# Patient Record
Sex: Male | Born: 1945 | Race: White | Hispanic: No | Marital: Married | Smoking: Never smoker
Health system: Southern US, Community
[De-identification: ages and names within clinical notes are randomized; demographics above are authoritative.]

## PROBLEM LIST (undated history)

## (undated) DIAGNOSIS — IMO0001 Reserved for inherently not codable concepts without codable children: Secondary | ICD-10-CM

## (undated) DIAGNOSIS — T7840XA Allergy, unspecified, initial encounter: Secondary | ICD-10-CM

## (undated) DIAGNOSIS — I10 Essential (primary) hypertension: Secondary | ICD-10-CM

## (undated) DIAGNOSIS — N50819 Testicular pain, unspecified: Secondary | ICD-10-CM

## (undated) DIAGNOSIS — Z789 Other specified health status: Secondary | ICD-10-CM

## (undated) DIAGNOSIS — M1990 Unspecified osteoarthritis, unspecified site: Secondary | ICD-10-CM

## (undated) DIAGNOSIS — K5790 Diverticulosis of intestine, part unspecified, without perforation or abscess without bleeding: Secondary | ICD-10-CM

## (undated) DIAGNOSIS — Z9289 Personal history of other medical treatment: Secondary | ICD-10-CM

## (undated) DIAGNOSIS — K760 Fatty (change of) liver, not elsewhere classified: Secondary | ICD-10-CM

## (undated) DIAGNOSIS — E785 Hyperlipidemia, unspecified: Secondary | ICD-10-CM

## (undated) DIAGNOSIS — R935 Abnormal findings on diagnostic imaging of other abdominal regions, including retroperitoneum: Secondary | ICD-10-CM

## (undated) DIAGNOSIS — R9389 Abnormal findings on diagnostic imaging of other specified body structures: Secondary | ICD-10-CM

## (undated) DIAGNOSIS — I358 Other nonrheumatic aortic valve disorders: Secondary | ICD-10-CM

## (undated) HISTORY — DX: Hyperlipidemia, unspecified: E78.5

## (undated) HISTORY — DX: Diverticulosis of intestine, part unspecified, without perforation or abscess without bleeding: K57.90

## (undated) HISTORY — DX: Other specified health status: Z78.9

## (undated) HISTORY — DX: Essential (primary) hypertension: I10

## (undated) HISTORY — PX: SHOULDER SURGERY: SHX246

## (undated) HISTORY — DX: Other nonrheumatic aortic valve disorders: I35.8

## (undated) HISTORY — DX: Allergy, unspecified, initial encounter: T78.40XA

## (undated) HISTORY — DX: Unspecified osteoarthritis, unspecified site: M19.90

## (undated) HISTORY — DX: Abnormal findings on diagnostic imaging of other specified body structures: R93.89

## (undated) HISTORY — DX: Testicular pain, unspecified: N50.819

## (undated) HISTORY — DX: Reserved for inherently not codable concepts without codable children: IMO0001

## (undated) HISTORY — DX: Fatty (change of) liver, not elsewhere classified: K76.0

## (undated) HISTORY — DX: Abnormal findings on diagnostic imaging of other abdominal regions, including retroperitoneum: R93.5

## (undated) HISTORY — DX: Personal history of other medical treatment: Z92.89

---

## 1993-03-01 DIAGNOSIS — D239 Other benign neoplasm of skin, unspecified: Secondary | ICD-10-CM

## 1993-03-01 HISTORY — DX: Other benign neoplasm of skin, unspecified: D23.9

## 1998-03-03 ENCOUNTER — Ambulatory Visit (HOSPITAL_COMMUNITY): Admission: RE | Admit: 1998-03-03 | Discharge: 1998-03-03 | Payer: Self-pay | Admitting: Family Medicine

## 1998-04-16 ENCOUNTER — Ambulatory Visit (HOSPITAL_COMMUNITY): Admission: RE | Admit: 1998-04-16 | Discharge: 1998-04-16 | Payer: Self-pay | Admitting: Family Medicine

## 1998-06-02 ENCOUNTER — Ambulatory Visit (HOSPITAL_COMMUNITY): Admission: RE | Admit: 1998-06-02 | Discharge: 1998-06-02 | Payer: Self-pay | Admitting: Family Medicine

## 2000-11-16 ENCOUNTER — Ambulatory Visit (HOSPITAL_COMMUNITY): Admission: RE | Admit: 2000-11-16 | Discharge: 2000-11-16 | Payer: Self-pay | Admitting: Pediatrics

## 2001-12-28 ENCOUNTER — Ambulatory Visit (HOSPITAL_BASED_OUTPATIENT_CLINIC_OR_DEPARTMENT_OTHER): Admission: RE | Admit: 2001-12-28 | Discharge: 2001-12-28 | Payer: Self-pay | Admitting: Orthopedic Surgery

## 2002-11-28 HISTORY — PX: HAND SURGERY: SHX662

## 2002-12-02 ENCOUNTER — Encounter: Payer: Self-pay | Admitting: Orthopedic Surgery

## 2002-12-02 ENCOUNTER — Ambulatory Visit (HOSPITAL_BASED_OUTPATIENT_CLINIC_OR_DEPARTMENT_OTHER): Admission: RE | Admit: 2002-12-02 | Discharge: 2002-12-02 | Payer: Self-pay | Admitting: Orthopedic Surgery

## 2002-12-02 ENCOUNTER — Encounter: Admission: RE | Admit: 2002-12-02 | Discharge: 2002-12-02 | Payer: Self-pay | Admitting: Orthopedic Surgery

## 2003-04-15 ENCOUNTER — Encounter: Payer: Self-pay | Admitting: Internal Medicine

## 2003-11-29 HISTORY — PX: NOSE SURGERY: SHX723

## 2004-05-14 ENCOUNTER — Ambulatory Visit (HOSPITAL_COMMUNITY): Admission: RE | Admit: 2004-05-14 | Discharge: 2004-05-14 | Payer: Self-pay | Admitting: Orthopedic Surgery

## 2004-05-14 ENCOUNTER — Encounter (INDEPENDENT_AMBULATORY_CARE_PROVIDER_SITE_OTHER): Payer: Self-pay | Admitting: Specialist

## 2004-05-14 ENCOUNTER — Ambulatory Visit (HOSPITAL_BASED_OUTPATIENT_CLINIC_OR_DEPARTMENT_OTHER): Admission: RE | Admit: 2004-05-14 | Discharge: 2004-05-14 | Payer: Self-pay | Admitting: Orthopedic Surgery

## 2005-06-09 ENCOUNTER — Emergency Department (HOSPITAL_COMMUNITY): Admission: EM | Admit: 2005-06-09 | Discharge: 2005-06-10 | Payer: Self-pay | Admitting: Emergency Medicine

## 2007-03-01 ENCOUNTER — Inpatient Hospital Stay (HOSPITAL_COMMUNITY): Admission: EM | Admit: 2007-03-01 | Discharge: 2007-03-02 | Payer: Self-pay | Admitting: Emergency Medicine

## 2007-03-01 ENCOUNTER — Encounter (INDEPENDENT_AMBULATORY_CARE_PROVIDER_SITE_OTHER): Payer: Self-pay | Admitting: Cardiology

## 2007-03-01 ENCOUNTER — Ambulatory Visit: Payer: Self-pay | Admitting: Cardiology

## 2007-03-01 ENCOUNTER — Encounter: Payer: Self-pay | Admitting: Vascular Surgery

## 2007-03-01 ENCOUNTER — Ambulatory Visit: Payer: Self-pay | Admitting: Vascular Surgery

## 2007-03-01 DIAGNOSIS — R9389 Abnormal findings on diagnostic imaging of other specified body structures: Secondary | ICD-10-CM

## 2007-03-01 HISTORY — DX: Abnormal findings on diagnostic imaging of other specified body structures: R93.89

## 2007-03-02 DIAGNOSIS — IMO0001 Reserved for inherently not codable concepts without codable children: Secondary | ICD-10-CM

## 2007-03-02 HISTORY — DX: Reserved for inherently not codable concepts without codable children: IMO0001

## 2007-07-12 ENCOUNTER — Ambulatory Visit (HOSPITAL_BASED_OUTPATIENT_CLINIC_OR_DEPARTMENT_OTHER): Admission: RE | Admit: 2007-07-12 | Discharge: 2007-07-12 | Payer: Self-pay | Admitting: Orthopedic Surgery

## 2008-10-10 ENCOUNTER — Ambulatory Visit: Payer: Self-pay | Admitting: Internal Medicine

## 2008-10-21 ENCOUNTER — Ambulatory Visit: Payer: Self-pay | Admitting: Internal Medicine

## 2008-10-21 HISTORY — PX: COLONOSCOPY: SHX174

## 2008-10-21 LAB — HM COLONOSCOPY: HM Colonoscopy: NORMAL

## 2009-12-08 ENCOUNTER — Ambulatory Visit: Payer: Self-pay | Admitting: Family Medicine

## 2009-12-09 ENCOUNTER — Encounter (INDEPENDENT_AMBULATORY_CARE_PROVIDER_SITE_OTHER): Payer: Self-pay | Admitting: *Deleted

## 2009-12-09 ENCOUNTER — Encounter: Admission: RE | Admit: 2009-12-09 | Discharge: 2009-12-09 | Payer: Self-pay | Admitting: Family Medicine

## 2009-12-09 DIAGNOSIS — R935 Abnormal findings on diagnostic imaging of other abdominal regions, including retroperitoneum: Secondary | ICD-10-CM

## 2009-12-09 HISTORY — DX: Abnormal findings on diagnostic imaging of other abdominal regions, including retroperitoneum: R93.5

## 2009-12-11 ENCOUNTER — Ambulatory Visit: Payer: Self-pay | Admitting: Family Medicine

## 2010-01-06 ENCOUNTER — Encounter (INDEPENDENT_AMBULATORY_CARE_PROVIDER_SITE_OTHER): Payer: Self-pay | Admitting: *Deleted

## 2010-01-08 DIAGNOSIS — N50819 Testicular pain, unspecified: Secondary | ICD-10-CM

## 2010-01-08 HISTORY — DX: Testicular pain, unspecified: N50.819

## 2010-02-01 ENCOUNTER — Ambulatory Visit: Payer: Self-pay | Admitting: Internal Medicine

## 2010-02-01 DIAGNOSIS — R142 Eructation: Secondary | ICD-10-CM

## 2010-02-01 DIAGNOSIS — R143 Flatulence: Secondary | ICD-10-CM

## 2010-02-01 DIAGNOSIS — R141 Gas pain: Secondary | ICD-10-CM | POA: Insufficient documentation

## 2010-02-01 DIAGNOSIS — R1031 Right lower quadrant pain: Secondary | ICD-10-CM | POA: Insufficient documentation

## 2010-02-02 ENCOUNTER — Ambulatory Visit: Payer: Self-pay | Admitting: Cardiology

## 2010-02-02 LAB — CONVERTED CEMR LAB
BUN: 17 mg/dL (ref 6–23)
Basophils Absolute: 0.1 10*3/uL (ref 0.0–0.1)
Basophils Relative: 0.9 % (ref 0.0–3.0)
Creatinine, Ser: 1.1 mg/dL (ref 0.4–1.5)
Eosinophils Absolute: 0.2 10*3/uL (ref 0.0–0.7)
GFR calc non Af Amer: 72.11 mL/min (ref >60)
MCHC: 33.4 g/dL (ref 30.0–36.0)
MCV: 95.4 fL (ref 78.0–100.0)
Monocytes Absolute: 0.6 10*3/uL (ref 0.1–1.0)
Neutro Abs: 2.9 10*3/uL (ref 1.4–7.7)
Neutrophils Relative %: 50.1 % (ref 43.0–77.0)
PSA: 1.95 ng/mL (ref 0.10–4.00)
Potassium: 4.3 meq/L (ref 3.5–5.1)
RBC: 4.83 M/uL (ref 4.22–5.81)
RDW: 12.2 % (ref 11.5–14.6)
TSH: 2.48 microintl units/mL (ref 0.35–5.50)
Total Bilirubin: 0.3 mg/dL (ref 0.3–1.2)

## 2010-02-16 ENCOUNTER — Ambulatory Visit: Payer: Self-pay | Admitting: Family Medicine

## 2010-04-02 ENCOUNTER — Ambulatory Visit: Payer: Self-pay | Admitting: Family Medicine

## 2010-08-12 ENCOUNTER — Ambulatory Visit: Payer: Self-pay | Admitting: Family Medicine

## 2010-08-27 ENCOUNTER — Ambulatory Visit: Payer: Self-pay | Admitting: Family Medicine

## 2010-09-16 ENCOUNTER — Ambulatory Visit: Payer: Self-pay | Admitting: Family Medicine

## 2010-10-20 ENCOUNTER — Ambulatory Visit: Payer: Self-pay | Admitting: Family Medicine

## 2010-12-28 NOTE — Letter (Signed)
Summary: New Patient letter  [name]  [address] [place], [place] [address]   Phone: [phone]  Fax: [phone]       01/06/2010 MRN: [id number]  [name] [address] [place], [place]  [address]  Dear Daniel [name],  Welcome to the Gastroenterology Division at [facility].    You are scheduled to see Dr.  Marina [name] on 02-01-10 at 1:45PM on the 3rd floor at [name], [address]  We ask that you try to arrive at our office 15 minutes prior to your appointment time to allow for check-in.  We would like you to complete the enclosed self-administered evaluation form prior to your visit and bring it with you on the day of your appointment.  We will review it with you.  Also, please bring a complete list of all your medications or, if you prefer, bring the medication bottles and we will list them.  Please bring your insurance card so that we may make a copy of it.  If your insurance requires a referral to see a specialist, please bring your referral form from your primary care physician.  Co-payments are due at the time of your visit and may be paid by cash, check or credit card.     Your office visit will consist of a consult with your physician (includes a physical exam), any laboratory testing he/she may order, scheduling of any necessary diagnostic testing (e.g. x-ray, ultrasound, CT-scan), and scheduling of a procedure (e.g. Endoscopy, Colonoscopy) if required.  Please allow enough time on your schedule to allow for any/all of these possibilities.    If you cannot keep your appointment, please call [phone] to cancel or reschedule prior to your appointment date.  This allows [place] the opportunity to schedule an appointment for another patient in need of care.  If you do not cancel or reschedule by 5 p.m. the business day prior to your appointment date, you will be charged a $50.00 late cancellation/no-show fee.    Thank you for choosing [facility] for your medical needs.  We appreciate the opportunity to care for you.  Please visit [place] at our website  to learn more about our practice.                     Sincerely,                                                             The Gastroenterology Division

## 2010-12-28 NOTE — Assessment & Plan Note (Signed)
Summary: SOME ABD PAIN/YF   History of Present Illness Visit Type: Initial Consult Primary GI MD: Yancey Flemings MD Primary Provider: Sharlot Gowda, MD Requesting Provider: Sharlot Gowda, MD Chief Complaint: Lower abdominal Pain History of Present Illness:   65 year old white male with hypertension, hyperlipidemia, atrial fibrillation, and osteoarthritis. He was last seen in November of 2009 for surveillance colonoscopy. This was negative except for diverticulosis. He presents today with a new complaint, specifically, lower abdominal pain. He has no accompanying records. He tells me that the symptoms began approximately 3 months ago. He describes lower abdominal discomfort particularly on the right. This is most prominent when bending over. He describes a knot -like sensation as well as discomfort in the right testicle. He tells me that a urinalysis was negative. He was treated empirically with ciprofloxacin for one month. This seemed to help. Subsequently had some left-sided discomfort as well as left testicular discomfort. This has resolved. He was seen by urologist who did not feel that the problem was urologic. Question of referred pain from the back. Blocks again was prescribed but this did not help. He denies any change in his bowel habits, any effect of defecation on the discomfort, bleeding, or weight loss. He continues to notice that the discomfort will occur with certain bending her sitting positions and that it is immediately relieved with changing position. Following, in the past month, he has noticed progressive bloating toward the end of the day. His chronic medical problems are stable.Marland Kitchen   GI Review of Systems    Reports abdominal pain, belching, and  bloating.     Location of  Abdominal pain: lower abdomen.    Denies acid reflux, chest pain, dysphagia with liquids, dysphagia with solids, heartburn, loss of appetite, nausea, vomiting, vomiting blood, weight loss, and  weight gain.     Reports diverticulosis.     Denies anal fissure, black tarry stools, change in bowel habit, constipation, diarrhea, fecal incontinence, heme positive stool, hemorrhoids, irritable bowel syndrome, jaundice, light color stool, liver problems, rectal bleeding, and  rectal pain. Preventive Screening-Counseling & Management  Alcohol-Tobacco     Smoking Status: never      Drug Use:  no.      Current Medications (verified): 1)  Toprol Xl 25 Mg Xr24h-Tab (Metoprolol Succinate) .Marland Kitchen.. 1 Tablet By Mouth Once Daily 2)  Aspirin 81 Mg Tbec (Aspirin) .Marland Kitchen.. 1 Tablet By Mouth Once Daily 3)  Welchol 625 Mg Tabs (Colesevelam Hcl) .... 6 Tablets By Mouth Once Daily 4)  Zetia 10 Mg Tabs (Ezetimibe) .Marland Kitchen.. 1 Tablet By Mouth Once Daily  Allergies (verified): 1)  Codeine  Past History:  Past Medical History: Diverticulosis Hypertension Arthritis Atrial Fibrillation Hyperlipidemia  Past Surgical History: Reviewed history from 01/28/2010 and no changes required. Tonsillectomy Rt. Ear Surgery  Family History: Reviewed history and no changes required. Family History of Breast Cancer: mother No FH of Colon Cancer:  Social History: Reviewed history and no changes required. Married  No children Retired Patient has never smoked.  Alcohol Use - yes Daily Caffeine Use Illicit Drug Use - no Smoking Status:  never Drug Use:  no  Review of Systems       The patient complains of arthritis/joint pain, back pain, and heart murmur.  The patient denies allergy/sinus, anemia, anxiety-new, blood in urine, breast changes/lumps, change in vision, confusion, cough, coughing up blood, depression-new, fainting, fatigue, fever, headaches-new, hearing problems, heart rhythm changes, itching, menstrual pain, muscle pains/cramps, night sweats, nosebleeds, pregnancy symptoms, shortness of breath,  skin rash, sleeping problems, sore throat, swelling of feet/legs, swollen lymph glands, thirst - excessive , urination -  excessive , urination changes/pain, urine leakage, vision changes, and voice change.    Vital Signs:  Patient profile:   65 year old male Height:      71.5 inches Weight:      204 pounds BMI:     28.16 Pulse rate:   64 / minute Pulse rhythm:   regular BP sitting:   130 / 90  (left arm)  Vitals Entered By: Milford Cage NCMA (February 01, 2010 1:53 PM)  Physical Exam  General:  Well developed, well nourished, no acute distress. Head:  Normocephalic and atraumatic. Eyes:  PERRLA, no icterus. Ears:  Normal auditory acuity. Nose:  No deformity, discharge,  or lesions. Mouth:  No deformity or lesions, dentition normal. Neck:  Supple; no masses or thyromegaly. Lungs:  Clear throughout to auscultation. Heart:  Regular rate and rhythm; no murmurs, rubs,  or bruits. Abdomen:  Soft, nontender and nondistended. No masses, hepatosplenomegaly or hernias noted. Normal bowel sounds.. No obvious inguinal hernia Msk:  Symmetrical with no gross deformities. Normal posture. Pulses:  Normal pulses noted. Extremities:  No clubbing, cyanosis, edema or deformities noted. Neurologic:  Alert and  oriented x4;  grossly normal neurologically. Skin:  Intact without significant lesions or rashes. Psych:  Alert and cooperative. Normal mood and affect.   Impression & Recommendations:  Problem # 1:  ABDOMINAL PAIN, RIGHT LOWER QUADRANT (ICD-789.03) Chronic right lower quadrant discomfort affected by positional change. This does not sound like a primary GI process. Colonoscopy 15 months ago as described. He may have strained of the inguinal ligament, but no obvious hernia on exam. He is concerned about the discomfort.  Plan: #1. General laboratories including health panel and PSA #2. Contrast-enhanced CT scan of the abdomen and pelvis to further evaluate persisting discomfort #3. If CT and laboratories unrevealing, then reassurance and time. Symptomatic treatment for his PCP would be indicated  Problem # 2:   FLATULENCE-GAS-BLOATING (ICD-787.3) nonspecific complaints of increased gas, particularly a days and. Maybe related to change of bacterial flora post antibiotic therapy  Plan: #1. Probiotic therapy for one month  Other Orders: CT Abdomen/Pelvis with Contrast (CT Abd/Pelvis w/con) TLB-CBC Platelet - w/Differential (85025-CBCD) TLB-BMP (Basic Metabolic Panel-BMET) (80048-METABOL) TLB-Hepatic/Liver Function Pnl (80076-HEPATIC) TLB-TSH (Thyroid Stimulating Hormone) (84443-TSH) TLB-PSA (Prostate Specific Antigen) (84153-PSA)  Patient Instructions: 1)  CT Abdomen/Pelvis with contrast  02/02/10 1:00 pm arrive at 12:45 pm at [facility] CT on [address]  2)  Contrast given to patient in office to drink with instructions to drink first bottle at 11:00 am and second bottle at 12 noon. 3)  Patient is to have nothing to eat  or drink 4 hours prior to CT Scan. 4)  Labs orders given to patient to go to basement now and get drawn. 5)  The medication list was reviewed and reconciled.  All changed / newly prescribed medications were explained.  A complete medication list was provided to the patient / caregiver. 6)  Copy: Dr. Sharlot Gowda

## 2010-12-28 NOTE — Consult Note (Signed)
Summary: [facility]   Imported By: Sherian Rein 02/04/2010 08:19:58  _____________________________________________________________________  External Attachment:    Type:   Image     Comment:   External Document

## 2011-03-06 DIAGNOSIS — Z9289 Personal history of other medical treatment: Secondary | ICD-10-CM

## 2011-03-06 HISTORY — DX: Personal history of other medical treatment: Z92.89

## 2011-03-15 ENCOUNTER — Institutional Professional Consult (permissible substitution) (INDEPENDENT_AMBULATORY_CARE_PROVIDER_SITE_OTHER): Payer: BLUE CROSS/BLUE SHIELD | Admitting: Family Medicine

## 2011-03-15 ENCOUNTER — Other Ambulatory Visit: Payer: Self-pay | Admitting: Family Medicine

## 2011-03-15 DIAGNOSIS — R109 Unspecified abdominal pain: Secondary | ICD-10-CM

## 2011-03-15 DIAGNOSIS — I714 Abdominal aortic aneurysm, without rupture: Secondary | ICD-10-CM

## 2011-03-16 ENCOUNTER — Ambulatory Visit
Admission: RE | Admit: 2011-03-16 | Discharge: 2011-03-16 | Disposition: A | Discharge status: Home / Self Care | Payer: BLUE CROSS/BLUE SHIELD | Source: Ambulatory Visit | Attending: Family Medicine | Admitting: Family Medicine

## 2011-03-16 ENCOUNTER — Other Ambulatory Visit: Payer: Self-pay | Admitting: Family Medicine

## 2011-03-16 DIAGNOSIS — I714 Abdominal aortic aneurysm, without rupture: Secondary | ICD-10-CM

## 2011-04-12 NOTE — Op Note (Signed)
NAME:  [name], [name] NO.:  [id number]   MEDICAL RECORD NO.:  [id number]          PATIENT TYPE:  AMB   LOCATION:  DSC                          FACILITY:  [facility]   PHYSICIAN:  Katy Fitch. Sypher, M.D. 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   OPERATION:  1. Examination of right shoulder under anesthesia.  2. Arthroscopic debridement of subscapularis, supraspinatus and      labrum.  3. Arthroscopic subacromial decompression with coracoacromial ligament      relaxation, acromioplasty, bursectomy.  4. Arthroscopic distal clavicle resection.   OPERATING SURGEON:  Josephine Igo, M.D.   ASSISTANT:  Molly Maduro Dasnoit PA-C.   ANESTHESIA:  General endotracheal supplemented by a right interscalene  block, supervising anesthesiologist Dr. Gelene Mink.   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  He has a history of shoulder pain with  significant weakness of abduction and external rotation and night pain.   Clinical examination revealed signs of AC arthropathy and impingement.  An MRI the shoulder was obtained and May 2008, revealing evidence of a  partial-thickness rotator cuff tear and unfavorable AC anatomy.   Due to a 70-month period of failure to respond to nonoperative measures,  he is brought to the operating at this time anticipating examination of  shoulder under anesthesia, diagnostic arthroscopy, subacromial  decompression and arthroscopic distal clavicle resection verses open  distal clavicle resection.   Preoperatively he was advised that we would proceed with repair of the  rotator cuff if significant pathology with a tear greater than 60% was  identified.   In the preoperative holding area, [name] requested that we inject his  left thumb CMC joint with Depo-Medrol lidocaine due to the presence of a  significant degenerative arthritis.  After informed consent, this was  added to our permit.   He is brought to the operating room at this time.   PROCEDURE:  [name] is brought to the operating room and placed in  supine position on the operating table.   Following the induction of general anesthesia by endotracheal technique,  he was carefully positioned in beach-chair position with aid of  torso  and head holder designed for shoulder arthroscopy.  1 gram of Ancef was  administered as IV prophylactic antibiotic.  The right arm and  forequarter prepped with DuraPrep and draped with impervious arthroscopy  drapes.   Examination of the shoulder under anesthesia revealed combined elevation  180 degrees, external  rotation 95 degrees, internal rotation 80 degrees  without evidence of instability.   Shoulder was distended with 20 mL of sterile saline with the anterior  spinal  needle approach followed by placement of the arthroscope through  a standard posterior viewing portal with blunt technique.  Diagnostic  arthroscopy revealed intact hyaline intra-articular cartilage surfaces  on the glenoid humeral head.  There was a variant of normal anterior  glenohumeral anatomy.  The anterior superior glenohumeral ligament was  quite distinct and did not have any significant attachment to the  anterior labrum.  The anterior middle and inferior glenohumeral  ligaments were more normal in appearance.  There was a 10% degenerative  tear of the subscapularis in the mid tendon.  There was  considerable  synovitis adjacent to the rotator interval.  The deep surface  supraspinatus inspected and found to have 25% thickness degenerative  tear with frayed tendon piled up.   An anterior portal was created under direct vision followed by placement  of a 4.5-mm suction shaver.  The subscapularis, synovitis, labrum and  supraspinatus tendons were all debrided to stable margin.   After documentation of the debridement, the scope was removed from the  glenohumeral joint and placed in the subacromial space.  There was a  florid bursitis noted.  After bursectomy, the anatomy of the  coracoacromial arch was studied.  There is a prominent distal clavicle  osteophyte and medial acromial osteophyte.   A cutting cautery was used to relax coracoacromial ligament followed by  use of the suction shaver to level the acromion to a type 1 morphology.  The distal 15 mm of clavicle was removed arthroscopically with a suction  bur.  After thorough bursectomy, the bursal side of the cuff was  inspected and found be intact.  There were no apparent complications.   [name] tolerated surgery anesthesia well.  After removal of the  arthroscopic equipment the portals repaired with mattress suture of 3-0  Prolene.  The wounds were dressed with sterile gauze and paper tape.   [name] will be discharged to the care of his family.  He will return  to our office for follow-up in 24 hours for dressing change to Tegaderm  dressings and will be instructed in his range of motion exercise  program.  We anticipate a postoperative rehabilitation program  emphasizing active assisted range of motion, followed by weight training  in 6 weeks.      Katy Fitch Sypher, M.D.  Electronically Signed     RVS/MEDQ  D:  07/12/2007  T:  07/13/2007  Job:  [id number]

## 2011-04-15 NOTE — Op Note (Signed)
NAME:  [name], [name] NO.:  [id number]   MEDICAL RECORD NO.:  [id number]                   PATIENT TYPE:  AMB   LOCATION:  DSC                                  FACILITY:  [facility]   PHYSICIAN:  Katy Fitch. Naaman Plummer., M.D.          DATE OF BIRTH:  04/15/1946   DATE OF PROCEDURE:  05/14/2004  DATE OF DISCHARGE:                                 OPERATIVE REPORT   PREOPERATIVE DIAGNOSIS:  Ganglion cyst overlying A1-A2 pulley junction left  long finger with prior resection of ganglion from this region in January  2003.   POSTOPERATIVE DIAGNOSIS:  Ganglion cyst overlying A1-A2 pulley junction left  long finger with prior resection of ganglion from this region in January  2003.   OPERATION:  Re-exploration of left long finger and excision of a flexor  sheath ganglion followed by extensive debridement of flexor retinacular  tissues.   SURGEON:  Katy Fitch. Sypher, M.D.   ASSISTANT:  Molly Maduro Dasnoit, P.A.-C.   ANESTHESIA:  0.25% Marcaine and 2% lidocaine metacarpal head level block  left long finger supplemented with IV sedation.   ANESTHESIOLOGIST:  Sheldon Silvan, M.D.   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  He has a history of  developing a painful mass in his left long finger in 2002.  Subsequently, he  was scheduled for excisional biopsy in January 2003.  He had complete relief  of his symptoms for approximately one year and, thereafter, developed some  tenderness and a recurrent mass in the left long finger overlying the A1-A2  pulley junction.  He returned for consultation recently requesting repeat  excisional biopsy.  On exam, he was noted to have a 1 cm in diameter mass  directly over the base of the proximal phalanx.  This was tender to touch.  After informed consent, he is brought to the operating room at this time.   PROCEDURE:  [name] is brought to  the operating room and placed on  supine position on the operating table.  Following light sedation, the left  arm was prepped with Betadine solution and sterilely draped.  A pneumatic  tourniquet was applied to the proximal arm.  Following exsanguination of the  left arm with an Esmarch bandage, the arterial tourniquet was inflated to  230 mmHg.  The procedure commenced with resection of the prior surgical  scar.  The wound was subsequently explored in the subcutaneous region with  scissor dissection, carefully identifying the ulnar digital nerve and  artery.  The ganglion had recurred right at the distal A1/proximal A2  junction.  This was circumferentially dissected and removed with a small  portion of the retinacular sheath.  A rongeur was then used to clean the  surface of the pulleys of all collagenous tissues until the normal appearing  retinacular fibers were identified.  The  wound was then irrigated and closed  with intradermal 4-0 Prolene suture.  A compression dressing was applied  with Xeroform, sterile gauze, and Coban.   For aftercare, [name] is advised to elevate his hand for approximately  one week.  He will work on range of motion exercises immediately.  He will  change his dressing at three days and return to the office for follow up in  seven days for suture removal.                                               Katy Fitch. Naaman Plummer., M.D.    RVS/MEDQ  D:  05/14/2004  T:  05/14/2004  Job:  [id number]

## 2011-04-15 NOTE — H&P (Signed)
NAME:  [name], [name] NO.:  [id number]   MEDICAL RECORD NO.:  [id number]          PATIENT TYPE:  INP   LOCATION:  1828                         FACILITY:  [facility]   PHYSICIAN:  Hollice Espy, M.D.DATE OF BIRTH:  06-02-1946   DATE OF ADMISSION:  02/28/2007  DATE OF DISCHARGE:                              HISTORY & PHYSICAL   The patient's PCP is Dr. Jethro Bastos.   CHIEF COMPLAINT:  Syncope.   HISTORY OF PRESENT ILLNESS:  The patient is a 65 year old white male  with past medical history of reported Wolff-Parkinson-White syndrome,  hypertension and hyperlipidemia who presents after a syncopal episode.  The patient previously has been well.  About 3 years ago, he started  having severe palpitations radiating up to his neck with feeling of  pauses.  He was evaluated by Dr. Armanda Magic of cardiology and  reportedly a normal echocardiogram at the time but was told that he had  Wolff-Parkinson-White syndrome.  He has since been well with no  complaints.  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  This afternoon, he  had taken a Vicodin that he had from a previous prescription after he  had strained his back but this was taken around 2:30.  He had also drank  a lot of caffeine throughout the day and had finished with a large  cappuccino he said which contained even more caffeine.  He said he  started feeling lightheaded and the next thing he knew he passed out.  Now, what is concerning is that the report stated that about 30 minutes  later he woke up in the ambulance.  He was unresponsive for 30 minutes.  There was no report that the patient stopped breathing or that he became  pulseless.  There was no report of that.   The patient was brought into the emergency room.  His EKG shows actually  normal sinus rhythm with no pauses, prolonged intervals otherwise.  His  labs are completely  unremarkable except for a mildly creatinine elevated  at 1.3.  She has been put on telemetry and since then it showed no signs  of arrhythmia.  Currently, the patient is doing well.  Denies any  headaches, vision changes, dysphagia, chest pain, palpitations,  shortness of breath, wheeze, cough, abdominal pain, hematuria, dysuria,  constipation, diarrhea, focal extremity numbness, weakness or pain.  Review of systems is otherwise negative.   The patient's past medical history includes hypertension, hyperlipidemia  and a previous history of Wolff-Parkinson-White syndrome.   MEDICATIONS:  1. The patient is on metoprolol 50 daily.  2. Simvastatin 80 daily.  3. Zetia 10.   ALLERGIES:  He is allergic to CODEINE.   SOCIAL HISTORY:  He denies any tobacco, alcohol or drug use.   FAMILY HISTORY:  Noncontributory.   PHYSICAL EXAMINATION:  The patient's vitals on admission:  Temperature  97, heart rate 62, blood pressure 139/82, respirations 20, O2 saturation  100% on room air.  In general, he is alert and oriented x3 and in no  acute distress.  HEENT is normocephalic and atraumatic.  His mucus  membranes are moist.  He has no carotid bruits.  Heart is a regular rate  and rhythm, S1-S2 with a 2/6 systolic ejection murmur.  Lungs are clear  to auscultation bilaterally.  Abdomen is soft, nontender, and  nondistended, positive bowel sounds.  Extremities have no clubbing,  cyanosis, or edema.  He has 2+ pulses.   LABS:  White count 6.3, H&H 14.8 and 42.3, MCV of 89, platelet count  140, no shift.  Sodium 140, potassium 2.4, chloride 107, bicarb 27, BUN  21, creatinine 1.3, glucose 133.  CPK 180, MB 6.7, troponin I less than  0.05.   ASSESSMENT/PLAN:  1. Syncopal episode:  The time assessment said the patient was      unconscious for 30 minute spell is accurate.  This is certainly      concerning especially given the previous history of arrhythmia and      the patient took a large amount of  caffeine.  We will check a 2-D      echocardiogram, serial set of cardiac enzymes and ask [facility]      Cardiology to see the patient.  In the meantime, we will hold his      metoprolol, keep him on telemetry and check orthostatics.  2. Hypertension:  Hold his metoprolol.  3. Hyperlipidemia:  Continue Simvastatin and Zetia.  4. Renal insufficiency, mild:  We will hydrate.      Hollice Espy, M.D.  Electronically Signed     SKK/MEDQ  D:  03/01/2007  T:  03/01/2007  Job:  [id number]   cc:   Armanda Magic, M.D.  Jethro Bastos, M.D.

## 2011-04-15 NOTE — Op Note (Signed)
NAME:  Daniel [name], Daniel [name] NO.:  [id number]   MEDICAL RECORD NO.:  [id number]                   PATIENT TYPE:  AMB   LOCATION:  DSC                                  FACILITY:  [facility]   PHYSICIAN:  Feliberto Gottron. Turner Daniels, M.D.                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   OPERATION PERFORMED:  1. Left shoulder arthroscopic anteroinferior acromioplasty.  2. Distal clavicle excision.  3. Debridement of external leaflet, supraspinatus, rotator cuff ear, 20%.   SURGEON:  Feliberto Gottron. Turner Daniels, M.D.   FIRST ASSISTANT:  Erskine Squibb B. Roberts, P. A.   ANESTHESIA:  Interscalene block with general endotracheal.   ESTIMATED BLOOD LOSS:  Minimal.   FLUIDS REPLACED:  Eight-hundred cubic centimeters crystalloid.   DRAINS PLACED:  None.   TOURNIQUET TIME:  None.   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  He has failed  conservative treatment with anti-inflammatory medicines that initially  worked for a few months, physical therapy and got temporary relief from  cortisone injections, most importantly, a differential injection including  the [facility] joint, which gave about 90% relief without the [facility] joint, about 70%  relief.  In any event his pain has returned.  Plane radiographs showed AC  joint arthritis and a subacromial spur.  He is a candidate for arthroscopic  evaluation and treatment of his left shoulder to decrease pain and increase  function.   DESCRIPTION OF OPERATION:  The patient was  identified by armband, was  brought to the operating room at [facility], appropriate  anesthetic monitors were attached and interscalene block anesthesia induced  to the left upper extremity followed by general endotracheal anesthesia.  He  was then placed in the beach chair position and the left upper extremity  prepped and draped in the usual sterile fashion from the wrist to the  hemithorax.   Using a #11 blade standard portals were made 1.5 mm anterior to the [facility]  joint, lateral to the junction of the middle and posterior thirds of the  acromion and posterior to the posterolateral cornu of the acromion process.  The inflow was placed anteriorly, the arthroscope laterally and a 4.2 great  white sucker shaver posteriorly allowing subacromial bursectomy, and  evaluation of the subacromial spur as well as the AC joint.  The subacromial  spur was outlined and removed with a 4.5 hooded Vortex bur creating a type I  acromion and distal clavicle excision was then performed using the  4.5  hooded Vortex bur and then swapping portals bringing the bur anteriorly to  complete the superior distal centimeter resection.  Satisfied with the  decompression we then carefully evaluated the rotator cuff.  The  supraspinatus insertion had partial thickness tearing about 20% and was  debrided as smoothly as possible with a 4.2 great white sucker shaver.  The  arthroscope was then inserted into the glenohumeral joint utilizing the  posterior portal  where we evaluated the articular cartilage and found it to  be in good condition with Grade 2 chondromalacia.  The biceps anchor was  intact.  The labrum had some degenerative tearing that was debrided back to  stable margin.  The internal leaflets of the rotator cuff were noted to be  intact.   The shoulder was then washed out with normal saline solution.  The  arthroscopic instruments were removed.  A dressing of Xeroform, four-by-four  dressing,  sponges and paper tape applied followed by a sling.   The patient was then awakened and taken to the recovery room without  difficulty.                                                 Feliberto Gottron. Turner Daniels, M.D.    Ovid Curd  D:  12/02/2002  T:  12/02/2002  Job:  [id number]

## 2011-04-15 NOTE — Consult Note (Signed)
NAME:  Daniel [name], Daniel [name] NO.:  [id number]   MEDICAL RECORD NO.:  [id number]          PATIENT TYPE:  INP   LOCATION:  4735                         FACILITY:  [facility]   PHYSICIAN:  Armanda Magic, M.D.     DATE OF BIRTH:  06-09-1946   DATE OF CONSULTATION:  03/01/2007  DATE OF DISCHARGE:                                 CONSULTATION   REFERRING PHYSICIAN:  Jethro Bastos, M.D.   CHIEF COMPLAINT:  Syncope.   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  He has no  known history of coronary disease.  He does have a history of recent  palpitations with no evidence of significant arrhythmias on event  monitor.  He was admitted yesterday after an episode of syncope.  Apparently, he was at a ball field working and had prodromal symptoms  including diaphoresis, then nausea, then blurred vision, then dizziness  and lightheadedness, and lost consciousness.  He thinks for about 30  minutes.  He is not really sure but awakened in the ambulance.  There  was no loss of bowel or bladder function.  Apparently, he had an  excessive amount caffeine intake that day.  He had gotten up and drank  an entire pot of coffee.  He then had a [place] after that, and  then was working that afternoon at the ball field.  It was cold and he  had somebody go and get him a large couple of Starbucks coffee which he  immediately drank fairly quickly.  He had also taken a Vicodin earlier  that day because of some pain in his shoulders and had gotten two  allergy shots as well.  He said he had some palpitations the day prior  to the syncopal event, but not at the time of the syncopal event.  He  did have some palpitations overnight and the heart monitor showed sinus  tachycardia to 121 beats per minute.  EKG on admission showed sinus  rhythm at 62 beats per minute with LVH.  No ST-T wave abnormalities.  No  delta  waves.  A 2-D echocardiogram done in 2006, showed normal LV  function, EF 70%, with trivial MR and TR.  We are now asked to evaluate  for episode of syncope.   PAST MEDICAL HISTORY:  1. WPW with concealed accessory bypass tract by EKG.  2. Palpitations with skipped beats via event monitor.  3. Systolic heart murmur with a history of MR and TR in the past.  4. Normal LV function.  5. Hypokalemia, this admission, replenished.  6. Dyslipidemia.  7. Questionable hypertension.   ALLERGIES:  CODEINE.   MEDICATIONS:  1. Metoprolol ER 50 mg a day.  2. Zocor 80 mg a day.  3. Zetia 10 mg a day.  4. Multivitamin.  5. Fish oil tablets 500 mg, 2 tablets a day.  6. Aspirin 81 mg 4-5 times weekly.   FAMILY HISTORY:  His father died at 43.  He was status post a permanent  pacemaker and rheumatoid arthritis.  His mom is alive with dementia and  breast CA.   SOCIAL HISTORY:  He is married with no children.  He lives with his  wife.  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  He denies any tobacco or drug use.  He occasionally  drinks alcohol up to 12 ounce beer 1-2 times per week, and three 5 ounce  glasses of wine per week.   REVIEW OF SYSTEM:  Other than stated in the HPI is negative.   PHYSICAL EXAMINATION:  VITAL SIGNS:  Blood pressure is 131/92, pulse 96,  respirations 18, O2 saturation is 92% on room air.  I's and O's minus  700.  GENERAL:  This is a well-developed, well-nourished male in no distress.  HEENT:  Benign.  NECK:  Supple without lymphadenopathy.  Carotid upstrokes plus two  bilaterally.  No bruits.  LUNGS:  Clear to auscultation throughout.  HEART:  Regular rate and rhythm with a 2/6 systolic murmur at the right  upper sternal border radiating to left lower sternal border.  Normal S1-  2.  ABDOMEN:  Soft, nontender, nondistended, normoactive bowel sounds.  No  hepatosplenomegaly.  EXTREMITIES:  No edema.   LABORATORY:  Sodium  146, potassium 4, chloride 108, bicarb 29, BUN 17,  creatinine 1.18, glucose 98.  White cell count 6.7, hemoglobin 14,  hematocrit 41, platelet count 139.  CPK 421 and 464, MB 7.6 and 8,  troponin 0.01 x2.  EKG shows a normal sinus rhythm at 62 beats per  minute with no ST-T wave abnormalities.  QTC is 414 milliseconds.   ASSESSMENT:  1. Syncope of questionable etiology.  Need to rule out a primary      arrhythmia given the patient's history of Wolff-Parkinson-White      syndrome.  He did have a significant amount of caffeine that day,      also could possibly be due to vasovagal episode.  2. Hypokalemia, resolved.  3. Wolff-Parkinson-White syndrome without any delta waves on EKG.  4. History of palpitations with skipped beats via event monitor in the      past but no significant arrhythmias.  5. Normal left ventricular function.  Ejection fraction 70% by      echocardiogram in 2006.  6. Question of hypertension, controlled.  7. Dyslipidemia.  8. Systolic murmur with history of trivial mitral regurgitation and      tricuspid regurgitation.  9. Sinus tachycardia, resolved.  10.Elevated CPK-MB, but normal troponin of questionable etiology.   PLAN:  1. Will check a 2-D echocardiogram to re-evaluate LV function.  2. Continue to cycle cardiac enzymes.  3. Continue metoprolol.  4. A stress Cardiolite study in the morning to rule out ischemia.  5. Bilateral carotid artery Dopplers to rule out carotid arterial      disease.  6. [place] EP consult for further recommendations on working up      syncope in a patient with a history of WPW.      Armanda Magic, M.D.  Electronically Signed     TT/MEDQ  D:  03/01/2007  T:  03/01/2007  Job:  [id number]   cc:   Jethro Bastos, M.D.

## 2011-04-15 NOTE — Consult Note (Signed)
NAME:  Daniel, [name] NO.:  [id number]   MEDICAL RECORD NO.:  [id number]          PATIENT TYPE:  INP   LOCATION:  4735                         FACILITY:  [facility]   PHYSICIAN:  Maple Mirza, PA   DATE OF BIRTH:  1946/11/18   DATE OF CONSULTATION:  03/01/2007  DATE OF DISCHARGE:                                 CONSULTATION   PRIMARY CARE PHYSICIAN:  Dr. Dorothe Pea.   CARDIOLOGIST:  Dr. Armanda Magic.   ELECTROPHYSIOLOGIST:  Dr. Sherryl Manges.   ALLERGIES:  THIS PATIENT HAS ALLERGY TO CODEINE.  HE ALSO HAS SEASONAL  ALLERGIES.   HISTORY OF PRESENT ILLNESS:  Daniel [name] is a 65 year old male with a  diagnosis of WPW.  There is no pre-excitation slurring on  electrocardiograms.  He did wear an event monitor for Dr. Mayford Knife in  August 2006, which showed sinus bradycardia with premature atrial  complexes.  In the past, he has had no history of tachy palpitation  causing presyncope, although he has had at least two prior episodes of  syncope many years ago.   He mentions that at rest he can sometimes have a fluttery feeling that  is very transient, lasts only seconds.  He does not have any dizziness  or chest discomfort with this.  He never breaks out into a rapid rhythm.  Sometimes he has a feeling that he is skipping beats as well.   Yesterday he as at work at his job as a Engineer, technical sales at a [employer].  His day had been a full one.  He had taken a walk around [facility] with his two dogs.  They had gotten muddy and he had washed them.  He had a [place] sandwich and beer for lunch.  He had allergy shots  during the day for his seasonal allergies.  For left shoulder  discomfort, he took a Vicodin at 3:30.  He had some soda during the day.  Apparently for breakfast he had consumed a whole pot of coffee.  After  dinner in the evening at the ball game, watching people file in at about  8:00 he had a Starbucks coffee.  It was while watching these people file  in  and after the coffee that he started feeling dizzy, sweaty, flushed,  hot, sick to his stomach and nauseated.  The feeling was a slowly  swelling feeling.  He felt that he had better sit down, and he did so,  and then suddenly passed out.  He is vaguely aware of events stretched  out on the ground as people adjusted clothing.  He was aware of being  placed in the ambulance, asked if he had chest pain.  He was aware that  light had been shone in his eyes, aware of the flashing red lights of  the ambulance.  About the time that the emergency medical services folks  had asked him how he was doing and shining light in his eyes, he began  to come around.  He had no postictal symptoms.  He did not lose control  of bladder or  bowel.  He had no tongue biting or convulsive activity.  In fact, actually awoke and felt very fine.  No residual nausea.  No  dizziness.  No cloudiness of mentation.  He was transported to [facility].  His laboratory finding of mild consequence was a  potassium of 3.4, which was replenished.  He also had elevated CK values  of 464, then 425, although his troponin I studies were 0.01 and 0.01.   The patient was diagnosed with WPW in the [facility]	[place] about 1969 or  1970.  I had stated above that there was no pre-excitation on his  electrocardiograms here.  It is possible, however, he did exhibit  antegrade pre-excitation back in 1969 or 1970.  Dr. Graciela Husbands mentions that  oftentimes antegrade pre-excitation will resolve.  However, there is  still a possibility often of retrograde accessory pathway maintenance,  which could lead to orthodromic reentry tachycardias.   The patient does relate that he had a spell during a vigorous workout at  age 76.  He had done several bench presses with increasing weight added,  some floor exercises, during which he felt sick, sweaty, and nauseated.  His gym supervisors laid him down and eventually the feeling passed, he  recovered, he  got up, felt fine, but he did not continue exercising, in  fact he went home.  At [facility] he had admittance enzymes,  which as mentioned above, troponin I of 0.01 and 0.01.  He had a CT of  the head, which showed no evidence of acute intracranial abnormality.  There is mild atrophy.  He had an echocardiogram March 01, 2007, which  showed ejection fraction of 60%, no left ventricular wall motion  abnormalities, mild aortic stenosis, trivial mitral regurgitation,  trivial tricuspid regurgitation.  This compares with an echocardiogram  taken in July of 2006 when the ejection fraction was 70% and no left  ventricular wall motion abnormalities.  Carotid Doppler ultrasounds are  now being taken at the time of this dictation and will be presented  later.   PAST MEDICAL HISTORY:  Includes:  1. WPW diagnosed in 1969-1970 in the [facility]	[place].  2. Dyslipidemia.  3. Borderline hypertension.  He is treating both dyslipidemia and      hypertension.   PAST SURGICAL HISTORY:  Includes left rotator cuff arthroscopic repair  about 7 years ago.   MEDICATIONS:  Include:  1. Low dose metoprolol 50 mg daily.  2. Statin.  The patient relates it is simvastatin, but the dose is      unknown.  3. Baby aspirin daily.   SOCIAL HISTORY:  The patient is married, lives in [place].  He works  for the [employer].  He is avid with exercise.  He works  out in [employer] with [employer] and treadmill.  He also bikes a 14-mile loop  vigorously and without any chest discomfort or chest pain.  On the  telemetry monitors, he is in sinus rhythm.  Dr. Graciela Husbands notes that he goes  from a resting sinus rhythm of about 70 to 120 beats a minute quite  rapidly, a possible indication of POTS.   IMPRESSION:  1. Syncope with prolonged prodrome, possibly indicative of vasovagal      event, especially with regard to the amounts of caffeine, soda, and     with the addition of the possible effect of injections  for seasonal      allergies.  2. Hypokalemia, which is resolved.  3. Wolff-Parkinson-White.  No pre-excitation, which does not rule out      retrograde accessory pathway.  4. History of fluttery feeling very transient.  5. Event monitor showing sinus rhythm, sinus bradycardia, and      premature atrial contractions.  6. Echocardiogram March 11, 2007.  Ejection fraction of 60%.  No left      ventricular wall motion abnormalities.  7. Borderline hypertension on metoprolol.  8. Dyslipidemia on simvastatin.  9. 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  10.Elevated CKs of unknown etiology.   PLAN:  1. The plan will be to obtain orthostatic measurements to confirm or      rule out POTS.  2. I am look forward to a stress Myoview study to be done on March 02, 2007.  The patient may eventually qualify for a loop recorder.      Maple Mirza, PA     GM/MEDQ  D:  03/01/2007  T:  03/01/2007  Job:  [id number]

## 2011-04-15 NOTE — H&P (Signed)
NAME:  Daniel [name], Daniel [name] NO.:  [id number]   MEDICAL RECORD NO.:  [id number]          PATIENT TYPE:  INP   LOCATION:  1828                         FACILITY:  [facility]   PHYSICIAN:  Hollice Espy, M.D.DATE OF BIRTH:  12/21/1945   DATE OF ADMISSION:  02/28/2007  DATE OF DISCHARGE:                              HISTORY & PHYSICAL   The patient's PCP is Dr. Jethro Bastos.  Consultants on this case  are Dr. Armanda Magic, cardiology.   CHIEF COMPLAINT:  Syncope.   HISTORY OF PRESENT ILLNESS:  The patient is a 65 year old white male  with a past medical history of Wolff-Parkinson-White syndrome,  hypertension and hyperlipidemia who presents after a syncopal episode.  Apparently 3 years ago, he had a previous episode where he started  having severe palpitations and the sensation that his heart would  suddenly pause and then restart.  He was evaluated by cardiology, Dr.  Mayford Knife, at that time and he had an essentially normal echocardiogram and  otherwise normal evaluation.  It is unclear if at that time he was then  diagnosed with Wolff-Parkinson-White syndrome.  Nevertheless, he has  been doing well with no complaints.  He has been on metoprolol for  hypertension in the past year.  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  Prior to this, he had been having a recent sprained back and had taken a  Vicodin about 6 hours before the incident.  All day long, he has been  drinking more caffeine than he usually takes, and then prior to the  incident, drank a large cappuccino with lots of caffeine in it.  The  patient states that all of a sudden then he started feeling lightheaded  and not well.  The next thing he knew he passed out and woke up in the  ambulance.  What is concerning is that the documentation by the  paramedics that the patient was likely unresponsive and unconscious for  about 30 minutes.   There are no reports that his heart stopped or that  he stopped breathing.  However, he was unconscious for approximately 30  minutes.  He, after waking up, appeared to be relatively alert and  oriented but had no recollection during the time he was unconscious.   He was brought in to the emergency room.  An EKG was done which actually  showed a normal sinus rhythm with no evidence of any ST elevations or  prolonged intervals.  He had cardiac enzymes which were normal and the  rest of his labs were noted only for a slightly elevated creatinine of  1.3 but he had a normal BUN, normal electrolytes and normal cardiac  markers.  Currently, the patient is doing well.  He denies any  headaches, vision changes, dysphagia, chest pain, palpitations,  shortness of breath, wheeze, cough, abdominal pain, hematuria, dysuria,  constipation, diarrhea, focal extremity numbness, weakness or pain.  His  review of systems is otherwise negative.   The patient's past medical history includes hyperlipidemia, hypertension  and Wolff-Parkinson-White disease.   MEDICATIONS:  1. He is on metoprolol 50.  2. Simvastatin 80.  3. Zetia 10.   SOCIAL HISTORY:  He denies any tobacco, alcohol or drug use.  He is  allergic to CODEINE.   FAMILY HISTORY:  Noncontributory.   PHYSICAL EXAMINATION:  The patient's vitals on admission:  Temperature  97, heart rate 62, blood pressure 179/82, respirations 20, O2 saturation  100% on room air.  In general, the patient is alert and oriented x3 in  no apparent distress.  HEENT is normocephalic and atraumatic.  His mucus  membranes are moist.  He has no carotid bruits.  Heart regular rate and  rhythm, S1-S2.  He has a 2/6 systolic ejection murmur.  Lungs clear to  auscultation bilaterally.  Abdomen is soft, nontender, and nondistended.  Positive bowel sounds.  Extremities show no clubbing, cyanosis, or  edema, 2+ pulses.   LAB WORK:  White count 6.3, no shift.  H&H 14.8 and  42.3, MCV of 89,  platelet count of 140.  Sodium 140, potassium 3.4, chloride 107, bicarb  44, BUN 21, creatinine 1.3, glucose 133.  Cardiac markers:  CPK 180, MB  6.7, troponin I less than 0.05.  Chest x-ray was unremarkable.  EKG is  normal sinus rhythm as dictated in HPI.   ASSESSMENT/PLAN:  1. Syncopal episode:  If his time frame is accurate and the patient      was unresponsive for 30 minutes, this is certainly concerning.      This may be a possibility of increase stress and demand on his      heart with tachycardia from caffeine leading to an accessory      pathway from his Wolff-Parkinson-White but of course I will      certainly defer to cardiology in terms of evaluation and treatment.      We will order a 2-D echocardiogram on the patient.  In the      meantime, hold his metoprolol, check orthostatic blood pressures      and keep him on telemetry.  Cardiology will see him in the morning.      Also check serial set of cardiac enzymes.  2. Hypokalemia:  Replace with K-Dur.  3. Hyperlipidemia:  Continue his oral medications.  4. Hypertension:  Hold his metoprolol for now.      Hollice Espy, M.D.  Electronically Signed     SKK/MEDQ  D:  03/01/2007  T:  03/01/2007  Job:  [id number]   cc:   Armanda Magic, M.D.  Jethro Bastos, M.D.

## 2011-04-15 NOTE — Op Note (Signed)
[place]  Patient:    Daniel [name], KEMPNER Visit Number: [id number] MRN: [id number]          Service Type: DSU Location: [facility] Attending Physician:  Susa Day Dictated by:   Katy Fitch Naaman Plummer., M.D. Proc. Date: 12/28/01 Admit Date:  12/28/2001                             Operative Report  PREOPERATIVE DIAGNOSIS:  Enlarging mass, palmar aspect left long finger flexor sheath overlying the A2 pulley, ulnar aspect.  POSTOPERATIVE DIAGNOSIS:  Enlarging mass, palmar aspect left long finger flexor sheath overlying the A2 pulley, ulnar aspect.  PROCEDURE:  Excision of a flexor sheath ganglion, left long finger, from A2 pulley.  SURGEON:  Katy Fitch. Sypher, Montez Hageman., M.D.  ASSISTANT:  Jonni Sanger, P.A.  ANESTHESIA:  Marcaine 0.25% and 2% lidocaine metacarpal head-level block, left long finger, supplemented by IV sedation supervised by the anesthesiologist, Janetta Hora. Gelene Mink, M.D.  INDICATION:  Daniel [name] is a 65 year old man who presented for evaluation and management of a painful mass involving the right long finger flexor sheath.  He had a mass measuring approximately 8-9 mm in diameter on the ulnar aspect of his A2 pulley that was causing compression of his ulnar neurovascular bundle.  After informed consent, he is brought to the operating room at this time anticipating anesthesia in the form of a digital block and sedation.  DESCRIPTION OF PROCEDURE:  Daniel [name] was brought to the operating room and placed in the supine position on the operating table.  Following light sedation, the left arm was prepped with Betadine soap and solution and sterilely draped.  Marcaine 0.25% and 2% lidocaine were infiltrated at the metacarpal head level to obtain a digital block.  Following exsanguination of the limb with an Esmarch bandage, an arterial tourniquet on the proximal brachium was inflated to 220 mmHg.  The  procedure commenced with an oblique incision directly over the mass.  Subcutaneous tissues were carefully divided, taking care to identify the flexor sheath, the ulnar proper digital nerve, and the ulnar proper digital artery.  Blunt retractors were placed to protect the neurovascular structures, followed by excision of the cyst.  It was initially drained of its contents, followed by removal of the soft tissue envelope and tenosynovium involving the cyst.  No masses or other predicaments were noted within the flexor sheath.  Bleeding points were electrocauterized with bipolar current, followed by repair of the skin with interrupted sutures of 5-0 nylon.  A pressure dressing was applied with Xeroflo, sterile gauze, and Coban. Dictated by:   Katy Fitch Naaman Plummer., M.D. Attending Physician:  Susa Day DD:  12/28/01 TD:  12/29/01 Job: [id number]

## 2011-04-18 ENCOUNTER — Other Ambulatory Visit: Payer: Self-pay | Admitting: Family Medicine

## 2011-05-16 ENCOUNTER — Other Ambulatory Visit: Payer: Self-pay | Admitting: Dermatology

## 2011-06-06 ENCOUNTER — Other Ambulatory Visit: Payer: Self-pay | Admitting: Dermatology

## 2011-07-25 ENCOUNTER — Encounter: Payer: Self-pay | Admitting: Family Medicine

## 2011-08-02 ENCOUNTER — Telehealth: Payer: Self-pay | Admitting: Internal Medicine

## 2011-08-02 NOTE — Telephone Encounter (Signed)
Spoke with pt and explained the process of switching physicians within the group. Explained to pt that he could wind up without a provider in the practice if he was not taken on by Dr. Jarold Motto. Pt states he will go to another group and seek a second opinion. Pt states that he still has abdominal pain and has not been able to find out the cause of the pain. Pt wants a second opinion for the abdominal pain.

## 2011-08-02 NOTE — Telephone Encounter (Signed)
He is welcome to obtain a second opinion. It might be helpful if he gets his old records in advance of a second opinion

## 2011-08-02 NOTE — Telephone Encounter (Signed)
Pt was last seen by Dr. Marina Goodell 02/01/10 for right lower quadrant abdominal pain, flatulence, and gas. CT scan was performed that showed Hepatic Steatosis, no evidence of mass, inflammatory process, or other acute findings. For flatulence pt was prescribed a probiotic therapy for one month.  Pt last colon was 10/21/08.

## 2011-08-03 ENCOUNTER — Telehealth: Payer: Self-pay | Admitting: Family Medicine

## 2011-08-03 NOTE — Telephone Encounter (Signed)
Is this ok?

## 2011-08-03 NOTE — Telephone Encounter (Signed)
Faxed referral to [facility]

## 2011-08-13 ENCOUNTER — Other Ambulatory Visit: Payer: Self-pay | Admitting: Family Medicine

## 2011-09-12 LAB — BASIC METABOLIC PANEL
Calcium: 9.5
GFR calc Af Amer: >60
GFR calc non Af Amer: 58 — ABNORMAL LOW
Sodium: 141

## 2011-09-12 LAB — POCT HEMOGLOBIN-HEMACUE
Hemoglobin: 16.6
Operator id: 123881

## 2011-10-07 ENCOUNTER — Encounter (INDEPENDENT_AMBULATORY_CARE_PROVIDER_SITE_OTHER): Payer: Self-pay | Admitting: Surgery

## 2011-10-07 ENCOUNTER — Encounter (INDEPENDENT_AMBULATORY_CARE_PROVIDER_SITE_OTHER): Payer: Self-pay

## 2011-10-13 ENCOUNTER — Ambulatory Visit (INDEPENDENT_AMBULATORY_CARE_PROVIDER_SITE_OTHER): Payer: BLUE CROSS/BLUE SHIELD | Admitting: Surgery

## 2011-10-13 ENCOUNTER — Encounter (INDEPENDENT_AMBULATORY_CARE_PROVIDER_SITE_OTHER): Payer: Self-pay | Admitting: Surgery

## 2011-10-13 DIAGNOSIS — R1031 Right lower quadrant pain: Secondary | ICD-10-CM

## 2011-10-13 NOTE — Patient Instructions (Signed)
1.  Given copy of CT scan and book on hernias.  2.  Will recheck in 4 months.  If no better and nothing on exam, will repeat CT scan.  3.  I'll copy my notes to Drs. Lalonde and Medoff.

## 2011-10-13 NOTE — Progress Notes (Addendum)
[facility]  Ovidio Kin, MD,  FACS [address] [place]  [address] [place], [place]    [address] Phone:  [phone] FAX:  [phone]  ASSESSMENT AND PLAN: 1.  Right lower abdominal pain, questionable hernia.  He has some asymmetry of his rectus muscles where his right rectus is more anterior.  I do not feel an inguinal or abdominal wall hernia.  He does have a high right testicle.  I reviewed his evaluation.  I gave him a copy of his CT scan from March 2011.  The plan is for me to see him back in 4 months.  If he is better, he will cancel the appointment.  If he is no better, I can re-examine him for a hernia.  If I find nothing on exam, we will probably repeat the abd CT scan (it will have been 2 years since the last abd/pelvic CT).  [I got a note from Dr. Stephens Shire on 11/07/2011 that he is convinced the patient has a hernia.  I am less certain.] 2.  Hypercholesterolemia. 3.  Murmur.  Saw Dr. Erlene Quan.  To do echo. 4.  Hypertension. 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  Chief Complaint  Patient presents with  . Other    Eval of RIH   REFERRING PHYSICIAN:   Ritta Slot, MD  HISTORY OF PRESENT ILLNESS: Daniel [name] is a 65 y.o. (DOB: 04-Feb-1946)  white male whose primary care physician is Carollee Herter, MD, MD and comes to me today for right inguinal hernia.  The patient noticed abdominal pain over one year ago. He had an ultrasound of his abdomen and a CT scan of his abdomen in the spring of 2011. This showed some hepatic steatosis, but no other abnormality.  He continued to have pressure in his right lower quadrant. He complained of shooting pain to his right testicle for about one month. He saw his primary care physician, Dr. Sharlot Gowda, who put him on Cipro for possible epididymitis. The patient has some improvement in his symptoms in his right testicle.  He continued to have abdominal discomfort and quit coffee. When he lays  flat on his back or stands up he has almost no pain. But when he bends he has right abdominal pain. It has hurt enough that he had trouble bending over to tie his shoe.  He's had no prior abdominal surgery. He has no history of peptic ulcer disease, liver disease, pancreatic disease. He had a negative colonoscopy by Dr. Yancey Flemings in 2009.     Past Medical History  Diagnosis Date  . Dyslipidemia   . Arthritis   . Statin intolerance   . Hypertension   . Inguinal hernia 2012    right  . Fatty liver   . Hematochezia   . Heart murmur, aortic   . Heart murmur   . Abdominal pain       Past Surgical History  Procedure Date  . Shoulder surgery 2002, 2008  . Nose surgery 2005    obstruction  . Hand surgery 2004    left ganglion cyst removed       Current Outpatient Prescriptions  Medication Sig Dispense Refill  . aspirin 81 MG tablet Take 81 mg by mouth daily.        Marland Kitchen ezetimibe (ZETIA) 10 MG tablet Take 10 mg by mouth daily.        . fish oil-omega-3 fatty acids 1000 MG capsule Take 1  g by mouth daily.        . metoprolol (TOPROL-XL) 50 MG 24 hr tablet TAKE 1 TABLET EVERY DAY  30 tablet  11  . Multiple Vitamin (MULTIVITAMIN) tablet Take 1 tablet by mouth daily.        . WELCHOL 625 MG tablet TAKE 6 TABLETS BY MOUTH EVERY DAY AS DIRECTED  180 tablet  1      Allergies  Allergen Reactions  . Codeine Nausea Only and Other (See Comments)    Dizziness, sweat and will make patient pass out.    REVIEW OF SYSTEMS: Skin:  No history of rash.  No history of abnormal moles. Infection:  No history of hepatitis or HIV.  No history of MRSA. Neurologic:  No history of stroke.  No history of seizure.  No history of headaches. Cardiac: Hypertension x 5 years. Has seen Dr. Erlene Quan for heart murmur and WPW syndrome.  He has an echo scheduled. Pulmonary:  Does not smoke cigarettes.  No asthma or bronchitis.  No OSA/CPAP.  Endocrine:  No diabetes. No thyroid disease.  Hypercholesterolemia. Gastrointestinal:  See HPI.  Dr. Kinnie Scales has discussed with him about hemorrhoids. Urologic:  No history of kidney stones.  No history of bladder infections. Musculoskeletal:  Hx of bilateral rotator cuff repair.  Arthritis of both thumbs. Hematologic:  No bleeding disorder.  No history of anemia.  Not anticoagulated. Psycho-social:  The patient is oriented.   The patient has no obvious psychologic or social impairment to understanding our conversation and plan.  SOCIAL and FAMILY HISTORY: Retired from [employer], works part time in [employer] at [employer].  PHYSICAL EXAM: BP 130/80  Pulse 70  Temp(Src) 97.6 F (36.4 C) (Temporal)  Resp 16  Ht 5' 11.5" (1.816 m)  Wt 194 lb 6.4 oz (88.179 kg)  BMI 26.74 kg/m2  General: WN WM who is alert and generally healthy appearing.  HEENT: Normal. Pupils equal. Good dentition. Neck: Supple. No mass.  No thyroid mass.  Carotid pulse okay with no bruit. Lymph Nodes:  No supraclavicular or cervical nodes. Lungs: Clear to auscultation and symmetric breath sounds. Heart:  RRR. III/VI systolic murmur. Abdomen: Soft. No tenderness. No hernia. Normal bowel sounds.  No abdominal scars.  He has an assymmetry of right abdominal wall, but this appears to involve the whole rectus muscle.  He has no abdominal wall hernia.  I reviewed the CT scan from 01/2010 for correlation and this is not seen on the CT scan.  He has a high right testicle (this is seen on the CT scan), but no mass.  No scrotal abnormality.  The left testicle is in the normal location. Rectal: Not done. Extremities:  Good strength and ROM  in upper and lower extremities. Neurologic:  Grossly intact to motor and sensory function. Psychiatric: Has normal mood and affect. Behavior is normal.   DATA REVIEWED: Dr. Jennye Boroughs notes, CT scan from 01/2010.  I gave him copies of his 01/2010 CT scan.

## 2011-10-18 DIAGNOSIS — Z9289 Personal history of other medical treatment: Secondary | ICD-10-CM

## 2011-10-18 HISTORY — DX: Personal history of other medical treatment: Z92.89

## 2012-01-16 ENCOUNTER — Other Ambulatory Visit: Payer: Self-pay | Admitting: Family Medicine

## 2012-02-28 ENCOUNTER — Encounter: Payer: Self-pay | Admitting: Medical

## 2012-02-28 ENCOUNTER — Ambulatory Visit (INDEPENDENT_AMBULATORY_CARE_PROVIDER_SITE_OTHER): Payer: BLUE CROSS/BLUE SHIELD | Admitting: Medical

## 2012-02-28 VITALS — BP 120/80 | HR 72 | Temp 98.1°F | Resp 16 | Wt 201.0 lb

## 2012-02-28 DIAGNOSIS — J40 Bronchitis, not specified as acute or chronic: Secondary | ICD-10-CM

## 2012-02-28 DIAGNOSIS — R062 Wheezing: Secondary | ICD-10-CM

## 2012-02-28 MED ORDER — LEVOFLOXACIN 500 MG PO TABS: 500.0000 mg | ORAL_TABLET | Freq: Every day | ORAL | Status: AC

## 2012-02-28 MED ORDER — ALBUTEROL SULFATE HFA 108 (90 BASE) MCG/ACT IN AERS: 2.0000 | INHALATION_SPRAY | Freq: Four times a day (QID) | RESPIRATORY_TRACT | Status: DC | PRN

## 2012-02-28 NOTE — Progress Notes (Signed)
Subjective:  Daniel [name] is a 66 y.o. male who presents for 1+ wk hx/o bad cold, runny nose, raw throat, achy all over, chest congestion, bad cough, worse cough at night, worried about bronchitis.  He does note some SOB, some nausea.  No sick contacts.  He does note inhaler use in past for bronchitis.  No other aggravating or relieving factors.  No other c/o.  The following portions of the patient's history were reviewed and updated as appropriate: allergies, current medications, past family history, past medical history, past social history, past surgical history and problem list.  Past Medical History  Diagnosis Date  . Dyslipidemia   . Arthritis   . Statin intolerance   . Hypertension   . Inguinal hernia 2012    right  . Fatty liver   . Hematochezia   . Heart murmur, aortic   . Heart murmur   . Abdominal pain    ROS  Gen: no fever, chills, wt change Heart: no CP, palpitation, edema Lung: +SOB, no hemoptysis, no recent long travel, surgery, procedure, hx/o DVT/PE GI: +nausea GU: negative  Objective:   Filed Vitals:   02/28/12 1059  BP: 120/80  Pulse: 72  Temp: 98.1 F (36.7 C)  Resp: 16    General appearance: Alert, WD/WN, no distress, ill appearing                             Skin: warm, no rash, no diaphoresis                           Head: no sinus tenderness                            Eyes: conjunctiva normal, corneas clear, PERRLA                            Ears: pearly TMs, external ear canals normal                          Nose: septum midline, turbinates swollen, with erythema and clear discharge             Mouth/throat: MMM, tongue normal, mild pharyngeal erythema                           Neck: supple, no adenopathy, no thyromegaly, nontender                          Heart: RRR, normal S1, S2, no murmurs                         Lungs: +bronchial breath sounds, +scattered expiratory wheezes, no rales, no rhonchi                Extremities: no edema,  nontender    CXR today with patchiness in left lower field, but otherwise no acute changes no mass, normal cardiac silhouette.  Assessment and Plan:   Encounter Diagnoses  Name Primary?  . Bronchitis Yes  . Wheezing    Reviewed CXR, will send for overread.  Prescription given today for Levaquin and Albuterol inhaler as below.  Discussed diagnosis and treatment of bronchitis.  Suggested symptomatic  OTC remedies for cough and congestion.  Tylenol or Ibuprofen OTC for fever and malaise.  Call/return in 2-3 days if symptoms are worse or not improving.  Recheck 1wk.

## 2012-03-09 ENCOUNTER — Telehealth: Payer: Self-pay | Admitting: Family Medicine

## 2012-03-09 NOTE — Telephone Encounter (Signed)
Message copied by Janeice Robinson on Fri Mar 09, 2012  3:38 PM ------      Message from: Jac Canavan      Created: Tue Mar 06, 2012  1:45 PM       Call and see how he is doing.  We got the chest xray over read back today which suggested left lung probable infection.  He hopefully is much better now.  Radiology did recommend repeat CXR in 2-3 wk to make sure the infiltrate has cleared.

## 2012-03-09 NOTE — Telephone Encounter (Signed)
Patient states that he feels much better. He took the antibiotic and feels a lot better. He will return for the repeat CXR. CLS    Patient was notified of his CXR report. CLS

## 2012-03-14 ENCOUNTER — Ambulatory Visit
Admission: RE | Admit: 2012-03-14 | Discharge: 2012-03-14 | Disposition: A | Discharge status: Home / Self Care | Payer: BLUE CROSS/BLUE SHIELD | Source: Ambulatory Visit | Attending: Medical | Admitting: Medical

## 2012-03-14 ENCOUNTER — Ambulatory Visit (INDEPENDENT_AMBULATORY_CARE_PROVIDER_SITE_OTHER): Payer: BLUE CROSS/BLUE SHIELD | Admitting: Medical

## 2012-03-14 ENCOUNTER — Encounter: Payer: Self-pay | Admitting: Medical

## 2012-03-14 ENCOUNTER — Telehealth: Payer: Self-pay | Admitting: Family Medicine

## 2012-03-14 ENCOUNTER — Other Ambulatory Visit: Payer: Self-pay | Admitting: Dermatology

## 2012-03-14 VITALS — BP 120/80 | HR 60 | Temp 97.8°F | Resp 16 | Wt 200.0 lb

## 2012-03-14 DIAGNOSIS — I359 Nonrheumatic aortic valve disorder, unspecified: Secondary | ICD-10-CM

## 2012-03-14 DIAGNOSIS — I350 Nonrheumatic aortic (valve) stenosis: Secondary | ICD-10-CM

## 2012-03-14 DIAGNOSIS — R062 Wheezing: Secondary | ICD-10-CM

## 2012-03-14 DIAGNOSIS — R05 Cough: Secondary | ICD-10-CM

## 2012-03-14 DIAGNOSIS — Z23 Encounter for immunization: Secondary | ICD-10-CM

## 2012-03-14 MED ORDER — CETIRIZINE HCL 10 MG PO TABS: 10.0000 mg | ORAL_TABLET | Freq: Every day | ORAL | Status: DC

## 2012-03-14 NOTE — Progress Notes (Signed)
Subjective: Here for f/u. I saw him recently for bronchitis 02/28/12. He got better from the bronchitis.  He notes that he thinks he is getting similar symptoms again.  Having cough, some phlegm.  Still using inhaler at bedtime as it seems to help.  At night has drainage, cough, and hoarseness in the morning.    He has f/u planned in May for physical for work.    He has question about pneumococcal vaccine. Had one prior in 24s, but would like another given the recent bronchitis.   Past Medical History  Diagnosis Date  . Dyslipidemia   . Arthritis   . Statin intolerance   . Hypertension   . Inguinal hernia 2012    right  . Fatty liver   . Hematochezia   . Heart murmur, aortic   . Heart murmur   . Abdominal pain    ROS Gen: no fever, chills, wt changes, fatigue Skin: no changes HEENT: negative Heart: no CP, palpitations, DOE, PND, orthopnea Lungs: cough, some wheezing GI: no pain, NVD    Objective:   Physical Exam  Filed Vitals:   03/14/12 0900  BP: 120/80  Pulse: 60  Temp: 97.8 F (36.6 C)  Resp: 16    General appearance: alert, no distress, WD/WN HEENT: normocephalic, sclerae anicteric, TMs pearly, nares patent, no discharge or erythema, post nasal drainage+ Oral cavity: MMM, no lesions Neck: supple, no lymphadenopathy, no thyromegaly, no masses Heart: RRR, normal S1, S2, no murmurs Lungs: few expiratory wheezes, but no rhonchi, or rales Pulses: 2+ symmetric Ext: no calve tenderness   Assessment and Plan :    Encounter Diagnoses  Name Primary?  . Cough Yes  . Wheezing   . Need for pneumococcal vaccination   . 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  Will send for repeat CXR for f/u on CXR 2 wk ago, will set him up for PFTs as well. Begin Zyrtec 10mg  QHS OTC.   Wheezing - c/t albuterol prn  Pneumococcal vaccine today, VIS and counseling given.  Aortic stenosis - will try and get copies of cardiology notes and echo from  2012.  Follow-up pending studies

## 2012-03-14 NOTE — Patient Instructions (Signed)
We updated your pneumococcal vaccine today.    We will have you go for chest xray repeat today and we will schedule you for a lung function test.   For now, begin Zyrtec 10mg  OTC at bedtime daily.    Use the inhaler as needed, and we will follow up after tests.

## 2012-03-14 NOTE — Telephone Encounter (Signed)
[facility] CALLED AND SAID THE CXR REPORT - 10 X 8 MM TRIANGLER NODULE DENSITY IN LEFT UPPER LOBE. CHEST CT RECOMMEND TO EXCLUDE PULMONARY NODULE. CLS

## 2012-03-15 ENCOUNTER — Other Ambulatory Visit: Payer: Self-pay | Admitting: Family Medicine

## 2012-03-15 ENCOUNTER — Other Ambulatory Visit: Payer: Self-pay | Admitting: Medical

## 2012-03-15 ENCOUNTER — Telehealth: Payer: Self-pay | Admitting: Family Medicine

## 2012-03-15 DIAGNOSIS — R911 Solitary pulmonary nodule: Secondary | ICD-10-CM

## 2012-03-15 NOTE — Telephone Encounter (Signed)
PATIENT WAS NOTIFIED OF HIS CXR REPORT AND HIS APPOINTMENT FOR HIS CHEST CT. CLS

## 2012-03-15 NOTE — Telephone Encounter (Signed)
Chest xray shows that pneumonia cleared, but they noticed an abnormality.  The radiologist recommends CT of chest to further evaluate to help exclude anything worrisome.   Pls set up CT with and without CM.  He will need to come by for BMET lab prior to check kidney function.

## 2012-03-15 NOTE — Telephone Encounter (Signed)
Message copied by Janeice Robinson on Thu Mar 15, 2012 12:32 PM ------      Message from: Aleen Campi, DAVID S      Created: Thu Mar 15, 2012  7:26 AM       Correction: CT chest with contrast (not with and without).

## 2012-03-15 NOTE — Telephone Encounter (Signed)
DONE....[facility] 03/20/12 AT 1215 PM

## 2012-03-16 ENCOUNTER — Telehealth: Payer: Self-pay | Admitting: Internal Medicine

## 2012-03-16 ENCOUNTER — Other Ambulatory Visit: Payer: BLUE CROSS/BLUE SHIELD

## 2012-03-16 ENCOUNTER — Telehealth (INDEPENDENT_AMBULATORY_CARE_PROVIDER_SITE_OTHER): Payer: Self-pay

## 2012-03-16 DIAGNOSIS — R911 Solitary pulmonary nodule: Secondary | ICD-10-CM

## 2012-03-16 LAB — BASIC METABOLIC PANEL
Calcium: 9.3 mg/dL (ref 8.4–10.5)
Glucose, Bld: 91 mg/dL (ref 70–99)
Sodium: 143 mEq/L (ref 135–145)

## 2012-03-16 NOTE — Telephone Encounter (Signed)
I read Dr Allene Pyo last office note.  He had planned on seeing the patient 1st but he did mention the ct.  Since the pt is scheduled for a chest ct already,  I scheduled the abdomen and pelvis ct along with it.  We will plan to follow up with the patient after the ct.  I left a message for the pt to call me.

## 2012-03-16 NOTE — Telephone Encounter (Signed)
I spoke to the patient and informed him that I added on the abdomen ct.  I asked him to call [facility] to verify if there is an further prep.

## 2012-03-16 NOTE — Telephone Encounter (Signed)
Prior Berkley Harvey was done for the CT of the chest for pt for his appt on 03/20/12 @12 :15pm and it was approved. Confirmation # [id number]

## 2012-03-16 NOTE — Telephone Encounter (Signed)
Message copied by Ivory Broad on Fri Mar 16, 2012 10:44 AM ------      Message from: Anette Riedel      Created: Fri Mar 16, 2012  9:54 AM      Regarding: Abdominal CT scan order      Contact: [phone]       Pt saw Dr. Ezzard Standing last Nov regarding possible hernia.  Dr advised will order CT scan if pain persists.  Pt was feeling better but pain is now back.  He is having a CT scan for his lungs on 4/23 at [facility]) and would like for Dr Ezzard Standing to put in CT scan order for abdomen on the same day.  Wants to have lung and abdomen CT scan done at the same time.

## 2012-03-19 ENCOUNTER — Encounter: Payer: Self-pay | Admitting: Medical

## 2012-03-20 ENCOUNTER — Ambulatory Visit
Admission: RE | Admit: 2012-03-20 | Discharge: 2012-03-20 | Disposition: A | Discharge status: Home / Self Care | Payer: BC Managed Care – PPO | Source: Ambulatory Visit | Attending: Surgery | Admitting: Surgery

## 2012-03-20 ENCOUNTER — Telehealth: Payer: Self-pay | Admitting: Family Medicine

## 2012-03-20 ENCOUNTER — Ambulatory Visit
Admission: RE | Admit: 2012-03-20 | Discharge: 2012-03-20 | Disposition: A | Discharge status: Home / Self Care | Payer: BC Managed Care – PPO | Source: Ambulatory Visit | Attending: Medical | Admitting: Medical

## 2012-03-20 DIAGNOSIS — Z9289 Personal history of other medical treatment: Secondary | ICD-10-CM

## 2012-03-20 DIAGNOSIS — R911 Solitary pulmonary nodule: Secondary | ICD-10-CM

## 2012-03-20 HISTORY — DX: Personal history of other medical treatment: Z92.89

## 2012-03-20 MED ORDER — IOHEXOL 300 MG/ML  SOLN
100.0000 mL | Freq: Once | INTRAMUSCULAR | Status: AC | PRN
  Administered 2012-03-20: 100 mL via INTRAVENOUS

## 2012-03-21 ENCOUNTER — Telehealth (INDEPENDENT_AMBULATORY_CARE_PROVIDER_SITE_OTHER): Payer: Self-pay

## 2012-03-21 NOTE — Telephone Encounter (Signed)
DONE

## 2012-03-21 NOTE — Telephone Encounter (Signed)
I notified the patient that Dr Ezzard Standing reviewed the ct and agreed that it showed no hernia and his exam showed no hernia.  There is no need to come in the office because he can't offer anything further.  The patient will call if any further concerns.

## 2012-03-23 ENCOUNTER — Encounter: Payer: Self-pay | Admitting: Medical

## 2012-04-02 ENCOUNTER — Ambulatory Visit (INDEPENDENT_AMBULATORY_CARE_PROVIDER_SITE_OTHER): Payer: BC Managed Care – PPO | Admitting: Medical

## 2012-04-02 ENCOUNTER — Encounter: Payer: Self-pay | Admitting: Medical

## 2012-04-02 VITALS — BP 120/80 | HR 60 | Temp 97.8°F | Resp 16 | Ht 72.0 in | Wt 202.0 lb

## 2012-04-02 DIAGNOSIS — Z23 Encounter for immunization: Secondary | ICD-10-CM

## 2012-04-02 DIAGNOSIS — I10 Essential (primary) hypertension: Secondary | ICD-10-CM

## 2012-04-02 DIAGNOSIS — E785 Hyperlipidemia, unspecified: Secondary | ICD-10-CM

## 2012-04-02 DIAGNOSIS — Z139 Encounter for screening, unspecified: Secondary | ICD-10-CM

## 2012-04-02 DIAGNOSIS — R011 Cardiac murmur, unspecified: Secondary | ICD-10-CM

## 2012-04-02 DIAGNOSIS — Z111 Encounter for screening for respiratory tuberculosis: Secondary | ICD-10-CM

## 2012-04-02 DIAGNOSIS — Z0000 Encounter for general adult medical examination without abnormal findings: Secondary | ICD-10-CM

## 2012-04-02 LAB — LIPID PANEL
Cholesterol: 232 mg/dL — ABNORMAL HIGH (ref 0–200)
HDL: 44 mg/dL (ref >39)
Total CHOL/HDL Ratio: 5.3 Ratio
VLDL: 49 mg/dL — ABNORMAL HIGH (ref 0–40)

## 2012-04-02 LAB — POCT URINALYSIS DIPSTICK
Bilirubin, UA: NEGATIVE
Glucose, UA: NEGATIVE
Leukocytes, UA: NEGATIVE
Nitrite, UA: NEGATIVE
pH, UA: 7

## 2012-04-02 LAB — CBC WITH DIFFERENTIAL/PLATELET
Basophils Absolute: 0 10*3/uL (ref 0.0–0.1)
Basophils Relative: 1 % (ref 0–1)
Hemoglobin: 15.9 g/dL (ref 13.0–17.0)
MCHC: 33 g/dL (ref 30.0–36.0)
Monocytes Relative: 7 % (ref 3–12)
Neutro Abs: 3.3 10*3/uL (ref 1.7–7.7)
Neutrophils Relative %: 54 % (ref 43–77)

## 2012-04-02 LAB — HEPATIC FUNCTION PANEL
AST: 31 U/L (ref 0–37)
Bilirubin, Direct: 0.1 mg/dL (ref 0.0–0.3)
Indirect Bilirubin: 0.4 mg/dL (ref 0.0–0.9)
Total Bilirubin: 0.5 mg/dL (ref 0.3–1.2)

## 2012-04-02 LAB — TSH: TSH: 2.041 u[IU]/mL (ref 0.350–4.500)

## 2012-04-02 NOTE — Patient Instructions (Signed)
Preventative Care for Adults, Male       REGULAR HEALTH EXAMS:  A routine yearly physical is a good way to check in with your primary care provider about your health and preventive screening. It is also an opportunity to share updates about your health and any concerns you have, and receive a thorough all-over exam.   Most health insurance companies pay for at least some preventative services.  Check with your health plan for specific coverages.  WHAT PREVENTATIVE SERVICES DO MEN NEED?  Adult men should have their weight and blood pressure checked regularly.   Men age 35 and older should have their cholesterol levels checked regularly.  Beginning at age 50 and continuing to age 75, men should be screened for colorectal cancer.  Certain people should may need continued testing until age 85.  Other cancer screening may include exams for testicular and prostate cancer.  Updating vaccinations is part of preventative care.  Vaccinations help protect against diseases such as the flu.  Lab tests are generally done as part of preventative care to screen for anemia and blood disorders, to screen for problems with the kidneys and liver, to screen for bladder problems, to check blood sugar, and to check your cholesterol level.  Preventative services generally include counseling about diet, exercise, avoiding tobacco, drugs, excessive alcohol consumption, and sexually transmitted infections.    GENERAL RECOMMENDATIONS FOR GOOD HEALTH:  Healthy diet:  Eat a variety of foods, including fruit, vegetables, animal or vegetable protein, such as meat, fish, chicken, and eggs, or beans, lentils, tofu, and grains, such as rice.  Drink plenty of water daily.  Decrease saturated fat in the diet, avoid lots of red meat, processed foods, sweets, fast foods, and fried foods.  Exercise:  Aerobic exercise helps maintain good heart health. At least 30-40 minutes of moderate-intensity exercise is recommended.  For example, a brisk walk that increases your heart rate and breathing. This should be done on most days of the week.   Find a type of exercise or a variety of exercises that you enjoy so that it becomes a part of your daily life.  Examples are running, walking, swimming, water aerobics, and biking.  For motivation and support, explore group exercise such as aerobic class, spin class, Zumba, Yoga,or  martial arts, etc.    Set exercise goals for yourself, such as a certain weight goal, walk or run in a race such as a 5k walk/run.  Speak to your primary care provider about exercise goals.  Disease prevention:  If you smoke or chew tobacco, find out from your caregiver how to quit. It can literally save your life, no matter how long you have been a tobacco user. If you do not use tobacco, never begin.   Maintain a healthy diet and normal weight. Increased weight leads to problems with blood pressure and diabetes.   The Body Mass Index or BMI is a way of measuring how much of your body is fat. Having a BMI above 27 increases the risk of heart disease, diabetes, hypertension, stroke and other problems related to obesity. Your caregiver can help determine your BMI and based on it develop an exercise and dietary program to help you achieve or maintain this important measurement at a healthful level.  High blood pressure causes heart and blood vessel problems.  Persistent high blood pressure should be treated with medicine if weight loss and exercise do not work.   Fat and cholesterol leaves deposits in your arteries   that can block them. This causes heart disease and vessel disease elsewhere in your body.  If your cholesterol is found to be high, or if you have heart disease or certain other medical conditions, then you may need to have your cholesterol monitored frequently and be treated with medication.   Ask if you should have a stress test if your history suggests this. A stress test is a test done on  a treadmill that looks for heart disease. This test can find disease prior to there being a problem.  Avoid drinking alcohol in excess (more than two drinks per day).  Avoid use of street drugs. Do not share needles with anyone. Ask for professional help if you need assistance or instructions on stopping the use of alcohol, cigarettes, and/or drugs.  Brush your teeth twice a day with fluoride toothpaste, and floss once a day. Good oral hygiene prevents tooth decay and gum disease. The problems can be painful, unattractive, and can cause other health problems. Visit your dentist for a routine oral and dental check up and preventive care every 6-12 months.   Look at your skin regularly.  Use a mirror to look at your back. Notify your caregivers of changes in moles, especially if there are changes in shapes, colors, a size larger than a pencil eraser, an irregular border, or development of new moles.  Safety:  Use seatbelts 100% of the time, whether driving or as a passenger.  Use safety devices such as hearing protection if you work in environments with loud noise or significant background noise.  Use safety glasses when doing any work that could send debris in to the eyes.  Use a helmet if you ride a bike or motorcycle.  Use appropriate safety gear for contact sports.  Talk to your caregiver about gun safety.  Use sunscreen with a SPF (or skin protection factor) of 15 or greater.  Lighter skinned people are at a greater risk of skin cancer. Don't forget to also wear sunglasses in order to protect your eyes from too much damaging sunlight. Damaging sunlight can accelerate cataract formation.   Practice safe sex. Use condoms. Condoms are used for birth control and to help reduce the spread of sexually transmitted infections (or STIs).  Some of the STIs are gonorrhea (the clap), chlamydia, syphilis, trichomonas, herpes, HPV (human papilloma virus) and HIV (human immunodeficiency virus) which causes AIDS.  The herpes, HIV and HPV are viral illnesses that have no cure. These can result in disability, cancer and death.   Keep carbon monoxide and smoke detectors in your home functioning at all times. Change the batteries every 6 months or use a model that plugs into the wall.   Vaccinations:  Stay up to date with your tetanus shots and other required immunizations. You should have a booster for tetanus every 10 years. Be sure to get your flu shot every year, since 5%-20% of the [place] population comes down with the flu. The flu vaccine changes each year, so being vaccinated once is not enough. Get your shot in the fall, before the flu season peaks.   Other vaccines to consider:  Pneumococcal vaccine to protect against certain types of pneumonia.  This is normally recommended for adults age 65 or older.  However, adults younger than 65 years old with certain underlying conditions such as diabetes, heart or lung disease should also receive the vaccine.  Shingles vaccine to protect against Varicella Zoster if you are older than age 60, or younger   than 64 years old with certain underlying illness.  Hepatitis A vaccine to protect against a form of infection of the liver by a virus acquired from food.  Hepatitis B vaccine to protect against a form of infection of the liver by a virus acquired from blood or body fluids, particularly if you work in health care.  If you plan to travel internationally, check with your local health department for specific vaccination recommendations.  Cancer Screening:  Most routine colon cancer screening begins at the age of 50. On a yearly basis, doctors may provide special easy to use take-home tests to check for hidden blood in the stool. Sigmoidoscopy or colonoscopy can detect the earliest forms of colon cancer and is life saving. These tests use a small camera at the end of a tube to directly examine the colon. Speak to your caregiver about this at age 50, when routine  screening begins (and is repeated every 5 years unless early forms of pre-cancerous polyps or small growths are found).   At the age of 50 men usually start screening for prostate cancer every year. Screening may begin at a younger age for those with higher risk. Those at higher risk include African-Americans or having a family history of prostate cancer. There are two types of tests for prostate cancer:   Prostate-specific antigen (PSA) testing. Recent studies raise questions about prostate cancer using PSA and you should discuss this with your caregiver.   Digital rectal exam (in which your doctor's lubricated and gloved finger feels for enlargement of the prostate through the anus).   Screening for testicular cancer.  Do a monthly exam of your testicles. Gently roll each testicle between your thumb and fingers, feeling for any abnormal lumps. The best time to do this is after a hot shower or bath when the tissues are looser. Notify your caregivers of any lumps, tenderness or changes in size or shape immediately.     

## 2012-04-02 NOTE — Progress Notes (Signed)
Subjective:   HPI  Daniel [name] is a 66 y.o. male who presents for a complete physical.   Needs forms completed and vaccines reviewed for college physical.  Going back to college [employer]. [employer] for LPN program.   Still works part time as a Emergency planning/management officer.    Preventative care: Last ophthalmology few years ago, last dental visit last month.  Saw cardiology 2012, Dr. Allyson Sabal for routine f/u on murmur.   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  No other new c/o.   Thinks he may have allergy to toothpaste given some irritation and rash of lips, sensation of chapped lips.  Worse after using toothpaste.   Reviewed their medical, surgical, family, social, medication, and allergy history and updated chart as appropriate.  Past Medical History  Diagnosis Date  . Dyslipidemia   . Arthritis   . Statin intolerance   . Hypertension   . Inguinal hernia 2012    right  . Fatty liver   . Heart murmur, aortic   . Abdominal pain     with coffee  . Diverticulosis   . Allergy     Past Surgical History  Procedure Date  . Shoulder surgery 2002, 2008    bilat, RTC  . Nose surgery 2005    obstruction, polyp and deviated septum; Dr. Jenne Pane  . Hand surgery 2004    left ganglion cyst removed   . Colonoscopy 2010    repeat 2014    Family History  Problem Relation Age of Onset  . Cancer Mother     breast  . Dementia Mother 71  . Kidney disease Father 54    kidney failure  . Rheum arthritis Father   . Diabetes Neg Hx   . Heart disease Neg Hx   . Stroke Neg Hx   . Hypertension Neg Hx   . Hyperlipidemia Neg Hx     History   Social History  . Marital Status: Married    Spouse Name: N/A    Number of Children: N/A  . Years of Education: N/A   Occupational History  . nursing student; part time university [employer]   Social History Main Topics  . Smoking status: Never Smoker   . Smokeless tobacco: Never Used  . Alcohol Use:  1.2 oz/week    2 Cans of beer per week     beer once or twice per week  . Drug Use: No  . Sexually Active: Not on file   Other Topics Concern  . Not on file   Social History Narrative   Married, no children, 2 dogs and a cat, exercise - aerobics, some weight bearing    Current Outpatient Prescriptions on File Prior to Visit  Medication Sig Dispense Refill  . aspirin 81 MG tablet Take 81 mg by mouth daily.        . cetirizine (ZYRTEC) 10 MG tablet Take 1 tablet (10 mg total) by mouth daily.  30 tablet  4  . ezetimibe (ZETIA) 10 MG tablet Take 10 mg by mouth daily.        . fish oil-omega-3 fatty acids 1000 MG capsule Take 1 g by mouth daily.        . metoprolol (TOPROL-XL) 50 MG 24 hr tablet TAKE 1 TABLET EVERY DAY  30 tablet  11  . Multiple Vitamin (MULTIVITAMIN) tablet Take 1 tablet by mouth daily.        Marland Kitchen  WELCHOL 625 MG tablet TAKE 6 TABLETS BY MOUTH EVERY DAY AS DIRECTED  180 tablet  1    Allergies  Allergen Reactions  . Codeine Nausea Only and Other (See Comments)    Dizziness, sweat and will make patient pass out.  . Lipitor (Atorvastatin)     Elevated LFTs, no symptoms   Review of Systems Constitutional: -fever, -chills, -sweats, -unexpected weight change, -anorexia, -fatigue Allergy: -sneezing, -itching, -congestion Dermatology: denies changing moles, rash, lumps, new worrisome lesions ENT: -runny nose, -ear pain, -sore throat, -hoarseness, -sinus pain, -teeth pain, -tinnitus, -hearing loss, -epistaxis Cardiology:  -chest pain, -palpitations, -edema, -orthopnea, -paroxysmal nocturnal dyspnea Respiratory: -cough, -shortness of breath, -dyspnea on exertion, -wheezing, -hemoptysis Gastroenterology: -abdominal pain, -nausea, -vomiting, -diarrhea, -constipation, -blood in stool, -changes in bowel movement, -dysphagia Hematology: -bleeding or bruising problems Musculoskeletal: +arthralgias, -myalgias, -joint swelling, -back pain, -neck pain, -cramping, -gait  changes Ophthalmology: -vision changes, -eye redness, -itching, -discharge Urology: -dysuria, -difficulty urinating, -hematuria, -urinary frequency, -urgency, incontinence Neurology: -headache, -weakness, -tingling, -numbness, -speech abnormality, -memory loss, -falls, -dizziness Psychology:  -depressed mood, -agitation, -sleep problems       Objective:   Physical Exam  Filed Vitals:   04/02/12 0830  BP: 120/80  Pulse: 60  Temp: 97.8 F (36.6 C)  Resp: 16    General appearance: alert, no distress, WD/WN, white male, slightly overweight Skin: scattered benign appearing macules including seborrheic keratosis on low back, mid lumbar region with scar from prior biopsy midline, several other benign appearing macules and papule on torso, none larger than 4mm HEENT: normocephalic, conjunctiva/corneas normal, sclerae anicteric, PERRLA, EOMi, nares patent, no discharge or erythema, pharynx normal Oral cavity: MMM, tongue normal, teeth in good repair Neck: supple, no lymphadenopathy, no thyromegaly, no masses, normal ROM, no bruits Chest: non tender, normal shape and expansion, Heart: II-III/VI S1 ejection murmur heard best in left upper sternal border, otherwise RRR, normal S2 Lungs: CTA bilaterally, no wheezes, rhonchi, or rales Abdomen: +bs, soft, non tender, non distended, no masses, no hepatomegaly, no splenomegaly, no bruits Back: non tender, normal ROM, no scoliosis Musculoskeletal: upper extremities non tender, no obvious deformity, normal ROM throughout, lower extremities non tender, no obvious deformity, normal ROM throughout Extremities: no edema, no cyanosis, no clubbing Pulses: 2+ symmetric, upper and lower extremities, normal cap refill Neurological: alert, oriented x 3, CN2-12 intact, strength normal upper extremities and lower extremities, sensation normal throughout, DTRs 2+ throughout, no cerebellar signs, gait normal Psychiatric: normal affect, behavior normal, pleasant   GU: normal male external genitalia, nontender, no masses, no hernia, no lymphadenopathy Rectal: anus normal, prostate WNL, occult negative stool   Assessment and Plan :    Encounter Diagnoses  Name Primary?  . Routine general medical examination at a health care facility Yes  . Screening for condition   . Essential hypertension, benign   . Dyslipidemia   . PPD screening test   . Heart murmur      Physical exam - discussed healthy lifestyle, diet, exercise, preventative care, vaccinations, and addressed their concerns.    Varicella and Rubella titers for college applications.  HTN - controlled on current medication.  Dyslipidemia - hx/o elevated LFTs on Lipitor. Currently on Welchol.  Labs today.  PPD screen test today.   Will need 2 step.   Heart murmur - likely aortic stenosis, will get cardiology records from 2012.   Possible allergen to toothpaste or food given lip c/o - he will ask pharmacist about hypoallergenic toothpaste and will use process of elimination  to ascertain the trigger.   Discussed abnormal hearing exam today.  He declines ENT or audiology referral.   Follow-up pending labs.

## 2012-04-03 ENCOUNTER — Other Ambulatory Visit: Payer: Self-pay | Admitting: Family Medicine

## 2012-04-03 NOTE — Telephone Encounter (Signed)
I believe this is your pt.

## 2012-04-03 NOTE — Telephone Encounter (Signed)
PT IS ALSO CONCERNED @ [place] NOT HAVING HIS CARDIOLOGIST RECORDS & STATES WE SHOULD ALREADY HAVE THEM.  HE SIGNED RELEASE AGAIN YESTERDAY-LM

## 2012-04-04 ENCOUNTER — Other Ambulatory Visit: Payer: BC Managed Care – PPO

## 2012-04-04 LAB — TB SKIN TEST

## 2012-04-06 ENCOUNTER — Other Ambulatory Visit: Payer: Self-pay | Admitting: Family Medicine

## 2012-04-09 ENCOUNTER — Other Ambulatory Visit: Payer: Self-pay | Admitting: Medical

## 2012-04-09 ENCOUNTER — Other Ambulatory Visit: Payer: BC Managed Care – PPO

## 2012-04-09 MED ORDER — SIMVASTATIN 10 MG PO TABS: ORAL_TABLET | ORAL | Status: DC

## 2012-04-16 ENCOUNTER — Other Ambulatory Visit (INDEPENDENT_AMBULATORY_CARE_PROVIDER_SITE_OTHER): Payer: BC Managed Care – PPO

## 2012-04-16 DIAGNOSIS — Z111 Encounter for screening for respiratory tuberculosis: Secondary | ICD-10-CM

## 2012-04-16 DIAGNOSIS — Z23 Encounter for immunization: Secondary | ICD-10-CM

## 2012-04-16 NOTE — Progress Notes (Unsigned)
THIS IS STEP 2 OF HIS PPD

## 2012-04-18 LAB — TB SKIN TEST: TB Skin Test: NEGATIVE

## 2012-04-28 ENCOUNTER — Other Ambulatory Visit: Payer: Self-pay | Admitting: Family Medicine

## 2012-04-30 NOTE — Telephone Encounter (Signed)
i think this is your pt

## 2012-05-05 ENCOUNTER — Other Ambulatory Visit: Payer: Self-pay | Admitting: Family Medicine

## 2012-05-07 NOTE — Telephone Encounter (Signed)
I beleive this is one of your pt

## 2012-07-16 ENCOUNTER — Encounter: Payer: Self-pay | Admitting: Medical

## 2012-07-16 ENCOUNTER — Ambulatory Visit (INDEPENDENT_AMBULATORY_CARE_PROVIDER_SITE_OTHER): Payer: BC Managed Care – PPO | Admitting: Medical

## 2012-07-16 VITALS — BP 140/90 | HR 80 | Temp 97.8°F | Resp 16 | Wt 204.0 lb

## 2012-07-16 DIAGNOSIS — H6590 Unspecified nonsuppurative otitis media, unspecified ear: Secondary | ICD-10-CM

## 2012-07-16 MED ORDER — MOMETASONE FUROATE 50 MCG/ACT NA SUSP: 2.0000 | Freq: Every day | NASAL | Status: DC

## 2012-07-16 NOTE — Patient Instructions (Signed)
Drink plenty of water often this next week.  Begin Nasonex nasal spray, 2 sprays twice daily for a week.  Continue Mucinex D, 1 tablet twice daily.  Call Friday if not improving.  Serous Otitis Media  Serous otitis media is also known as otitis media with effusion (OME). It means there is fluid in the middle ear space. This space contains the bones for hearing and air. Air in the middle ear space helps to transmit sound.  The air gets there through the eustachian tube. This tube goes from the back of the throat to the middle ear space. It keeps the pressure in the middle ear the same as the outside world. It also helps to drain fluid from the middle ear space. CAUSES  OME occurs when the eustachian tube gets blocked. Blockage can come from:  Ear infections.   Colds and other upper respiratory infections.   Allergies.   Irritants such as cigarette smoke.   Sudden changes in air pressure (such as descending in an airplane).   Enlarged adenoids.  During colds and upper respiratory infections, the middle ear space can become temporarily filled with fluid. This can happen after an ear infection also. Once the infection clears, the fluid will generally drain out of the ear through the eustachian tube. If it does not, then OME occurs. SYMPTOMS   Hearing loss.   A feeling of fullness in the ear - but no pain.   Young children may not show any symptoms.  DIAGNOSIS   Diagnosis of OME is made by an ear exam.   Tests may be done to check on the movement of the eardrum.   Hearing exams may be done.  TREATMENT   The fluid most often goes away without treatment.   If allergy is the cause, allergy treatment may be helpful.   Fluid that persists for several months may require minor surgery. A small tube is placed in the ear drum to:   Drain the fluid.   Restore the air in the middle ear space.   In certain situations, antibiotics are used to avoid surgery.   Surgery may be done to  remove enlarged adenoids (if this is the cause).  HOME CARE INSTRUCTIONS   Keep children away from tobacco smoke.   Be sure to keep follow up appointments, if any.  SEEK MEDICAL CARE IF:   Hearing is not better in 3 months.   Hearing is worse.   Ear pain.   Drainage from the ear.   Dizziness.  Document Released: 02/04/2004 Document Revised: 11/03/2011 Document Reviewed: 12/04/2008 [facility] Patient Information 2012 [place], [place].

## 2012-07-16 NOTE — Progress Notes (Signed)
Subjective: Over the weekend on Saturday had decreased hearing on the left, pressure sensation like fluid behind ear drum.  Talking sounds like he is in a barrel.  Used some Mucinex D.  Has some improvement today after 2nd day on Mucinex D.  Currently has ringing in the left ear, feel like in a barrel.  He tried ear wax removal kit and qtip of left ear but no wax.  Denies fever, sinus pressure, sore throat, post nasal drip, cough.   No NVD.  Denies sick contacts.  No other aggravating or relieving factors.    No other c/o.   Objective:   Filed Vitals:   07/16/12 0843  BP: 140/90  Pulse: 80  Temp: 97.8 F (36.6 C)  Resp: 16    General appearance: Alert, WD/WN, no distress, mildly ill appearing                             Skin: warm, no rash                           Head: no sinus tenderness                            Eyes: conjunctiva normal, corneas clear, PERRLA                            Ears: left TM flat, but no erythema, right TM pearly, external ear canals normal                          Nose: septum midline, turbinates swollen, with erythema and clear discharge             Mouth/throat: MMM, tongue normal, mild pharyngeal erythema                           Neck: supple, no adenopathy, no thyromegaly, nontender                          Heart: RRR, normal S1, S2, no murmurs                         Lungs: CTA bilaterally, no wheezes, rales, or rhonchi     Assessment and Plan:   Encounter Diagnosis  Name Primary?  . Serous otitis media Yes   Prescription for Nasonex given as below.  Ct/ Mucinex D 1 tablet BID for this week. Discussed diagnosis and treatment of otitis media.  Suggested symptomatic OTC remedies.  Tylenol or Ibuprofen OTC for fever and malaise.  Call/return in 2-3 days if symptoms aren't resolving.

## 2012-07-24 ENCOUNTER — Other Ambulatory Visit: Payer: Self-pay | Admitting: Medical

## 2012-08-02 ENCOUNTER — Telehealth: Payer: Self-pay | Admitting: Internal Medicine

## 2012-08-02 MED ORDER — CARISOPRODOL 350 MG PO TABS: 350.0000 mg | ORAL_TABLET | Freq: Four times a day (QID) | ORAL | Status: AC | PRN

## 2012-08-02 NOTE — Telephone Encounter (Signed)
CALLED SOMA IN PER JCL

## 2012-08-02 NOTE — Telephone Encounter (Signed)
Call and soma 350 mg #20. One when necessary muscle spasm.

## 2012-08-02 NOTE — Telephone Encounter (Signed)
Pt needs a muscle relaxer for his back spasms and he can not remember what he was put on and doesn't use it very often but has had a flare up send to [facility] [address]. Call pt when ready [phone]

## 2012-08-06 ENCOUNTER — Other Ambulatory Visit: Payer: BC Managed Care – PPO

## 2012-08-06 DIAGNOSIS — Z139 Encounter for screening, unspecified: Secondary | ICD-10-CM

## 2012-08-07 LAB — HEPATIC FUNCTION PANEL
AST: 28 U/L (ref 0–37)
Albumin: 4 g/dL (ref 3.5–5.2)
Alkaline Phosphatase: 41 U/L (ref 39–117)
Total Protein: 7 g/dL (ref 6.0–8.3)

## 2012-08-08 ENCOUNTER — Encounter: Payer: Self-pay | Admitting: Family Medicine

## 2012-10-24 ENCOUNTER — Other Ambulatory Visit: Payer: Self-pay | Admitting: Medical

## 2012-11-13 ENCOUNTER — Other Ambulatory Visit: Payer: Self-pay | Admitting: Medical

## 2012-11-22 ENCOUNTER — Other Ambulatory Visit: Payer: Self-pay | Admitting: Medical

## 2012-12-12 ENCOUNTER — Other Ambulatory Visit: Payer: Self-pay | Admitting: Medical

## 2013-01-07 ENCOUNTER — Other Ambulatory Visit: Payer: Self-pay | Admitting: Dermatology

## 2013-01-12 ENCOUNTER — Other Ambulatory Visit: Payer: Self-pay | Admitting: Medical

## 2013-01-14 NOTE — Telephone Encounter (Signed)
Patient needs a office visit to follow up on his cholesterol.

## 2013-03-19 ENCOUNTER — Ambulatory Visit (INDEPENDENT_AMBULATORY_CARE_PROVIDER_SITE_OTHER): Payer: Medicare Other | Admitting: Medical

## 2013-03-19 ENCOUNTER — Encounter: Payer: Self-pay | Admitting: Medical

## 2013-03-19 VITALS — BP 130/88 | HR 62 | Temp 97.6°F | Resp 16 | Wt 204.0 lb

## 2013-03-19 DIAGNOSIS — I359 Nonrheumatic aortic valve disorder, unspecified: Secondary | ICD-10-CM

## 2013-03-19 DIAGNOSIS — I10 Essential (primary) hypertension: Secondary | ICD-10-CM

## 2013-03-19 DIAGNOSIS — M722 Plantar fascial fibromatosis: Secondary | ICD-10-CM

## 2013-03-19 DIAGNOSIS — I350 Nonrheumatic aortic (valve) stenosis: Secondary | ICD-10-CM

## 2013-03-19 DIAGNOSIS — E785 Hyperlipidemia, unspecified: Secondary | ICD-10-CM

## 2013-03-19 DIAGNOSIS — J309 Allergic rhinitis, unspecified: Secondary | ICD-10-CM

## 2013-03-19 LAB — CBC WITH DIFFERENTIAL/PLATELET
Basophils Relative: 1 % (ref 0–1)
Hemoglobin: 15.5 g/dL (ref 13.0–17.0)
Lymphs Abs: 1.5 10*3/uL (ref 0.7–4.0)
MCHC: 34.6 g/dL (ref 30.0–36.0)
Monocytes Relative: 10 % (ref 3–12)
Neutro Abs: 3.2 10*3/uL (ref 1.7–7.7)
Neutrophils Relative %: 59 % (ref 43–77)
Platelets: 143 10*3/uL — ABNORMAL LOW (ref 150–400)
RBC: 4.9 MIL/uL (ref 4.22–5.81)

## 2013-03-19 LAB — COMPREHENSIVE METABOLIC PANEL
Albumin: 4.5 g/dL (ref 3.5–5.2)
BUN: 24 mg/dL — ABNORMAL HIGH (ref 6–23)
Calcium: 9.4 mg/dL (ref 8.4–10.5)
Chloride: 105 mEq/L (ref 96–112)
Glucose, Bld: 89 mg/dL (ref 70–99)
Potassium: 3.9 mEq/L (ref 3.5–5.3)
Total Protein: 7.2 g/dL (ref 6.0–8.3)

## 2013-03-19 LAB — LIPID PANEL: Cholesterol: 178 mg/dL (ref 0–200)

## 2013-03-19 NOTE — Progress Notes (Signed)
Subjective:  Daniel [name] is a 67 y.o. male who presents for routine f/u, fasting labs.  Followed by Dr. Celene Skeen, podiatrist. Hasn't seen cardiology in a few years. Allergist - Dr. Powell Callas, allergy shots once week.  Allergic to cats, but they have a cat.    Here for f/u on dyslipidemia - taking Simvastatin QOD, and Zetia daily, compliant.  Not taking fish oil.   Hx/o elevated LFTs to Lipitor.  Has been on welchol prior too.  Diet - eating healthy.    occasional cheese, avoids red meat.  Eats almonds daily, eats fish, salmon.    Here for f/u on hypertension - 2 wk ago BP was 160/100 at podiatrist.  Doesn't usually take his BP.  Takes Toprol XL daily.  Exercise - 2-3 days per week, some weight training, situps, jumping jacks, walks the dog.  Saw podiatrist recently for plantar fascitis, had cortisone shot, ultrasound of foot.  Used splints as well, has f/u in 27mo.  No current prostate symptoms, no increased frequency, no decreased stream, hesitancy.  No other c/o.  The following portions of the patient's history were reviewed and updated as appropriate: allergies, current medications, past family history, past medical history, past social history, past surgical history and problem list.  ROS Otherwise as in subjective above  Allergies  Allergen Reactions  . Codeine Nausea Only and Other (See Comments)    Dizziness, sweat and will make patient pass out.  . Lipitor (Atorvastatin)     Elevated LFTs, no symptoms    Current Outpatient Prescriptions on File Prior to Visit  Medication Sig Dispense Refill  . aspirin 81 MG tablet Take 81 mg by mouth daily.        . fish oil-omega-3 fatty acids 1000 MG capsule Take 1 g by mouth daily.        . metoprolol succinate (TOPROL-XL) 50 MG 24 hr tablet TAKE 1 TABLET EVERY DAY  30 tablet  11  . mometasone (NASONEX) 50 MCG/ACT nasal spray Place 2 sprays into the nose daily.  17 g  1  . Multiple Vitamin (MULTIVITAMIN) tablet Take 1 tablet by mouth  daily.        . simvastatin (ZOCOR) 10 MG tablet TAKE 1 TABLET BY MOUTH EVERY OTHER DAY  15 tablet  3  . ZETIA 10 MG tablet TAKE 1 TABLET EVERY DAY  30 tablet  5  . cetirizine (ZYRTEC) 10 MG tablet Take 1 tablet (10 mg total) by mouth daily.  30 tablet  4   No current facility-administered medications on file prior to visit.    Past Medical History  Diagnosis Date  . Dyslipidemia   . Arthritis   . Statin intolerance     intolerance with Lipitor  . Hypertension   . Inguinal hernia 2012    right  . Fatty liver   . Abdominal pain     with coffee  . Diverticulosis   . H/O echocardiogram 10/18/2011    transthoracic - mild concentric LVH, EF 55%, mild valvular aortic stenosis, LA mild to moderate dilation, otherwise normal echo; Dr. Alanda Amass  . H/O diagnostic ultrasound 03/06/11    Aortic ultrasound - no AAA identified  . Abdominal ultrasound, abnormal 12/09/09    fatty liver disease  . Heart murmur, aortic     cardiac eval 09/2011, Dr. Allyson Sabal [facility]), mild aortic stenosis  . Allergy     allergy shots, cat allergy; Dr. Sidney Ace;  . Testicular pain 01/08/10    Urology eval,  Dr. Laverle Patter  . Normal cardiac stress test 03/02/07    Myoview, EF 65%; Dr. Carolanne Grumbling  . H/O CT scan of chest 03/20/12    normal  . H/O CT scan of abdomen 03/20/12    abdomen/pelvis - normal    Past Surgical History  Procedure Laterality Date  . Shoulder surgery  2002, 2008    bilat, RTC  . Nose surgery  2005    obstruction, polyp and deviated septum; Dr. Jenne Pane  . Hand surgery  2004    left ganglion cyst removed   . Colonoscopy  10/21/2008    internal hemorrhoids, diverticulosis; repeat 10/2018; Dr. Yancey Flemings, [place]    Family History  Problem Relation Age of Onset  . Cancer Mother     breast  . Dementia Mother 56  . Kidney disease Father 76    kidney failure  . Rheum arthritis Father   . Diabetes Neg Hx   . Heart disease Neg Hx   . Stroke Neg Hx   . Hypertension Neg Hx   . Hyperlipidemia  Neg Hx     History   Social History  . Marital Status: Married    Spouse Name: N/A    Number of Children: N/A  . Years of Education: N/A   Occupational History  . nursing student; part time [employer]   Social History Main Topics  . Smoking status: Never Smoker   . Smokeless tobacco: Never Used  . Alcohol Use: 1.2 oz/week    2 Cans of beer per week     Comment: beer once or twice per week  . Drug Use: No  . Sexually Active: Not on file   Other Topics Concern  . Not on file   Social History Narrative   Married, no children, 2 dogs and a cat, exercise - aerobics, some weight bearing    Reviewed their medical, surgical, family, social, medication, and allergy history and updated chart as appropriate.   Objective: Physical Exam  Vital signs reviewed  General appearance: alert, no distress, WD/WN, white male Neck: supple, no lymphadenopathy, no thyromegaly, no masses, no bruits Heart: 2/6 systolic murmur heard best in upper sternal borders, no radiation, otherwise RRR, normal S1, S2 Lungs: CTA bilaterally, no wheezes, rhonchi, or rales Abdomen: +bs, soft, non tender, non distended, no masses, no hepatomegaly, no splenomegaly Pulses: 2+ radial pulses, 2+ pedal pulses, normal cap refill Ext: no edema DRE: anus normal tone, no nodules, mild enlargement, occult negative stool   Adult ECG Report  Indication: HTN, hx/o aortic stenosis  Rate: 52bpm  Rhythm: sinus bradycardia  QRS Axis: 53 degrees  PR Interval:  QRS Duration:  QTc:  Conduction Disturbances: none and no obvious delta wave (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  EKG comparison: 09/2011  Narrative Interpretation: sinus bradycardia, LVH   Assessment: Encounter Diagnoses  Name Primary?  . Essential hypertension, benign Yes  .  Dyslipidemia   . Aortic stenosis, mild   . Plantar fasciitis   . Allergic rhinitis     Plan: HTN - controlled on Toprol XL, eating healthy, could get more exercise though  Dyslipidemia - doing on on Simvastatin every other day and zetia daily.  Hx/o elevated enzymes with Lipitor. Fasting labs today.  Aortic stenosis - asymptomatic, no worse murmur than prior exams. EKG reviewed today.  reviewed prior records, prior echocardiograms x 2.  discussed the diagnosis, possible complications and worsening over time, symptoms, possible treatment down the road.  Given the new LVH on EKG compared to prior, he will go back and see cardiology at this time for recheck.  Plantar fascitis- followed by podiatry  Allergic rhinitis - allergy shots per Dr. Harrington Callas  Follow up: pending labs

## 2013-03-20 LAB — CK: Total CK: 356 U/L — ABNORMAL HIGH (ref 7–232)

## 2013-03-21 ENCOUNTER — Other Ambulatory Visit: Payer: Self-pay | Admitting: Medical

## 2013-03-25 ENCOUNTER — Other Ambulatory Visit: Payer: Self-pay | Admitting: Medical

## 2013-03-31 ENCOUNTER — Other Ambulatory Visit: Payer: Self-pay | Admitting: Medical

## 2013-04-24 ENCOUNTER — Encounter: Payer: Self-pay | Admitting: Cardiovascular Disease

## 2013-04-24 ENCOUNTER — Ambulatory Visit (INDEPENDENT_AMBULATORY_CARE_PROVIDER_SITE_OTHER): Payer: Medicare Other | Admitting: Cardiovascular Disease

## 2013-04-24 VITALS — BP 140/88 | HR 61 | Ht 71.5 in | Wt 205.0 lb

## 2013-04-24 DIAGNOSIS — E785 Hyperlipidemia, unspecified: Secondary | ICD-10-CM

## 2013-04-24 DIAGNOSIS — I359 Nonrheumatic aortic valve disorder, unspecified: Secondary | ICD-10-CM

## 2013-04-24 DIAGNOSIS — I350 Nonrheumatic aortic (valve) stenosis: Secondary | ICD-10-CM

## 2013-04-24 DIAGNOSIS — I6529 Occlusion and stenosis of unspecified carotid artery: Secondary | ICD-10-CM

## 2013-04-24 DIAGNOSIS — I10 Essential (primary) hypertension: Secondary | ICD-10-CM

## 2013-04-24 NOTE — Patient Instructions (Addendum)
  Your physician wants you to follow-up with him in : 12 MONTHS                                              You will receive a reminder letter in the mail one month in advance. If you don't receive a letter, please call our office to schedule the follow-up appointment.    Your physician has ordered the following tests: ECHOCARDIOGRAM AND CAROTID DOPPLERS

## 2013-04-24 NOTE — Progress Notes (Signed)
04/24/2013 Daniel [name]   September 21, 1946  [id number]  Primary Physician Daniel Herter, MD Primary Cardiologist: Daniel Gess MD Daniel [name]   HPI:  The patient is a 67 year old, fit-appearing, married Caucasian male with no children who works part time doing security work at [employer]. He is referred by Daniel [name] for cardiovascular evaluation because of an auscultated murmur. He saw Daniel [name] remotely. 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 He has rare episodes of tachy palpitations. His cardiovascular risk factor profile is positive for hypertension and hyperlipidemia but otherwise is benign. There is no family history of heart disease. He has never had a heart attack or stroke and denies chest pain or shortness of breath. He had an echo done on 2008 that showed normal LV systolic function with an aortic valve area of 1.79 cm consistent with mild aortic stenosis. He denies chest pain or shortness of breath. It's been a year and a half since his last 2-D echo in 6 years since his last carotid Doppler study.      Current Outpatient Prescriptions  Medication Sig Dispense Refill  . aspirin 81 MG tablet Take 81 mg by mouth daily.        . fish oil-omega-3 fatty acids 1000 MG capsule Take 1 g by mouth daily.        . metoprolol succinate (TOPROL-XL) 50 MG 24 hr tablet TAKE 1 TABLET EVERY DAY  30 tablet  11  . Multiple Vitamin (MULTIVITAMIN) tablet Take 1 tablet by mouth daily.        Marland Kitchen ezetimibe (ZETIA) 10 MG tablet Take 10 mg by mouth daily.      . simvastatin (ZOCOR) 10 MG tablet TAKE 1 TABLET BY MOUTH EVERY OTHER DAY  15 tablet  5   No current facility-administered medications for this visit.    Allergies  Allergen Reactions  . Codeine Nausea Only and Other (See Comments)    Dizziness, sweat and will make patient pass out.  . Lipitor (Atorvastatin)     Elevated LFTs, no symptoms    History   Social History  . Marital Status:  Married    Spouse Name: N/A    Number of Children: N/A  . Years of Education: N/A   Occupational History  . nursing student; part time [employer]   Social History Main Topics  . Smoking status: Never Smoker   . Smokeless tobacco: Never Used  . Alcohol Use: 1.2 oz/week    2 Cans of beer per week     Comment: beer once or twice per week  . Drug Use: No  . Sexually Active: Not on file   Other Topics Concern  . Not on file   Social History Narrative   Married, no children, 2 dogs and a cat, exercise - aerobics, some weight bearing     Review of Systems: General: negative for chills, fever, night sweats or weight changes.  Cardiovascular: negative for chest pain, dyspnea on exertion, edema, orthopnea, palpitations, paroxysmal nocturnal dyspnea or shortness of breath Dermatological: negative for rash Respiratory: negative for cough or wheezing Urologic: negative for hematuria Abdominal: negative for nausea, vomiting, diarrhea, bright red blood per rectum, melena, or hematemesis Neurologic: negative for visual changes, syncope, or dizziness All other systems reviewed and are otherwise negative except as noted above.    Blood pressure 140/88, pulse 61, height 5' 11.5" (1.816 m), weight 205 lb (92.987 kg).  General appearance: alert and no distress  Neck: no adenopathy, no JVD, supple, symmetrical, trachea midline, thyroid not enlarged, symmetric, no tenderness/mass/nodules and soft left carotid bruit Lungs: clear to auscultation bilaterally Heart: soft outflow tract murmur Abdomen: soft, non-tender; bowel sounds normal; no masses,  no organomegaly  EKG sinus rhythm at 61 with evidence of left ventricular hypertrophy  ASSESSMENT AND PLAN:   Aortic stenosis Repeat 2-D echo  Essential hypertension, benign Controlled on current medications  Dyslipidemia Statin drug recently discontinued because of increased LFTs. Dr. Susann Givens is following  Carotid stenosis By  duplex in 2008. We'll repeat carotid Doppler study      Daniel Gess MD [facility], [facility] 04/24/2013 2:31 PM

## 2013-04-24 NOTE — Assessment & Plan Note (Signed)
Controlled on current medications 

## 2013-04-24 NOTE — Assessment & Plan Note (Signed)
Statin drug recently discontinued because of increased LFTs. Dr. Susann Givens is following

## 2013-04-24 NOTE — Assessment & Plan Note (Signed)
By duplex in 2008. We'll repeat carotid Doppler study

## 2013-04-24 NOTE — Assessment & Plan Note (Signed)
Repeat 2-D echo 

## 2013-05-02 ENCOUNTER — Ambulatory Visit (HOSPITAL_COMMUNITY)
Admission: RE | Admit: 2013-05-02 | Discharge: 2013-05-02 | Disposition: A | Discharge status: Home / Self Care | Payer: Medicare Other | Source: Ambulatory Visit | Attending: Internal Medicine | Admitting: Internal Medicine

## 2013-05-02 ENCOUNTER — Ambulatory Visit (HOSPITAL_BASED_OUTPATIENT_CLINIC_OR_DEPARTMENT_OTHER)
Admission: RE | Admit: 2013-05-02 | Discharge: 2013-05-02 | Disposition: A | Discharge status: Home / Self Care | Payer: Medicare Other | Source: Ambulatory Visit | Attending: Internal Medicine | Admitting: Internal Medicine

## 2013-05-02 DIAGNOSIS — I10 Essential (primary) hypertension: Secondary | ICD-10-CM | POA: Insufficient documentation

## 2013-05-02 DIAGNOSIS — E785 Hyperlipidemia, unspecified: Secondary | ICD-10-CM | POA: Insufficient documentation

## 2013-05-02 DIAGNOSIS — I080 Rheumatic disorders of both mitral and aortic valves: Secondary | ICD-10-CM | POA: Insufficient documentation

## 2013-05-02 DIAGNOSIS — I350 Nonrheumatic aortic (valve) stenosis: Secondary | ICD-10-CM

## 2013-05-02 DIAGNOSIS — I359 Nonrheumatic aortic valve disorder, unspecified: Secondary | ICD-10-CM

## 2013-05-02 DIAGNOSIS — I079 Rheumatic tricuspid valve disease, unspecified: Secondary | ICD-10-CM | POA: Insufficient documentation

## 2013-05-02 DIAGNOSIS — I6529 Occlusion and stenosis of unspecified carotid artery: Secondary | ICD-10-CM

## 2013-05-02 DIAGNOSIS — I379 Nonrheumatic pulmonary valve disorder, unspecified: Secondary | ICD-10-CM | POA: Insufficient documentation

## 2013-05-02 NOTE — Progress Notes (Signed)
[facility]   2D echo completed 05/02/2013.   Cindy Henlee Donovan, RDCS  

## 2013-05-02 NOTE — Progress Notes (Signed)
Carotid Duplex Completed. Odetta Forness, RDMS, RVT  

## 2013-05-08 ENCOUNTER — Ambulatory Visit: Payer: Medicare Other

## 2013-05-08 LAB — CK: Total CK: 418 U/L — ABNORMAL HIGH (ref 7–232)

## 2013-05-13 ENCOUNTER — Telehealth: Payer: Self-pay | Admitting: Cardiovascular Disease

## 2013-05-13 NOTE — Telephone Encounter (Signed)
Mr.Hufstetler is calling about his results of his ultrasound and ekg.Marland Kitchen Please calll  thanks

## 2013-05-13 NOTE — Telephone Encounter (Signed)
Message forwarded to K. Vogel, RN.  

## 2013-05-14 ENCOUNTER — Other Ambulatory Visit: Payer: Self-pay | Admitting: Medical

## 2013-05-14 ENCOUNTER — Telehealth: Payer: Self-pay | Admitting: *Deleted

## 2013-05-14 DIAGNOSIS — R748 Abnormal levels of other serum enzymes: Secondary | ICD-10-CM

## 2013-05-14 NOTE — Telephone Encounter (Signed)
Results given.

## 2013-05-14 NOTE — Telephone Encounter (Signed)
Pt notified of echocardiogram results.  I will check on carotid doppler results

## 2013-05-14 NOTE — Telephone Encounter (Signed)
Message copied by Marella Bile on Tue May 14, 2013 12:44 PM ------      Message from: Runell Gess      Created: Tue May 14, 2013 12:30 PM      Regarding: 2D       Shows nl LV function, diastolic dysfunction (stiff heart) and mild AS ------

## 2013-06-04 ENCOUNTER — Other Ambulatory Visit (INDEPENDENT_AMBULATORY_CARE_PROVIDER_SITE_OTHER): Payer: Medicare Other

## 2013-06-04 DIAGNOSIS — Z111 Encounter for screening for respiratory tuberculosis: Secondary | ICD-10-CM

## 2013-06-04 DIAGNOSIS — R748 Abnormal levels of other serum enzymes: Secondary | ICD-10-CM

## 2013-06-04 NOTE — Addendum Note (Signed)
Addended by: Janeice Robinson on: 06/04/2013 02:53 PM   Modules accepted: Orders

## 2013-06-06 LAB — CK ISOENZYMES
CK-BB: 0 %
CK-MM: 98.5 % (ref 95–100)
Total CK: 411 U/L — ABNORMAL HIGH (ref 7–232)

## 2013-06-06 LAB — TB SKIN TEST: TB Skin Test: NEGATIVE

## 2013-06-19 ENCOUNTER — Other Ambulatory Visit (INDEPENDENT_AMBULATORY_CARE_PROVIDER_SITE_OTHER): Payer: Medicare Other

## 2013-06-19 DIAGNOSIS — Z111 Encounter for screening for respiratory tuberculosis: Secondary | ICD-10-CM

## 2013-06-21 ENCOUNTER — Ambulatory Visit (INDEPENDENT_AMBULATORY_CARE_PROVIDER_SITE_OTHER): Payer: Medicare Other | Admitting: Medical

## 2013-06-21 DIAGNOSIS — T148 Other injury of unspecified body region: Secondary | ICD-10-CM

## 2013-06-21 DIAGNOSIS — R748 Abnormal levels of other serum enzymes: Secondary | ICD-10-CM

## 2013-06-21 DIAGNOSIS — M5380 Other specified dorsopathies, site unspecified: Secondary | ICD-10-CM

## 2013-06-21 DIAGNOSIS — M62830 Muscle spasm of back: Secondary | ICD-10-CM

## 2013-06-21 DIAGNOSIS — W57XXXA Bitten or stung by nonvenomous insect and other nonvenomous arthropods, initial encounter: Secondary | ICD-10-CM

## 2013-06-21 NOTE — Progress Notes (Signed)
Subjective: Here for discussion about elevated CK.  His CK continues to be mildly elevated.  Feels fine in general, has some aches and pains, but no major c/o today.  He notes that his dad has a hx/o RA, his mother had RA.  He saw rheumatologist a few years ago, was advised that he did not have RA, had evaluation.  He also notes having mildly elevated CK for several years now that haven't seemed to changes on or off statins.      He denies shoulder or hip aches or weakness.  For years gets pains in bottom of feet, balls of feet.  Can't go barefooted.  Sees podiatry for plantar fascitis.   He notes frequent spasm in low backs.  Has seen ortho for this.  Has had xrays, other tests.  Saw [facility], Dr. Simonne Come initially, but sees somebody else now since Dr. Simonne Come retired.   Both thumbs have been hurting.   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   Of note had been on Welchol prior with no improvement.   His most recent regimen for cholesterol was daily Zetia, and Simvastatin every other day.  He stopped this 2 months ago when the CK was elevated.  He notes some insect bites on his legs  and right thumb he wants me to look at.     Past Medical History  Diagnosis Date  . Dyslipidemia   . Arthritis   . Statin intolerance     intolerance with Lipitor  . Hypertension   . Inguinal hernia 2012    right  . Fatty liver   . Abdominal pain     with coffee  . Diverticulosis   . H/O echocardiogram 10/18/2011    transthoracic - mild concentric LVH, EF 55%, mild valvular aortic stenosis, LA mild to moderate dilation, otherwise normal echo; Dr. Alanda Amass  . H/O diagnostic ultrasound 03/06/11    Aortic ultrasound - no AAA identified  . Abdominal ultrasound, abnormal 12/09/09    fatty liver disease  . Heart murmur, aortic     cardiac eval 09/2011, Dr. Allyson Sabal [facility] mild aortic stenosis  . Allergy     allergy shots, cat allergy; Dr. Sidney Ace;  .  Testicular pain 01/08/10    Urology eval, Dr. Laverle Patter  . Normal cardiac stress test 03/02/07    Myoview, EF 65%; Dr. Carolanne Grumbling  . H/O CT scan of chest 03/20/12    normal  . H/O CT scan of abdomen 03/20/12    abdomen/pelvis - normal  . Abnormal carotid ultrasound 03/01/07    R no ICA stenosis. Left 40-60% ICA stenosis, no significant plaques   ROS as in subjective  Objective: Gen: wd, wn, nad Skin: right dorsal base of thumb with roundish 4mm raw erythematous ulceration, otherwise several small 1-2 mm round red papular lesions of both legs.   Assessment: Encounter Diagnoses  Name Primary?  . Elevated CK Yes  . Insect bite   . Back spasm    Plan: Elevated CK - I recently talked with Dr. Truslow/rheumatology about his case.  After discussing his chronic mild elevations of CK without obvious cause, we will go ahead and start him back on simvastatin every other day, daily Zetia, and recheck labs in 33mo (hepatic panel, CK, LDH, TSH, lipid).  Insect bite - c/t neosporin to thumb wound/bite, but hydrocortisone OTC to the other bites, wear pants in the evening outside, use Off or other insect  repellant  Back spasm - advised he consider lap swimming for cardio and low impact, c/t daily stretching routine and exercise he is doing.   F/u 69mo

## 2013-07-01 ENCOUNTER — Other Ambulatory Visit: Payer: Self-pay | Admitting: Medical

## 2013-07-01 NOTE — Telephone Encounter (Signed)
Is this patient back on this medication?

## 2013-07-07 ENCOUNTER — Other Ambulatory Visit: Payer: Self-pay | Admitting: Medical

## 2013-09-04 ENCOUNTER — Other Ambulatory Visit: Payer: Self-pay | Admitting: Medical

## 2013-09-11 ENCOUNTER — Ambulatory Visit: Payer: Medicare Other | Admitting: Podiatry

## 2013-11-12 ENCOUNTER — Encounter: Payer: Self-pay | Admitting: Internal Medicine

## 2013-12-10 ENCOUNTER — Ambulatory Visit (INDEPENDENT_AMBULATORY_CARE_PROVIDER_SITE_OTHER): Payer: Medicare Other | Admitting: Medical

## 2013-12-10 ENCOUNTER — Encounter: Payer: Self-pay | Admitting: Medical

## 2013-12-10 VITALS — BP 130/88 | HR 60 | Temp 97.7°F | Resp 16 | Wt 209.0 lb

## 2013-12-10 DIAGNOSIS — R3129 Other microscopic hematuria: Secondary | ICD-10-CM

## 2013-12-10 LAB — POCT URINALYSIS DIPSTICK
Bilirubin, UA: NEGATIVE
GLUCOSE UA: NEGATIVE
Ketones, UA: NEGATIVE
Leukocytes, UA: NEGATIVE
NITRITE UA: NEGATIVE
Spec Grav, UA: <=1.005
UROBILINOGEN UA: 1
pH, UA: 7

## 2013-12-10 NOTE — Progress Notes (Signed)
  Subjective:  Daniel [name] is a 68 y.o. male who presents "blood in urine."  December 1st was in phlebotomy course in school, they drew blood on each other and dipstick urine.  His showed microscopic blood.  Since then he has noticed mild discomfort and burning on the first morning urine.   Has a normal stream, but sometimes feels the need to push.  Has had a few episodes of intermittent throbbing in shaft of penis .  Urine is more concentrated even after drinking plenty of water.  Has hx/o 1 UTI in the remote past.  Denies hx/o prostrate problems, no testicular pain, no abdominal pain, no back pain, no NVD or constipation.  No penile discharge.  No urinary dribbling.  No hesitancy.  No urinary odor, no cloudy urine.  No rash.  No pain with intercourse no ejaculatory changes.  Exercise - aerobics.  Has seen urologist in the past for lower abdominal pain.  Nothing abnormal per urology at that time.   Non smoker.  No other aggravating or relieving factors.   Denies getting up multiple times at night to urinate, no urine stream changes. No other c/o.  The following portions of the patient's history were reviewed and updated as appropriate: allergies, current medications, past family history, past medical history, past social history, past surgical history and problem list.  ROS Otherwise as in subjective above  Objective: Physical Exam  BP 130/88  Pulse 60  Temp(Src) 97.7 F (36.5 C) (Oral)  Resp 16  Wt 209 lb (94.802 kg)   General appearance: alert, no distress, WD/WN Abdomen: +bs, soft, non tender, non distended, no masses, no hepatomegaly, no splenomegaly Back: nontender GU: Normal male genitalia, circumcised, no mass no rash no lymphadenopathy, nontender Pulses: 2+ radial pulses, 2+ pedal pulses, normal cap refill Ext: no edema Rectal and prostate exam deferred today given recent exam a few months ago, and he declines exam  today   Assessment: Encounter Diagnosis  Name Primary?  . Microscopic hematuria Yes     Plan: He had some recent symptoms that may suggest possible infection, but mostly asymptomatic at this time. Discussed possible causes. Urine microscopic today with no significant findings, no obvious RBCs on microscopic either.  We'll send urine for culture.  Follow up: pending culture.

## 2013-12-12 LAB — URINE CULTURE
Colony Count: NO GROWTH
ORGANISM ID, BACTERIA: NO GROWTH

## 2014-01-09 ENCOUNTER — Telehealth: Payer: Self-pay | Admitting: Internal Medicine

## 2014-01-09 ENCOUNTER — Other Ambulatory Visit (INDEPENDENT_AMBULATORY_CARE_PROVIDER_SITE_OTHER): Payer: Medicare Other

## 2014-01-09 DIAGNOSIS — R3129 Other microscopic hematuria: Secondary | ICD-10-CM

## 2014-01-09 LAB — POCT URINALYSIS DIPSTICK
BILIRUBIN UA: NEGATIVE
Glucose, UA: NEGATIVE
Ketones, UA: NEGATIVE
Leukocytes, UA: NEGATIVE
NITRITE UA: NEGATIVE
PH UA: 8
Spec Grav, UA: <=1.005
Urobilinogen, UA: NEGATIVE

## 2014-01-09 NOTE — Telephone Encounter (Signed)
Pt had trace of protein,and trace of blood in urine. Do you want me to send this for culture

## 2014-01-10 ENCOUNTER — Other Ambulatory Visit: Payer: Medicare Other

## 2014-01-13 ENCOUNTER — Ambulatory Visit (INDEPENDENT_AMBULATORY_CARE_PROVIDER_SITE_OTHER): Payer: Medicare Other | Admitting: Family Medicine

## 2014-01-13 ENCOUNTER — Encounter: Payer: Self-pay | Admitting: Family Medicine

## 2014-01-13 VITALS — BP 150/84 | HR 66 | Wt 208.0 lb

## 2014-01-13 DIAGNOSIS — N410 Acute prostatitis: Secondary | ICD-10-CM | POA: Diagnosis not present

## 2014-01-13 LAB — POCT URINALYSIS DIPSTICK
BILIRUBIN UA: NEGATIVE
GLUCOSE UA: NEGATIVE
Ketones, UA: NEGATIVE
Leukocytes, UA: NEGATIVE
Nitrite, UA: NEGATIVE
UROBILINOGEN UA: NEGATIVE
pH, UA: 7

## 2014-01-13 MED ORDER — SULFAMETHOXAZOLE-TMP DS 800-160 MG PO TABS: 1.0000 | ORAL_TABLET | Freq: Two times a day (BID) | ORAL | Status: DC

## 2014-01-13 NOTE — Progress Notes (Signed)
   Subjective:    Patient ID: Daniel [name], male    DOB: 10/16/1946, 67 y.o.   MRN: [id number]  HPI He has a two month history of dysuria and did in the past also have difficulty with hematuria. He was seen here and culture was done which was negative. He continues to have lower abdominal and perineal discomfort as well as some urinary symptoms.   Review of Systems     Objective:   Physical Exam Page Spiro and in no distress. Rectal exam does show a tender boggy prostate. Urine microscopic was negative however the dipstick was positive.       Assessment & Plan:  Acute prostatitis - Plan: sulfamethoxazole-trimethoprim (BACTRIM DS) 800-160 MG per tablet, POCT urinalysis dipstick  he is to call me in 2 weeks if still having difficulty for possible repeat course of antibiotics.

## 2014-01-13 NOTE — Patient Instructions (Signed)
Take the full 2 weeks of the antibiotic and if you are still having any symptoms of any kind let he know and I will renew it.

## 2014-01-13 NOTE — Telephone Encounter (Signed)
Pt was seen today by [employer]

## 2014-01-20 ENCOUNTER — Encounter: Payer: Self-pay | Admitting: Family Medicine

## 2014-01-20 ENCOUNTER — Ambulatory Visit (INDEPENDENT_AMBULATORY_CARE_PROVIDER_SITE_OTHER): Payer: Medicare Other | Admitting: Family Medicine

## 2014-01-20 VITALS — BP 110/80 | HR 80 | Wt 204.0 lb

## 2014-01-20 DIAGNOSIS — L739 Follicular disorder, unspecified: Secondary | ICD-10-CM

## 2014-01-20 DIAGNOSIS — N410 Acute prostatitis: Secondary | ICD-10-CM | POA: Diagnosis not present

## 2014-01-20 DIAGNOSIS — L738 Other specified follicular disorders: Secondary | ICD-10-CM | POA: Diagnosis not present

## 2014-01-20 DIAGNOSIS — L678 Other hair color and hair shaft abnormalities: Secondary | ICD-10-CM

## 2014-01-20 MED ORDER — SULFAMETHOXAZOLE-TMP DS 800-160 MG PO TABS: 1.0000 | ORAL_TABLET | Freq: Two times a day (BID) | ORAL | Status: DC

## 2014-01-20 NOTE — Progress Notes (Signed)
   Subjective:    Patient ID: Daniel [name], male    DOB: September 23, 1946, 68 y.o.   MRN: [id number]  HPI He has a lesion present on his buttock that he noted occurred after being bitten by a tick. He did show me the size of the tick and it was a sizable dog tick. Since then he has had intermittent redness and slight discomfort with itching over the last month. He continues on his Bactrim for treatment of his prostatitis and states he is roughly 80% better.   Review of Systems     Objective:   Physical Exam Erythematous 1 cm raised lesion is noted on the left buttock.       Assessment & Plan:  Acute prostatitis - Plan: sulfamethoxazole-trimethoprim (BACTRIM DS) 800-160 MG per tablet  Folliculitis  he will continue on the Bactrim for another 2 weeks and then return for recheck on his urine. I explained that this tick bite could possibly be a low-grade infectious process but it is very limited and therefore further intervention is probably not needed. He was comfortable with this.

## 2014-03-03 ENCOUNTER — Telehealth: Payer: Self-pay | Admitting: Family Medicine

## 2014-03-03 DIAGNOSIS — N410 Acute prostatitis: Secondary | ICD-10-CM

## 2014-03-03 MED ORDER — SULFAMETHOXAZOLE-TMP DS 800-160 MG PO TABS: 1.0000 | ORAL_TABLET | Freq: Two times a day (BID) | ORAL | Status: DC

## 2014-03-03 NOTE — Telephone Encounter (Signed)
He is starting to have some dysuria again. I will give a two-week the antibiotic and have him return here at that time for repeat urinalysis.

## 2014-03-03 NOTE — Telephone Encounter (Signed)
Pt called and stated issues with uti have returned. Pt is requesting a refill on medication he received last time which was Bactrim DS. Pt uses [facility] [address]

## 2014-03-22 ENCOUNTER — Other Ambulatory Visit: Payer: Self-pay | Admitting: Medical

## 2014-03-24 ENCOUNTER — Encounter: Payer: Self-pay | Admitting: Family Medicine

## 2014-03-24 ENCOUNTER — Ambulatory Visit (INDEPENDENT_AMBULATORY_CARE_PROVIDER_SITE_OTHER): Payer: Medicare Other | Admitting: Family Medicine

## 2014-03-24 VITALS — BP 130/80 | HR 60 | Wt 204.0 lb

## 2014-03-24 DIAGNOSIS — T50905A Adverse effect of unspecified drugs, medicaments and biological substances, initial encounter: Principal | ICD-10-CM

## 2014-03-24 DIAGNOSIS — L42 Pityriasis rosea: Secondary | ICD-10-CM

## 2014-03-24 NOTE — Patient Instructions (Signed)
Cortizone cream for the scrotal area is probably the easiest to use. Benadryl at night for the itching and either Allegra or Claritin during the day for the itching

## 2014-03-24 NOTE — Progress Notes (Signed)
   Subjective:    Patient ID: Daniel [name], male    DOB: 03/25/1946, 68 y.o.   MRN: [id number]  HPI He is here for recheck. He states that his urologic symptoms have totally cleared up. He has noted however a rash present mainly on his torso but also some erythematous pruritic type rash present on his scrotum. He does not give a history of an initial lesion. He has been using warm soaks however it makes it worse. He recently did finish a course of antibiotics for treatment of his urinary symptoms.   Review of Systems     Objective:   Physical Exam Alert and in no distress. Multiple flat lesions are noted on the torso that fall within the skin fold lines. His back is normal. Scrotum does appear slightly erythematous. No lesions seen on the back or legs.       Assessment & Plan:  Pityriasis rosea-like drug eruption  explained that the rash looked like pityriasis rosea however the medication on board this could have been a reaction from that. Explained that this will probably go away with time and did recommend and heparin next as well as a cortisone cream for the scrotum. He will call if further difficulties.

## 2014-04-19 ENCOUNTER — Other Ambulatory Visit: Payer: Self-pay | Admitting: Medical

## 2014-04-22 ENCOUNTER — Other Ambulatory Visit: Payer: Self-pay | Admitting: Dermatology

## 2014-04-25 ENCOUNTER — Other Ambulatory Visit: Payer: Self-pay | Admitting: Medical

## 2014-04-29 ENCOUNTER — Other Ambulatory Visit: Payer: Self-pay | Admitting: Medical

## 2014-05-29 ENCOUNTER — Other Ambulatory Visit: Payer: Self-pay | Admitting: Family Medicine

## 2014-05-29 ENCOUNTER — Other Ambulatory Visit: Payer: Self-pay | Admitting: Medical

## 2014-06-13 ENCOUNTER — Telehealth: Payer: Self-pay | Admitting: Medical

## 2014-06-13 ENCOUNTER — Ambulatory Visit (INDEPENDENT_AMBULATORY_CARE_PROVIDER_SITE_OTHER): Payer: Medicare Other | Admitting: Medical

## 2014-06-13 ENCOUNTER — Encounter: Payer: Self-pay | Admitting: Medical

## 2014-06-13 VITALS — BP 120/80 | HR 62 | Temp 97.8°F | Resp 16 | Wt 206.0 lb

## 2014-06-13 DIAGNOSIS — M7989 Other specified soft tissue disorders: Secondary | ICD-10-CM

## 2014-06-13 DIAGNOSIS — T63441A Toxic effect of venom of bees, accidental (unintentional), initial encounter: Secondary | ICD-10-CM

## 2014-06-13 DIAGNOSIS — T6391XA Toxic effect of contact with unspecified venomous animal, accidental (unintentional), initial encounter: Secondary | ICD-10-CM

## 2014-06-13 DIAGNOSIS — R21 Rash and other nonspecific skin eruption: Secondary | ICD-10-CM

## 2014-06-13 DIAGNOSIS — Z889 Allergy status to unspecified drugs, medicaments and biological substances status: Secondary | ICD-10-CM

## 2014-06-13 DIAGNOSIS — T63461A Toxic effect of venom of wasps, accidental (unintentional), initial encounter: Secondary | ICD-10-CM

## 2014-06-13 MED ORDER — PREDNISONE 20 MG PO TABS: ORAL_TABLET | ORAL | Status: DC

## 2014-06-13 NOTE — Telephone Encounter (Signed)
Patient is aware and he has an appointment today. CLS

## 2014-06-13 NOTE — Telephone Encounter (Signed)
Have him come in and we can decide on treatment.

## 2014-06-13 NOTE — Patient Instructions (Signed)
  Thank you for giving me the opportunity to serve you today.    Your diagnosis today includes: Encounter Diagnoses  Name Primary?  . Bee sting reaction, accidental or unintentional, initial encounter Yes  . Leg swelling   . Rash and nonspecific skin eruption      Specific recommendations today include:  Over the weekend elevate the leg when possible  Use Benadryl up to every 6 hours.  For example use 25-50 mg of Benadryl at bedtime, 12.5 mg to 25 mg during the day  Currently you have a localized bee sting reaction without anaphylaxis or significant urticaria or angioedema  Ice the leg in a bucket of ice water/cold water, 15-20 minutes at a time, 2-4 times daily  Begin Prednisone 20mg , 3 tablets today, 2 tablets tomorrow, 1 tablet Sunday  If you develop facial swelling, shortness of breath wheezing, then use your EpiPen and call 911 or go to the hospital  You may use the neosporin ointment on the penis rash twice daily for another 4-5 days.  Return if symptoms worsen or fail to improve.

## 2014-06-13 NOTE — Progress Notes (Signed)
   Subjective:    Patient ID: Daniel [name], male    DOB: [birth year], 68 y.o.   MRN: [id number]  HPI  Patient is presenting for a yellow-jacket sting yesterday morning over his medial right ankle. His ankle felt pretty painful afterward. Associated symptoms include swelling over the medial right ankle, pain with walking, soreness, tightness in his ankle. He also had nausea yesterday night. Patient tried putting ice over his ankle without relief but admits he didn't do it for long since the cold was a bit painful. He also tried some elevation, aspirin and benadryl once last night without a significant change. Denies seeing a stinger, bleeding, drainage at site of sting, no fevers, vomiting, abdominal pain, chest pain, sob, difficulty breathing. In the past, the patient has been stung by yellow jackets and usually gets very localized swelling that resolves quickly without much discomfort. Patient denies previous anaphylactic reactions to foods or other stings. Patient is allergic to codeine (nausea, vomiting) and sulfa drugs (skin rash). Believes last tetanus shot was within 5 years but not sure.  Recent history he wanted me to be aware of.   Saw Dr. Redmond School recently, treated with bactrim for prostatitis.   Ended up seeing Urology but he was much better. However, he did end up having allergic reaction to Bactrim, saw dermatology for this.  No other aggravating or relieving factors.  Review of Systems As in subjective.    Objective:   Physical Exam  BP 120/80  Pulse 62  Temp(Src) 97.8 F (36.6 C) (Oral)  Resp 16  Wt 206 lb (93.441 kg)  General appearance: alert, no distress, WD/WN Oral/HENT - no obvious swelling, no angioedema Lungs clear Lower ext: right ankle and distal 1/3 of lower leg with general swelling, 1+ nonpitting edema, slight area of erythema of medial right ankle and central marking suggesting bite/sting.   No warmth, no induration.  No drainage.  Penis: volar glans with dry  small patch of erythema Pulses: 2+ symmetric dorsalis pedis, normal cap refill      Assessment & Plan:   Encounter Diagnoses  Name Primary?  . Bee sting reaction, accidental or unintentional, initial encounter Yes  . Leg swelling   . Rash and nonspecific skin eruption   . Drug allergy    Discussed the findings, concerns.  Last Tdap 2011.  Specific recommendations today include:  Over the weekend elevate the leg when possible  Use Benadryl up to every 6 hours.  For example use 25-50 mg of Benadryl at bedtime, 12.5 mg to 25 mg during the day  Currently you have a localized bee sting reaction without anaphylaxis or significant urticaria or angioedema  Ice the leg in a bucket of ice water/cold water, 15-20 minutes at a time, 2-4 times daily  Begin Prednisone 20mg , 3 tablets today, 2 tablets tomorrow, 1 tablet Sunday  If you develop facial swelling, shortness of breath wheezing, then use your EpiPen and call 911 or go to the hospital  You may use the neosporin ointment on the penis rash twice daily for another 4-5 days.  Patient was seen in conjunction with PA student Jaynee Eagles, and I have also evaluated and examined patient, agree with student's notes, student supervised by me.

## 2014-06-27 ENCOUNTER — Telehealth: Payer: Self-pay | Admitting: Family Medicine

## 2014-07-01 ENCOUNTER — Other Ambulatory Visit: Payer: Self-pay | Admitting: Medical

## 2014-07-01 ENCOUNTER — Other Ambulatory Visit: Payer: Self-pay | Admitting: Family Medicine

## 2014-07-02 NOTE — Telephone Encounter (Signed)
Left message for pt

## 2014-07-02 NOTE — Telephone Encounter (Signed)
Called pharmacy & they said pt picked up 30 days yesterday for $0 co pay.  Said with both insurances is $0 co pay but for only 30 days at a time.

## 2014-07-30 ENCOUNTER — Ambulatory Visit (INDEPENDENT_AMBULATORY_CARE_PROVIDER_SITE_OTHER): Payer: Medicare Other | Admitting: Family Medicine

## 2014-07-30 ENCOUNTER — Encounter: Payer: Self-pay | Admitting: Family Medicine

## 2014-07-30 VITALS — BP 126/84 | HR 60 | Ht 72.0 in | Wt 204.0 lb

## 2014-07-30 DIAGNOSIS — E785 Hyperlipidemia, unspecified: Secondary | ICD-10-CM

## 2014-07-30 DIAGNOSIS — Z125 Encounter for screening for malignant neoplasm of prostate: Secondary | ICD-10-CM

## 2014-07-30 DIAGNOSIS — I10 Essential (primary) hypertension: Secondary | ICD-10-CM

## 2014-07-30 DIAGNOSIS — Z0000 Encounter for general adult medical examination without abnormal findings: Secondary | ICD-10-CM

## 2014-07-30 DIAGNOSIS — I350 Nonrheumatic aortic (valve) stenosis: Secondary | ICD-10-CM

## 2014-07-30 DIAGNOSIS — I6529 Occlusion and stenosis of unspecified carotid artery: Secondary | ICD-10-CM

## 2014-07-30 DIAGNOSIS — I359 Nonrheumatic aortic valve disorder, unspecified: Secondary | ICD-10-CM

## 2014-07-30 LAB — CBC WITH DIFFERENTIAL/PLATELET
Basophils Absolute: 0 10*3/uL (ref 0.0–0.1)
Basophils Relative: 1 % (ref 0–1)
EOS ABS: 0.1 10*3/uL (ref 0.0–0.7)
EOS PCT: 3 % (ref 0–5)
HEMATOCRIT: 45.6 % (ref 39.0–52.0)
HEMOGLOBIN: 15.6 g/dL (ref 13.0–17.0)
LYMPHS ABS: 1.8 10*3/uL (ref 0.7–4.0)
Lymphocytes Relative: 40 % (ref 12–46)
MCH: 31.3 pg (ref 26.0–34.0)
MCHC: 34.2 g/dL (ref 30.0–36.0)
MCV: 91.4 fL (ref 78.0–100.0)
MONO ABS: 0.4 10*3/uL (ref 0.1–1.0)
MONOS PCT: 10 % (ref 3–12)
Neutro Abs: 2 10*3/uL (ref 1.7–7.7)
Neutrophils Relative %: 46 % (ref 43–77)
Platelets: 148 10*3/uL — ABNORMAL LOW (ref 150–400)
RBC: 4.99 MIL/uL (ref 4.22–5.81)
RDW: 13.8 % (ref 11.5–15.5)
WBC: 4.4 10*3/uL (ref 4.0–10.5)

## 2014-07-30 MED ORDER — METOPROLOL SUCCINATE ER 50 MG PO TB24: ORAL_TABLET | ORAL | Status: DC

## 2014-07-30 MED ORDER — SIMVASTATIN 10 MG PO TABS: ORAL_TABLET | ORAL | Status: DC

## 2014-07-30 MED ORDER — EZETIMIBE 10 MG PO TABS: 10.0000 mg | ORAL_TABLET | Freq: Every day | ORAL | Status: DC

## 2014-07-30 NOTE — Progress Notes (Signed)
   Subjective:    Patient ID: Daniel [name], male    DOB: Jan 05, 1946, 68 y.o.   MRN: [id number]  HPI He is here for complete examination. 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 He also recently was diagnosed with sulfa allergy. He does have underlying hypertension as well as aortic stenosis. He has seen cardiology in the past for this. He also has hyperlipidemia and is on medication for this. He is no longer having difficulty with abdominal pain. He keeps himself active. His work is going well although he is looking for another part-time position. Home life is going well. Family and social history was otherwise reviewed. Immunizations and health maintenance were reviewed. He does exercise regularly.  Review of Systems  All other systems reviewed and are negative.      Objective:   Physical Exam BP 126/84  Pulse 60  Ht 6' (1.829 m)  Wt 204 lb (92.534 kg)  BMI 27.66 kg/m2  General Appearance:    Alert, cooperative, no distress, appears stated age  Head:    Normocephalic, without obvious abnormality, atraumatic  Eyes:    PERRL, conjunctiva/corneas clear, EOM's intact, fundi    benign  Ears:    Normal TM's and external ear canals  Nose:   Nares normal, mucosa normal, no drainage or sinus   tenderness  Throat:   Lips, mucosa, and tongue normal; teeth and gums normal  Neck:   Supple, no lymphadenopathy;  thyroid:  no   enlargement/tenderness/nodules; no carotid   bruit or JVD  Back:    Spine nontender, no curvature, ROM normal, no CVA     tenderness  Lungs:     Clear to auscultation bilaterally without wheezes, rales or     ronchi; respirations unlabored  Chest Wall:    No tenderness or deformity   Heart:    Regular rate and rhythm, S1 and S2 normal, 2/6 sem in the aortic area   Breast Exam:    No chest wall tenderness, masses or gynecomastia  Abdomen:     Soft, non-tender, nondistended, normoactive bowel sounds,    no masses, no hepatosplenomegaly    Genitalia:    Normal male external genitalia without lesions.  Testicles without masses.  No inguinal hernias.  Rectal:    Normal sphincter tone, no masses or tenderness; guaiac negative stool.  Prostate smooth, no nodules, not enlarged.  Extremities:   No clubbing, cyanosis or edema  Pulses:   2+ and symmetric all extremities  Skin:   Skin color, texture, turgor normal, no rashes or lesions  Lymph nodes:   Cervical, supraclavicular, and axillary nodes normal  Neurologic:   CNII-XII intact, normal strength, sensation and gait; reflexes 2+ and symmetric throughout          Psych:   Normal mood, affect, hygiene and grooming.          Assessment & Plan:  Essential hypertension, benign - Plan: CBC with Differential, Comprehensive metabolic panel  Dyslipidemia - Plan: Lipid panel  Carotid stenosis, unspecified laterality - Plan: CBC with Differential, Comprehensive metabolic panel  Aortic stenosis - Plan: CBC with Differential, Comprehensive metabolic panel  Special screening for malignant neoplasm of prostate - Plan: PSA, Medicare  Routine general medical examination at a health care facility - Plan: CBC with Differential, Comprehensive metabolic panel, Lipid panel  I encouraged him to continue to take good care of himself.

## 2014-07-31 LAB — COMPREHENSIVE METABOLIC PANEL
ALT: 29 U/L (ref 0–53)
AST: 29 U/L (ref 0–37)
Albumin: 4.4 g/dL (ref 3.5–5.2)
Alkaline Phosphatase: 49 U/L (ref 39–117)
BILIRUBIN TOTAL: 0.7 mg/dL (ref 0.2–1.2)
BUN: 23 mg/dL (ref 6–23)
CALCIUM: 9.7 mg/dL (ref 8.4–10.5)
CO2: 27 meq/L (ref 19–32)
CREATININE: 1.36 mg/dL — AB (ref 0.50–1.35)
Chloride: 107 mEq/L (ref 96–112)
GLUCOSE: 94 mg/dL (ref 70–99)
Potassium: 4.1 mEq/L (ref 3.5–5.3)
Sodium: 142 mEq/L (ref 135–145)
TOTAL PROTEIN: 7 g/dL (ref 6.0–8.3)

## 2014-07-31 LAB — LIPID PANEL
CHOL/HDL RATIO: 4.2 ratio
CHOLESTEROL: 191 mg/dL (ref 0–200)
HDL: 45 mg/dL (ref >39)
LDL Cholesterol: 123 mg/dL — ABNORMAL HIGH (ref 0–99)
TRIGLYCERIDES: 115 mg/dL (ref <150)
VLDL: 23 mg/dL (ref 0–40)

## 2014-07-31 LAB — PSA, MEDICARE: PSA: 1.91 ng/mL (ref <=4.00)

## 2014-09-08 ENCOUNTER — Encounter: Payer: Self-pay | Admitting: Internal Medicine

## 2014-09-10 ENCOUNTER — Encounter: Payer: Self-pay | Admitting: Internal Medicine

## 2015-04-21 ENCOUNTER — Encounter: Payer: Self-pay | Admitting: Medical

## 2015-04-21 ENCOUNTER — Ambulatory Visit (INDEPENDENT_AMBULATORY_CARE_PROVIDER_SITE_OTHER): Payer: Medicare Other | Admitting: Medical

## 2015-04-21 VITALS — BP 120/80 | HR 66 | Resp 15 | Wt 206.0 lb

## 2015-04-21 DIAGNOSIS — R030 Elevated blood-pressure reading, without diagnosis of hypertension: Secondary | ICD-10-CM

## 2015-04-21 DIAGNOSIS — H68019 Acute Eustachian salpingitis, unspecified ear: Secondary | ICD-10-CM | POA: Diagnosis not present

## 2015-04-21 DIAGNOSIS — I10 Essential (primary) hypertension: Secondary | ICD-10-CM | POA: Diagnosis not present

## 2015-04-21 DIAGNOSIS — R42 Dizziness and giddiness: Secondary | ICD-10-CM

## 2015-04-21 MED ORDER — MECLIZINE HCL 25 MG PO TABS: 25.0000 mg | ORAL_TABLET | Freq: Two times a day (BID) | ORAL | Status: DC

## 2015-04-21 NOTE — Progress Notes (Signed)
Subjective He notes about a week ago BP shot up, and at the same time has felt fluid behind both ears, dizzy, feels off balance.   Feels pressure in ears . Has used some regular allegra last 2 days. Using afrin some, net pot regularly.  otherwise been feeling fine.  No fever, no NVD, no cough, chest congestion, wheezing, no chest pain, no edema.  No tinnitus although he notes hx/o tinnitus and prior surgery for meniere's' years ago.  No other aggravating or relieving factors. No other complaint.  Past Medical History  Diagnosis Date  . Dyslipidemia   . Arthritis   . Statin intolerance     intolerance with Lipitor  . Hypertension   . Inguinal hernia 2012    right  . Fatty liver   . Abdominal pain     with coffee  . Diverticulosis   . H/O echocardiogram 10/18/2011    transthoracic - mild concentric LVH, EF 55%, mild valvular aortic stenosis, LA mild to moderate dilation, otherwise normal echo; Dr. Rollene Fare  . H/O diagnostic ultrasound 03/06/11    Aortic ultrasound - no AAA identified  . Abdominal ultrasound, abnormal 12/09/09    fatty liver disease  . Heart murmur, aortic     cardiac eval 09/2011, Dr. Gwenlyn Found [facility]), mild aortic stenosis  . Allergy     allergy shots, cat allergy; Dr. Mosetta Anis;  . Testicular pain 01/08/10    Urology eval, Dr. Alinda Money  . Normal cardiac stress test 03/02/07    Myoview, EF 65%; Dr. Golden Hurter  . H/O CT scan of chest 03/20/12    normal  . H/O CT scan of abdomen 03/20/12    abdomen/pelvis - normal  . Abnormal carotid ultrasound 03/01/07    R no ICA stenosis. Left 40-60% ICA stenosis, no significant plaques     Objective: BP 120/80 mmHg  Pulse 66  Resp 15  Wt 206 lb (93.441 kg)  General appearance: alert, no distress, WD/WN HEENT: normocephalic, sclerae anicteric, TMs pearly, nares patent, no discharge or erythema, pharynx normal Oral cavity: MMM, no lesions Neck: supple, no lymphadenopathy, no thyromegaly, no masses NVBTY:6/0 systolic murmur heard  best in upper sternal borders, no radiation, otherwise RRR, normal S1, S2 Lungs: CTA bilaterally, no wheezes, rhonchi, or rales Pulses: 2+ symmetric, upper and lower extremities, normal cap refill Neuro: nonfocal exam    Assessment: Encounter Diagnoses  Name Primary?  . Essential hypertension Yes  . Transient elevated blood pressure   . Dizziness and giddiness   . Eustachian salpingitis, acute, unspecified laterality     Plan: HTN - controlled, discussed recent fluctuations in BP that sound benign, transient  Dizziness, eustachian tube salpingitis - begin Meclizine BID, he will use his Nasanex at home, c/t neti pot, good hydration, cautioned on safe use and risks of Afrin.  F/u prn

## 2015-05-16 ENCOUNTER — Encounter (HOSPITAL_COMMUNITY): Payer: Self-pay | Admitting: *Deleted

## 2015-05-16 ENCOUNTER — Emergency Department (INDEPENDENT_AMBULATORY_CARE_PROVIDER_SITE_OTHER)
Admission: EM | Admit: 2015-05-16 | Discharge: 2015-05-16 | Disposition: A | Discharge status: Home / Self Care | Payer: Medicare Other | Source: Home / Self Care | Attending: Family Medicine | Admitting: Family Medicine

## 2015-05-16 DIAGNOSIS — T63891A Toxic effect of contact with other venomous animals, accidental (unintentional), initial encounter: Secondary | ICD-10-CM

## 2015-05-16 DIAGNOSIS — T63481A Toxic effect of venom of other arthropod, accidental (unintentional), initial encounter: Secondary | ICD-10-CM

## 2015-05-16 MED ORDER — TRIAMCINOLONE ACETONIDE 40 MG/ML IJ SUSP
40.0000 mg | Freq: Once | INTRAMUSCULAR | Status: AC
  Administered 2015-05-16: 40 mg via INTRAMUSCULAR

## 2015-05-16 MED ORDER — TRIAMCINOLONE ACETONIDE 40 MG/ML IJ SUSP
INTRAMUSCULAR | Status: AC
  Filled 2015-05-16: qty 1

## 2015-05-16 MED ORDER — METHYLPREDNISOLONE ACETATE 40 MG/ML IJ SUSP
80.0000 mg | Freq: Once | INTRAMUSCULAR | Status: AC
  Administered 2015-05-16: 80 mg via INTRAMUSCULAR

## 2015-05-16 MED ORDER — FLUTICASONE PROPIONATE 0.05 % EX CREA: TOPICAL_CREAM | Freq: Two times a day (BID) | CUTANEOUS | Status: DC

## 2015-05-16 MED ORDER — METHYLPREDNISOLONE ACETATE 80 MG/ML IJ SUSP
INTRAMUSCULAR | Status: AC
  Filled 2015-05-16: qty 1

## 2015-05-16 NOTE — ED Notes (Signed)
Pt  Reports    He   Was      Stung  By  A   Wasp      l  Arm  And  l   Leg         Some  Redness   And local  Swelling  Present

## 2015-05-16 NOTE — Discharge Instructions (Signed)
Use benadryl and cream as prescribed as needed, see your doctor if further problems.

## 2015-05-16 NOTE — ED Provider Notes (Signed)
CSN: [id number]     Arrival date & time 05/16/15  1914 History   First MD Initiated Contact with Patient 05/16/15 1932     Chief Complaint  Patient presents with  . Insect Bite   (Consider location/radiation/quality/duration/timing/severity/associated sxs/prior Treatment) Patient is a 69 y.o. male presenting with rash. The history is provided by the patient.  Rash Location:  Shoulder/arm and leg Shoulder/arm rash location:  L forearm Leg rash location:  L knee Quality: itchiness, painful, redness and swelling   Pain details:    Severity:  Mild   Duration:  6 hours Severity:  Moderate Onset quality:  Gradual Progression:  Unchanged Chronicity:  New Context: insect bite/sting   Context comment:  Yellow jacket stings this am. Ineffective treatments:  Antihistamines Associated symptoms: no shortness of breath, no throat swelling, no tongue swelling and not wheezing     Past Medical History  Diagnosis Date  . Dyslipidemia   . Arthritis   . Statin intolerance     intolerance with Lipitor  . Hypertension   . Inguinal hernia 2012    right  . Fatty liver   . Abdominal pain     with coffee  . Diverticulosis   . H/O echocardiogram 10/18/2011    transthoracic - mild concentric LVH, EF 55%, mild valvular aortic stenosis, LA mild to moderate dilation, otherwise normal echo; Dr. Rollene Fare  . H/O diagnostic ultrasound 03/06/11    Aortic ultrasound - no AAA identified  . Abdominal ultrasound, abnormal 12/09/09    fatty liver disease  . Heart murmur, aortic     cardiac eval 09/2011, Dr. Gwenlyn Found [facility]), mild aortic stenosis  . Allergy     allergy shots, cat allergy; Dr. Mosetta Anis;  . Testicular pain 01/08/10    Urology eval, Dr. Alinda Money  . Normal cardiac stress test 03/02/07    Myoview, EF 65%; Dr. Golden Hurter  . H/O CT scan of chest 03/20/12    normal  . H/O CT scan of abdomen 03/20/12    abdomen/pelvis - normal  . Abnormal carotid ultrasound 03/01/07    R no ICA stenosis. Left  40-60% ICA stenosis, no significant plaques   Past Surgical History  Procedure Laterality Date  . Shoulder surgery  2002, 2008    bilat, RTC  . Nose surgery  2005    obstruction, polyp and deviated septum; Dr. Redmond Baseman  . Hand surgery  2004    left ganglion cyst removed   . Colonoscopy  10/21/2008    internal hemorrhoids, diverticulosis; repeat 10/2018; Dr. Scarlette Shorts, [place]   Family History  Problem Relation Age of Onset  . Cancer Mother     breast  . Dementia Mother 44  . Kidney disease Father 69    kidney failure  . Rheum arthritis Father   . Diabetes Neg Hx   . Heart disease Neg Hx   . Stroke Neg Hx   . Hypertension Neg Hx   . Hyperlipidemia Neg Hx    History  Substance Use Topics  . Smoking status: Never Smoker   . Smokeless tobacco: Never Used  . Alcohol Use: 1.2 oz/week    2 Cans of beer per week     Comment: beer once or twice per week    Review of Systems  Constitutional: Negative.   Respiratory: Negative for shortness of breath and wheezing.   Skin: Positive for rash.    Allergies  Codeine; Lipitor; and Sulfa antibiotics  Home Medications   Prior to Admission medications  Medication Sig Start Date End Date Taking? Authorizing Provider  aspirin 81 MG tablet Take 81 mg by mouth daily.      Historical Provider, MD  ezetimibe (ZETIA) 10 MG tablet Take 1 tablet (10 mg total) by mouth daily. 07/30/14   Denita Lung, MD  fish oil-omega-3 fatty acids 1000 MG capsule Take 1 g by mouth daily.      Historical Provider, MD  fluticasone (CUTIVATE) 0.05 % cream Apply topically 2 (two) times daily. 05/16/15   Billy Fischer, MD  meclizine (ANTIVERT) 25 MG tablet Take 1 tablet (25 mg total) by mouth 2 (two) times daily. 04/21/15   Camelia Eng Tysinger, PA-C  metoprolol succinate (TOPROL-XL) 50 MG 24 hr tablet TAKE 1 TABLET BY MOUTH EVERY DAY 07/30/14   Denita Lung, MD  Multiple Vitamin (MULTIVITAMIN) tablet Take 1 tablet by mouth daily.      Historical Provider, MD   simvastatin (ZOCOR) 10 MG tablet TAKE 1 TABLET BY MOUTH EVERY OTHER DAY 07/30/14   Denita Lung, MD   BP 140/86 mmHg  Pulse 62  Temp(Src) 98.3 F (36.8 C) (Oral)  Resp 12  SpO2 97% Physical Exam  Constitutional: He is oriented to person, place, and time. He appears well-developed and well-nourished. No distress.  HENT:  Mouth/Throat: Oropharynx is clear and moist.  Cardiovascular: Normal heart sounds.   Pulmonary/Chest: Breath sounds normal.  Neurological: He is alert and oriented to person, place, and time.  Skin: Skin is warm and dry. Rash noted.  Local sts behind left knee and to left forearm and dorsum of hand from sting sites.  Nursing note and vitals reviewed.   ED Course  Procedures (including critical care time) Labs Review Labs Reviewed - No data to display  Imaging Review No results found.   MDM   1. Insect stings, accidental or unintentional, initial encounter        Billy Fischer, MD 05/16/15 2003

## 2015-07-28 ENCOUNTER — Other Ambulatory Visit: Payer: Self-pay | Admitting: Family Medicine

## 2015-09-30 ENCOUNTER — Other Ambulatory Visit: Payer: Self-pay

## 2015-09-30 ENCOUNTER — Telehealth: Payer: Self-pay

## 2015-09-30 MED ORDER — CARISOPRODOL 350 MG PO TABS: 350.0000 mg | ORAL_TABLET | Freq: Four times a day (QID) | ORAL | Status: DC | PRN

## 2015-09-30 NOTE — Telephone Encounter (Signed)
Check on this and let me know

## 2015-09-30 NOTE — Telephone Encounter (Addendum)
Pt needs more muscle relaxers for his back spasms, carisoprodol 350mg . Call into [facility] on [address] (in [place])

## 2015-09-30 NOTE — Telephone Encounter (Signed)
Call in soma 350 mg, #20 one daily when necessary spasm

## 2015-09-30 NOTE — Telephone Encounter (Signed)
Called in soma per jcl 

## 2015-09-30 NOTE — Telephone Encounter (Signed)
Called in.

## 2015-09-30 NOTE — Telephone Encounter (Signed)
You last soma in 2013

## 2015-10-26 ENCOUNTER — Other Ambulatory Visit: Payer: Self-pay | Admitting: Family Medicine

## 2015-11-19 ENCOUNTER — Ambulatory Visit (INDEPENDENT_AMBULATORY_CARE_PROVIDER_SITE_OTHER): Payer: Medicare Other | Admitting: Family Medicine

## 2015-11-19 ENCOUNTER — Encounter: Payer: Self-pay | Admitting: Family Medicine

## 2015-11-19 VITALS — BP 130/90 | HR 62 | Resp 12 | Wt 205.0 lb

## 2015-11-19 DIAGNOSIS — Z23 Encounter for immunization: Secondary | ICD-10-CM | POA: Diagnosis not present

## 2015-11-19 DIAGNOSIS — E785 Hyperlipidemia, unspecified: Secondary | ICD-10-CM | POA: Diagnosis not present

## 2015-11-19 DIAGNOSIS — Z79899 Other long term (current) drug therapy: Secondary | ICD-10-CM | POA: Diagnosis not present

## 2015-11-19 DIAGNOSIS — Z1159 Encounter for screening for other viral diseases: Secondary | ICD-10-CM

## 2015-11-19 DIAGNOSIS — M533 Sacrococcygeal disorders, not elsewhere classified: Secondary | ICD-10-CM

## 2015-11-19 DIAGNOSIS — I10 Essential (primary) hypertension: Secondary | ICD-10-CM

## 2015-11-19 DIAGNOSIS — Z125 Encounter for screening for malignant neoplasm of prostate: Secondary | ICD-10-CM

## 2015-11-19 LAB — COMPREHENSIVE METABOLIC PANEL
ALBUMIN: 4.5 g/dL (ref 3.6–5.1)
ALT: 40 U/L (ref 9–46)
AST: 33 U/L (ref 10–35)
Alkaline Phosphatase: 50 U/L (ref 40–115)
BILIRUBIN TOTAL: 0.7 mg/dL (ref 0.2–1.2)
BUN: 21 mg/dL (ref 7–25)
CALCIUM: 9.7 mg/dL (ref 8.6–10.3)
CHLORIDE: 102 mmol/L (ref 98–110)
CO2: 26 mmol/L (ref 20–31)
CREATININE: 1.22 mg/dL (ref 0.70–1.25)
Glucose, Bld: 88 mg/dL (ref 65–99)
POTASSIUM: 4.2 mmol/L (ref 3.5–5.3)
Sodium: 139 mmol/L (ref 135–146)
TOTAL PROTEIN: 7.3 g/dL (ref 6.1–8.1)

## 2015-11-19 LAB — LIPID PANEL
Cholesterol: 183 mg/dL (ref 125–200)
HDL: 53 mg/dL (ref >=40)
LDL CALC: 112 mg/dL (ref <130)
TRIGLYCERIDES: 92 mg/dL (ref <150)
Total CHOL/HDL Ratio: 3.5 Ratio (ref <=5.0)
VLDL: 18 mg/dL (ref <30)

## 2015-11-19 LAB — CBC WITH DIFFERENTIAL/PLATELET
Basophils Absolute: 0.1 10*3/uL (ref 0.0–0.1)
Basophils Relative: 1 % (ref 0–1)
EOS ABS: 0.1 10*3/uL (ref 0.0–0.7)
Eosinophils Relative: 1 % (ref 0–5)
HEMATOCRIT: 49 % (ref 39.0–52.0)
Hemoglobin: 16.5 g/dL (ref 13.0–17.0)
LYMPHS ABS: 2 10*3/uL (ref 0.7–4.0)
Lymphocytes Relative: 35 % (ref 12–46)
MCH: 31.3 pg (ref 26.0–34.0)
MCHC: 33.7 g/dL (ref 30.0–36.0)
MCV: 93 fL (ref 78.0–100.0)
MONOS PCT: 8 % (ref 3–12)
MPV: 10.4 fL (ref 8.6–12.4)
Monocytes Absolute: 0.4 10*3/uL (ref 0.1–1.0)
NEUTROS PCT: 55 % (ref 43–77)
Neutro Abs: 3.1 10*3/uL (ref 1.7–7.7)
PLATELETS: 136 10*3/uL — AB (ref 150–400)
RBC: 5.27 MIL/uL (ref 4.22–5.81)
RDW: 13.5 % (ref 11.5–15.5)
WBC: 5.6 10*3/uL (ref 4.0–10.5)

## 2015-11-19 MED ORDER — METOPROLOL SUCCINATE ER 50 MG PO TB24: 50.0000 mg | ORAL_TABLET | Freq: Every day | ORAL | Status: DC

## 2015-11-19 MED ORDER — EZETIMIBE 10 MG PO TABS: 10.0000 mg | ORAL_TABLET | Freq: Every day | ORAL | Status: DC

## 2015-11-19 MED ORDER — SIMVASTATIN 10 MG PO TABS: 10.0000 mg | ORAL_TABLET | ORAL | Status: DC

## 2015-11-19 NOTE — Progress Notes (Signed)
   Subjective:    Patient ID: Daniel [name], male    DOB: [birth year], 69 y.o.   MRN: [id number]  HPI He is here for an interval evaluation. He has a long history of difficulty with low back pain. Apparently he was evaluated several decades ago. He states that over the last 2 weeks he has noted some pain in the coccygeal area especially with movement. He has had no difficulty with bowel habits and has had a colonoscopy in 2014. His bowel habits are normal. No urinary symptoms. He also complains of intermittent arm and leg cramping. He continues on his metoprolol, Zetia and simvastatin. He is having no difficulties with them. He apparently is semiretired. Family and social history as well as health maintenance and immunizations were reviewed.   Review of Systems     Objective:   Physical Exam Alert and in no distress. Rectal exam does show a slightly tender to palpation coccyx. The masses were noted.       Assessment & Plan:  Coccydynia  Dyslipidemia - Plan: Lipid panel  Essential hypertension, benign - Plan: CBC with Differential/Platelet, Comprehensive metabolic panel  Screening for prostate cancer - Plan: PSA, Medicare  Need for prophylactic vaccination against Streptococcus pneumoniae (pneumococcus) - Plan: Pneumococcal conjugate vaccine 13-valent  Encounter for long-term (current) use of medications - Plan: CBC with Differential/Platelet, Comprehensive metabolic panel, Hepatitis C antibody, Lipid panel, PSA, Medicare  Need for hepatitis C screening test - Plan: Hepatitis C antibody  routine blood screening will be done as well as renewal of his medications. He has difficulty with heat causing erythema and therefore recommended an anti-inflammatory for the coccygeal discomfort.

## 2015-11-19 NOTE — Patient Instructions (Signed)
Try 2 Aleve twice a day for the next week to 10 days and see if that'll help take the edge off of it

## 2015-11-20 LAB — PSA, MEDICARE: PSA: 2.37 ng/mL (ref <=4.00)

## 2015-11-20 LAB — HEPATITIS C ANTIBODY: HCV Ab: NEGATIVE

## 2016-06-28 ENCOUNTER — Encounter: Payer: Self-pay | Admitting: Medical

## 2016-06-28 ENCOUNTER — Ambulatory Visit (INDEPENDENT_AMBULATORY_CARE_PROVIDER_SITE_OTHER): Payer: Medicare Other | Admitting: Medical

## 2016-06-28 VITALS — BP 128/80 | HR 60 | Wt 210.0 lb

## 2016-06-28 DIAGNOSIS — M542 Cervicalgia: Secondary | ICD-10-CM | POA: Diagnosis not present

## 2016-06-28 DIAGNOSIS — M6248 Contracture of muscle, other site: Secondary | ICD-10-CM

## 2016-06-28 DIAGNOSIS — M62838 Other muscle spasm: Secondary | ICD-10-CM

## 2016-06-28 MED ORDER — DIAZEPAM 10 MG PO TABS: ORAL_TABLET | ORAL | 0 refills | Status: DC

## 2016-06-28 NOTE — Progress Notes (Signed)
Subjective:     Patient ID: Daniel [name], male   DOB: September 27, 1946, 70 y.o.   MRN: [id number]  HPI Chief Complaint  Patient presents with  . Neck Pain    10 days. his neck feels super tight and tenses up. has most of the problem at night can not even roll over in bed.    Here for 10 day hx/o neck tightness driving him nuts.  Like a tension headache, he feels neck tightness but without the headache.   More on left than right.  Tries to stretch and relax, but can't get relief.  During the day not as bad, but worse at night, can't get relaxed.  Feels spasm or tightness in the muscles.   Has pain at night, worse trying to change positions.   Has over the door traction device, and been using this some BID to pull the neck a bit.  Has 1/2 tablet left over Soma from 09/2015, and thinks it helped a little.  No fever, no pain into arm, no numbness tingling or weakness in arms.     He notes fall in the back yard a year ago, landed on butt, and had pain for a while, saw Dr. Redmond School for this.  Pain continued for months, ended up seeing ortho, Dr. Cay Schillings, was given different muscle relaxer that didn't do anything.    Ended up having MRI for coccydynia, and vertebrae were compressed a bit, but normal for age.   No other abnormality found.   Still has pain from time to time sitting on something hard.   No other aggravating or relieving factors. No other complaint.  Past Medical History:  Diagnosis Date  . Abdominal pain    with coffee  . Abdominal ultrasound, abnormal 12/09/09   fatty liver disease  . Abnormal carotid ultrasound 03/01/07   R no ICA stenosis. 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 Allergy    allergy shots, cat allergy; Dr. Mosetta Anis;  . Arthritis   . Diverticulosis   . Dyslipidemia   . Fatty liver   . H/O CT scan of abdomen 03/20/12   abdomen/pelvis - normal  . H/O CT scan of chest 03/20/12   normal  . H/O diagnostic ultrasound 03/06/11   Aortic ultrasound - no AAA  identified  . H/O echocardiogram 10/18/2011   transthoracic - mild concentric LVH, EF 55%, mild valvular aortic stenosis, LA mild to moderate dilation, otherwise normal echo; Dr. Rollene Fare  . Heart murmur, aortic    cardiac eval 09/2011, Dr. Gwenlyn Found [facility]), mild aortic stenosis  . Hypertension   . Inguinal hernia 2012   right  . Normal cardiac stress test 03/02/07   Myoview, EF 65%; Dr. Golden Hurter  . Statin intolerance    intolerance with Lipitor  . Testicular pain 01/08/10   Urology eval, Dr. Alinda Money    Review of Systems As in subjective      Objective:   Physical Exam  Vitals:   06/28/16 0859  BP: 128/80  Pulse: 60   General appearance: alert, no distress, WD/WN, white male HEENT: normocephalic, sclerae anicteric, TMs pearly, nares patent, no discharge or erythema, pharynx normal Oral cavity: MMM, no lesions Neck: supple, no lymphadenopathy, no thyromegaly, no masses, slight decreased in left neck flexion and rotation compared to right, otherwise normal ROM, nontender, no deformity Arms with pulses: 2+ symmetric, upper and lower extremities, normal cap refill No arm edema Neuro: arms with normal strength, sensation, DTRs. Back: nontender, no  asymmetry     Assessment:     Encounter Diagnoses  Name Primary?  . Cervicalgia Yes  . Neck muscle spasm       Plan:      Discussed symptoms, exam, possible causes, but likely DDD vs disc protrusion, vs arthritic changes.   No obvious torticollis, no worry for other abnormality.  Will send for xray.   Advised NSAID OTC the next few days, continue good stretching and ROM exercise, heat, massage, and prn use of muscle relaxer as below.   [name] was seen today for neck pain.  Diagnoses and all orders for this visit:  Cervicalgia -     DG Cervical Spine Complete; Future  Neck muscle spasm -     DG Cervical Spine Complete; Future  Other orders -     diazepam (VALIUM) 10 MG tablet; 1/2- 1 tablet po prn up to BID short  term

## 2016-07-13 ENCOUNTER — Ambulatory Visit (INDEPENDENT_AMBULATORY_CARE_PROVIDER_SITE_OTHER): Payer: Medicare Other | Admitting: Family Medicine

## 2016-07-13 ENCOUNTER — Encounter: Payer: Self-pay | Admitting: Family Medicine

## 2016-07-13 VITALS — BP 130/80 | HR 56 | Ht 71.75 in | Wt 208.8 lb

## 2016-07-13 DIAGNOSIS — Z0000 Encounter for general adult medical examination without abnormal findings: Secondary | ICD-10-CM | POA: Diagnosis not present

## 2016-07-13 DIAGNOSIS — E785 Hyperlipidemia, unspecified: Secondary | ICD-10-CM

## 2016-07-13 DIAGNOSIS — M6248 Contracture of muscle, other site: Secondary | ICD-10-CM

## 2016-07-13 DIAGNOSIS — M542 Cervicalgia: Secondary | ICD-10-CM

## 2016-07-13 DIAGNOSIS — I10 Essential (primary) hypertension: Secondary | ICD-10-CM

## 2016-07-13 DIAGNOSIS — M62838 Other muscle spasm: Secondary | ICD-10-CM

## 2016-07-13 DIAGNOSIS — Z79899 Other long term (current) drug therapy: Secondary | ICD-10-CM

## 2016-07-13 MED ORDER — CARISOPRODOL 350 MG PO TABS: 350.0000 mg | ORAL_TABLET | Freq: Four times a day (QID) | ORAL | 0 refills | Status: DC | PRN

## 2016-07-13 MED ORDER — EZETIMIBE 10 MG PO TABS: 10.0000 mg | ORAL_TABLET | Freq: Every day | ORAL | 3 refills | Status: DC

## 2016-07-13 MED ORDER — SIMVASTATIN 10 MG PO TABS: 10.0000 mg | ORAL_TABLET | ORAL | 3 refills | Status: DC

## 2016-07-13 MED ORDER — METOPROLOL SUCCINATE ER 50 MG PO TB24: 50.0000 mg | ORAL_TABLET | Freq: Every day | ORAL | 3 refills | Status: DC

## 2016-07-13 NOTE — Patient Instructions (Signed)
  Daniel [name] , Thank you for taking time to come for your Medicare Wellness Visit. I appreciate your ongoing commitment to your health goals. Please review the following plan we discussed and let me know if I can assist you in the future.   These are the goals we discussed: Goals    None      This is a list of the screening recommended for you and due dates:  Health Maintenance  Topic Date Due  . Flu Shot  06/28/2016  . Pneumonia vaccines (2 of 2 - PPSV23) 03/14/2017  . Tetanus Vaccine  12/03/2019  . Colon Cancer Screening  11/12/2023  . Shingles Vaccine  Completed  .  Hepatitis C: One time screening is recommended by Center for Disease Control  (CDC) for  adults born from [birth year] through 1963.   Completed

## 2016-07-13 NOTE — Progress Notes (Signed)
Daniel [name] is a 70 y.o. male who presents for annual wellness visit and follow-up on chronic medical conditions.  He has the following concerns: He continues have difficulty with intermittent neck pain as well as low back pain. He has had difficulty getting comfortable in bed and does find sometimes sleeping on the couch does help. He also is using Aleve and start recently using cervical traction. He has been to physical therapy for this. Review of the record indicates questionable aortic valve problem however it showed sclerosis rather than stenosis. He continues on his blood pressure medications. He would like a refill on his muscle relaxer and does use this very sparingly. His allergies seem to be under good control.   Immunizations and Health Maintenance Immunization History  Administered Date(s) Administered  . Hepatitis B 05/17/1992, 12/17/1992, 06/16/2002  . Influenza Split 09/08/2014, 08/29/2015  . MMR 04/16/2012  . PPD Test 04/02/2012, 04/16/2012, 06/04/2013, 06/19/2013  . Pneumococcal Conjugate-13 11/19/2015  . Pneumococcal Polysaccharide-23 03/14/2012  . Pneumococcal-Unspecified 12/05/2001  . Td 02/03/2003  . Tdap 12/02/2009  . Zoster 09/16/2008   Health Maintenance Due  Topic Date Due  . INFLUENZA VACCINE  06/28/2016    Last colonoscopy: 11/11/13  Last PSA:11/19/15 Dentist: 3 weeks ago Ophtho: 05/12/16 Exercise:  Other doctors caring for patient include:--  Advanced Directives: Does patient have an advance directive?: No Would patient like information on creating an advanced directive?: No - patient declined information  Depression screen:  See questionnaire below.     Depression screen PHQ 2/9 07/30/2014  Decreased Interest 0  Down, Depressed, Hopeless 0  PHQ - 2 Score 0    Fall Screen: See Questionaire below.   Fall Risk  07/30/2014  Falls in the past year? No   ADL screen:  See questionnaire below.  Functional Status Survey:   Review of  Systems  Constitutional: -, -unexpected weight change, -anorexia, -fatigue Allergy: -sneezing, -itching, -congestion Dermatology: denies changing moles, rash, lumps ENT: -runny nose, -ear pain, -sore throat,  Cardiology:  -chest pain, -palpitations, -orthopnea, Respiratory: -cough, -shortness of breath, -dyspnea on exertion, -wheezing,  Gastroenterology: -abdominal pain, -nausea, -vomiting, -diarrhea, -constipation, -dysphagia Hematology: -bleeding or bruising problems Musculoskeletal: -arthralgias, -myalgias, -joint swelling, -back pain, - Ophthalmology: -vision changes,  Urology: -dysuria, -difficulty urinating,  -urinary frequency, -urgency, incontinence Neurology: -, -numbness, , -memory loss, -falls, -dizziness    PHYSICAL EXAM:  BP 130/80   Pulse (!) 56   Ht 5' 11.75" (1.822 m)   Wt 208 lb 12.8 oz (94.7 kg)   BMI 28.52 kg/m   General Appearance: Alert, cooperative, no distress, appears stated age Head: Normocephalic, without obvious abnormality, atraumatic Eyes: PERRL, conjunctiva/corneas clear, EOM's intact, fundi benign Ears: Normal TM's and external ear canals Nose: Nares normal, mucosa normal, no drainage or sinus   tenderness Throat: Lips, mucosa, and tongue normal; teeth and gums normal Neck: Supple, no lymphadenopathy, thyroid:no enlargement/tenderness/nodules; no carotid bruit or JVD Lungs: Clear to auscultation bilaterally without wheezes, rales or ronchi; respirations unlabored Heart: Regular rate and rhythm, S1 and S2 normal, no murmur, rub or gallop Abdomen: Soft, non-tender, nondistended, normoactive bowel sounds, no masses, no hepatosplenomegaly Extremities: No clubbing, cyanosis or edema Pulses: 2+ and symmetric all extremities Skin: Skin color, texture, turgor normal, no rashes or lesions Lymph nodes: Cervical, supraclavicular, and axillary nodes normal Neurologic: CNII-XII intact, normal strength, sensation and gait; reflexes 2+ and symmetric throughout    Psych: Normal mood, affect, hygiene and grooming  ASSESSMENT/PLAN: Cervicalgia  Neck muscle spasm  Essential  hypertension, benign - Plan: metoprolol succinate (TOPROL-XL) 50 MG 24 hr tablet  Dyslipidemia - Plan: simvastatin (ZOCOR) 10 MG tablet, ezetimibe (ZETIA) 10 MG tablet  Encounter for long-term (current) use of medications Encouraged him to continue with his physical therapy regimen. Also may possibly refer him to orthopedics if continued difficulty otherwise he will continue on his present medication regimen.    Discussed PSA screening (risks/benefits), recommended at least 30 minutes of aerobic activity at least 5 days/week; proper sunscreen use reviewed; healthy diet and alcohol recommendations (less than or equal to 2 drinks/day) reviewed; regular seatbelt use; changing batteries in smoke detectors. Immunization recommendations discussed.  Colonoscopy recommendations reviewed.   Medicare Attestation I have personally reviewed: The patient's medical and social history Their use of alcohol, tobacco or illicit drugs Their current medications and supplements The patient's functional ability including ADLs,fall risks, home safety risks, cognitive, and hearing and visual impairment Diet and physical activities Evidence for depression or mood disorders  The patient's weight, height, and BMI have been recorded in the chart.  I have made referrals, counseling, and provided education to the patient based on review of the above and I have provided the patient with a written personalized care plan for preventive services.     Daniel Haste, MD   07/13/2016

## 2016-08-04 ENCOUNTER — Other Ambulatory Visit: Payer: Self-pay

## 2016-08-04 DIAGNOSIS — M62838 Other muscle spasm: Secondary | ICD-10-CM

## 2016-08-04 DIAGNOSIS — M542 Cervicalgia: Secondary | ICD-10-CM

## 2016-08-05 ENCOUNTER — Other Ambulatory Visit: Payer: Self-pay | Admitting: *Deleted

## 2016-08-05 ENCOUNTER — Telehealth: Payer: Self-pay | Admitting: Family Medicine

## 2016-08-05 ENCOUNTER — Encounter: Payer: Medicare Other | Admitting: Medical

## 2016-08-05 ENCOUNTER — Ambulatory Visit
Admission: RE | Admit: 2016-08-05 | Discharge: 2016-08-05 | Disposition: A | Discharge status: Home / Self Care | Payer: Medicare Other | Source: Ambulatory Visit | Attending: Medical | Admitting: Medical

## 2016-08-05 DIAGNOSIS — M542 Cervicalgia: Secondary | ICD-10-CM

## 2016-08-05 DIAGNOSIS — I25119 Atherosclerotic heart disease of native coronary artery with unspecified angina pectoris: Secondary | ICD-10-CM

## 2016-08-05 DIAGNOSIS — M62838 Other muscle spasm: Secondary | ICD-10-CM

## 2016-08-05 NOTE — Telephone Encounter (Signed)
Let him know that it does show some cervical disc disease as well as calcium deposit in his arteries. Set him up for carotid Dopplers as the next step

## 2016-08-05 NOTE — Telephone Encounter (Signed)
Pt called and was wanting to know about his x-ray results he went yesterday and said no one has called him with the results, I informed him that shane was out of the office today, he said if someone could please call him and let him know he can be reached at [phone]

## 2016-08-05 NOTE — Telephone Encounter (Signed)
Done and scheduled patient for next week.

## 2016-08-11 ENCOUNTER — Inpatient Hospital Stay: Admission: RE | Admit: 2016-08-11 | Payer: Medicare Other | Source: Ambulatory Visit

## 2016-08-15 ENCOUNTER — Ambulatory Visit
Admission: RE | Admit: 2016-08-15 | Discharge: 2016-08-15 | Disposition: A | Discharge status: Home / Self Care | Payer: Medicare Other | Source: Ambulatory Visit | Attending: Family Medicine | Admitting: Family Medicine

## 2016-08-15 DIAGNOSIS — I25119 Atherosclerotic heart disease of native coronary artery with unspecified angina pectoris: Secondary | ICD-10-CM

## 2016-08-17 ENCOUNTER — Other Ambulatory Visit: Payer: Self-pay | Admitting: Family Medicine

## 2016-08-17 ENCOUNTER — Telehealth: Payer: Self-pay | Admitting: Medical

## 2016-08-17 DIAGNOSIS — E041 Nontoxic single thyroid nodule: Secondary | ICD-10-CM

## 2016-08-17 NOTE — Telephone Encounter (Signed)
See [place] cartoid results

## 2016-08-17 NOTE — Telephone Encounter (Signed)
Pt called and states he had a ultrasound on Monday and was wanting the results,states no one has called him with the results  pt can be reached at [phone]

## 2016-08-17 NOTE — Telephone Encounter (Signed)
See results message 

## 2016-08-22 ENCOUNTER — Ambulatory Visit
Admission: RE | Admit: 2016-08-22 | Discharge: 2016-08-22 | Disposition: A | Discharge status: Home / Self Care | Payer: Medicare Other | Source: Ambulatory Visit | Attending: Medical | Admitting: Medical

## 2016-08-22 DIAGNOSIS — E041 Nontoxic single thyroid nodule: Secondary | ICD-10-CM

## 2016-08-23 ENCOUNTER — Encounter: Payer: Self-pay | Admitting: Medical

## 2016-08-23 ENCOUNTER — Telehealth: Payer: Self-pay

## 2016-08-23 ENCOUNTER — Ambulatory Visit (INDEPENDENT_AMBULATORY_CARE_PROVIDER_SITE_OTHER): Payer: Medicare Other | Admitting: Medical

## 2016-08-23 VITALS — BP 118/72 | HR 55 | Temp 97.7°F | Ht 71.5 in | Wt 205.6 lb

## 2016-08-23 DIAGNOSIS — R748 Abnormal levels of other serum enzymes: Secondary | ICD-10-CM

## 2016-08-23 DIAGNOSIS — E042 Nontoxic multinodular goiter: Secondary | ICD-10-CM | POA: Diagnosis not present

## 2016-08-23 DIAGNOSIS — I6523 Occlusion and stenosis of bilateral carotid arteries: Secondary | ICD-10-CM

## 2016-08-23 DIAGNOSIS — I10 Essential (primary) hypertension: Secondary | ICD-10-CM

## 2016-08-23 DIAGNOSIS — M6248 Contracture of muscle, other site: Secondary | ICD-10-CM | POA: Diagnosis not present

## 2016-08-23 DIAGNOSIS — I350 Nonrheumatic aortic (valve) stenosis: Secondary | ICD-10-CM | POA: Diagnosis not present

## 2016-08-23 DIAGNOSIS — E785 Hyperlipidemia, unspecified: Secondary | ICD-10-CM

## 2016-08-23 DIAGNOSIS — Z0000 Encounter for general adult medical examination without abnormal findings: Secondary | ICD-10-CM | POA: Insufficient documentation

## 2016-08-23 DIAGNOSIS — M5030 Other cervical disc degeneration, unspecified cervical region: Secondary | ICD-10-CM | POA: Diagnosis not present

## 2016-08-23 DIAGNOSIS — I6529 Occlusion and stenosis of unspecified carotid artery: Secondary | ICD-10-CM | POA: Insufficient documentation

## 2016-08-23 DIAGNOSIS — M62838 Other muscle spasm: Secondary | ICD-10-CM | POA: Insufficient documentation

## 2016-08-23 DIAGNOSIS — Z125 Encounter for screening for malignant neoplasm of prostate: Secondary | ICD-10-CM | POA: Diagnosis not present

## 2016-08-23 DIAGNOSIS — M542 Cervicalgia: Secondary | ICD-10-CM

## 2016-08-23 NOTE — Progress Notes (Addendum)
Subjective: Chief Complaint  Patient presents with  . Results    discussion of thyroid ultrasound and thyroid labs   Here for f/u on recent studies.   We originally sent him for neck xray given neck pains, but atherosclerosis was seen which prompted carotid ultrasounds.  Then a thyroid nodule was seen which prompted thyroid ultrasound.    Over all his neck symptoms have improved a bit.     He reports back when he was a child, his mom notes that he had radiation therapy to shrink down his thyroid.  Back then doctors were doing this.   He read that simvastatin in women has been show to possible put him at risk for thyroid cancer, worried about this.   No other aggravating or relieving factors. No other complaint.  Past Medical History:  Diagnosis Date  . Abdominal pain    with coffee  . Abdominal ultrasound, abnormal 12/09/09   fatty liver disease  . Abnormal carotid ultrasound 03/01/07   R no ICA stenosis. 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 Allergy    allergy shots, cat allergy; Dr. Mosetta Anis;  . Arthritis   . Diverticulosis   . Dyslipidemia   . Fatty liver   . H/O CT scan of abdomen 03/20/12   abdomen/pelvis - normal  . H/O CT scan of chest 03/20/12   normal  . H/O diagnostic ultrasound 03/06/11   Aortic ultrasound - no AAA identified  . H/O echocardiogram 10/18/2011   transthoracic - mild concentric LVH, EF 55%, mild valvular aortic stenosis, LA mild to moderate dilation, otherwise normal echo; Dr. Rollene Fare  . Heart murmur, aortic    cardiac eval 09/2011, Dr. Gwenlyn Found [facility]), mild aortic stenosis  . Hypertension   . Inguinal hernia 2012   right  . Normal cardiac stress test 03/02/07   Myoview, EF 65%; Dr. Golden Hurter  . Statin intolerance    intolerance with Lipitor  . Testicular pain 01/08/10   Urology eval, Dr. Alinda Money   Current Outpatient Prescriptions on File Prior to Visit  Medication Sig Dispense Refill  . aspirin 81 MG tablet Take 81 mg by mouth  daily.      Marland Kitchen ezetimibe (ZETIA) 10 MG tablet Take 1 tablet (10 mg total) by mouth daily. 90 tablet 3  . fish oil-omega-3 fatty acids 1000 MG capsule Take 1 g by mouth daily.      . metoprolol succinate (TOPROL-XL) 50 MG 24 hr tablet Take 1 tablet (50 mg total) by mouth daily. Take with or immediately following a meal. 90 tablet 3  . Multiple Vitamin (MULTIVITAMIN) tablet Take 1 tablet by mouth daily.      . simvastatin (ZOCOR) 10 MG tablet Take 1 tablet (10 mg total) by mouth every other day. 90 tablet 3  . carisoprodol (SOMA) 350 MG tablet Take 1 tablet (350 mg total) by mouth 4 (four) times daily as needed for muscle spasms. (Patient not taking: Reported on 08/23/2016) 20 tablet 0  . diazepam (VALIUM) 10 MG tablet 1/2- 1 tablet po prn up to BID short term (Patient not taking: Reported on 08/23/2016) 15 tablet 0   No current facility-administered medications on file prior to visit.    Past Surgical History:  Procedure Laterality Date  . COLONOSCOPY  10/21/2008   internal hemorrhoids, diverticulosis; repeat 10/2018; Dr. Scarlette Shorts, [place]  . HAND SURGERY  2004   left ganglion cyst removed   . NOSE SURGERY  2005   obstruction, polyp and  deviated septum; Dr. Redmond Baseman  . SHOULDER SURGERY  2002, 2008   bilat, RTC   ROS as in subjective    Objective: BP 118/72 (BP Location: Right Arm, Patient Position: Sitting, Cuff Size: Large)   Pulse (!) 55   Temp 97.7 F (36.5 C) (Oral)   Ht 5' 11.5" (1.816 m)   Wt 205 lb 9.6 oz (93.3 kg)   SpO2 98%   BMI 28.28 kg/m   Gen: wd, wn, nad Neck: nontender, normal ROM, no deformity Back nontender, normal ROM Arms nontender, no deformity, normal ROM Arms neurovascularly intact Lungs clear Heart: RRR, 2/6 blowing systolic murmur heard best in upper sternal borders, otherwise normal s1, s2 Ext: no edema   Assessment:  Encounter Diagnoses  Name Primary?  . Carotid atherosclerosis, bilateral Yes  . Aortic stenosis   . Essential hypertension,  benign   . Dyslipidemia   . Multinodular thyroid   . Cervicalgia   . Neck muscle spasm   . DDD (degenerative disc disease), cervical   . Elevated CK   . Screening for prostate cancer      Plan: Spent 30+ minutes discussing recent imaging, results, recommendations  Carotid atherosclerosis, aortic stenosis, dyslipidemia, hypertension - discussed the recent carotid ultrasound, neck xray, 2014 echo.   I recommended cardiac health exercise, goal pulse of 120 with aerobic exercise x 30 minutes 4-5 days per week.  Advised he c/t aspirin, statin, but repeat lab in December.  Suggested LDL <70 goal.   Consider recheck with cardiology within the next year.  Reviewed 2014 cardiology notes  Thyroid  - reviewed ultrasound.  Plan thyroid labs in December upon recheck, plan repeat US in 1 year  DDD, neck pain - improving for now.    Consider ortho referral if worsening.    F/u December 2017 for lab visit  Spent > 45 minutes face to face with patient in discussion of symptoms, evaluation, plan and recommendations.    Riggs was seen today for results.  Diagnoses and all orders for this visit:  Carotid atherosclerosis, bilateral  Aortic stenosis  Essential hypertension, benign  Dyslipidemia -     CK; Future -     Lipid panel; Future -     Hepatic function panel; Future  Multinodular thyroid  Cervicalgia  Neck muscle spasm  DDD (degenerative disc disease), cervical  Elevated CK -     CK; Future  Screening for prostate cancer -     PSA; Future

## 2016-08-23 NOTE — Telephone Encounter (Signed)
At check out- pt questions if you can check PSA in Dec when he comes for labs

## 2016-11-18 ENCOUNTER — Other Ambulatory Visit: Payer: Medicare Other

## 2016-11-18 ENCOUNTER — Other Ambulatory Visit: Payer: Self-pay | Admitting: Medical

## 2016-11-18 DIAGNOSIS — R748 Abnormal levels of other serum enzymes: Secondary | ICD-10-CM

## 2016-11-18 DIAGNOSIS — Z125 Encounter for screening for malignant neoplasm of prostate: Secondary | ICD-10-CM

## 2016-11-18 DIAGNOSIS — E785 Hyperlipidemia, unspecified: Secondary | ICD-10-CM

## 2016-11-18 LAB — PSA: PSA: 1.8 ng/mL (ref <=4.0)

## 2016-11-19 LAB — HEPATIC FUNCTION PANEL
ALBUMIN: 4.1 g/dL (ref 3.6–5.1)
ALK PHOS: 48 U/L (ref 40–115)
ALT: 28 U/L (ref 9–46)
AST: 27 U/L (ref 10–35)
BILIRUBIN INDIRECT: 0.5 mg/dL (ref 0.2–1.2)
Bilirubin, Direct: 0.1 mg/dL (ref <=0.2)
TOTAL PROTEIN: 6.7 g/dL (ref 6.1–8.1)
Total Bilirubin: 0.6 mg/dL (ref 0.2–1.2)

## 2016-11-19 LAB — LIPID PANEL
CHOLESTEROL: 164 mg/dL (ref <200)
HDL: 40 mg/dL — AB (ref >40)
LDL Cholesterol: 107 mg/dL — ABNORMAL HIGH (ref <100)
TRIGLYCERIDES: 85 mg/dL (ref <150)
Total CHOL/HDL Ratio: 4.1 Ratio (ref <5.0)
VLDL: 17 mg/dL (ref <30)

## 2016-11-19 LAB — CK: CK TOTAL: 476 U/L — AB (ref 7–232)

## 2016-11-23 ENCOUNTER — Other Ambulatory Visit: Payer: Self-pay | Admitting: Medical

## 2016-11-23 MED ORDER — ASPIRIN 81 MG PO TABS: 81.0000 mg | ORAL_TABLET | Freq: Every day | ORAL | 3 refills | Status: DC

## 2016-11-23 MED ORDER — ROSUVASTATIN CALCIUM 40 MG PO TABS: 40.0000 mg | ORAL_TABLET | Freq: Every day | ORAL | 1 refills | Status: DC

## 2016-11-24 LAB — TSH: TSH: 1.78 m[IU]/L (ref 0.40–4.50)

## 2017-01-24 ENCOUNTER — Other Ambulatory Visit: Payer: Self-pay | Admitting: Orthopedic Surgery

## 2017-01-24 DIAGNOSIS — G8929 Other chronic pain: Secondary | ICD-10-CM

## 2017-01-24 DIAGNOSIS — M5442 Lumbago with sciatica, left side: Principal | ICD-10-CM

## 2017-01-24 DIAGNOSIS — M5441 Lumbago with sciatica, right side: Principal | ICD-10-CM

## 2017-01-26 ENCOUNTER — Ambulatory Visit (INDEPENDENT_AMBULATORY_CARE_PROVIDER_SITE_OTHER): Payer: Medicare Other | Admitting: Medical

## 2017-01-26 ENCOUNTER — Other Ambulatory Visit: Payer: Self-pay | Admitting: Medical

## 2017-01-26 ENCOUNTER — Encounter: Payer: Self-pay | Admitting: Medical

## 2017-01-26 VITALS — BP 110/78 | HR 54 | Wt 206.2 lb

## 2017-01-26 DIAGNOSIS — I6529 Occlusion and stenosis of unspecified carotid artery: Secondary | ICD-10-CM

## 2017-01-26 DIAGNOSIS — Z1151 Encounter for screening for human papillomavirus (HPV): Secondary | ICD-10-CM | POA: Diagnosis not present

## 2017-01-26 DIAGNOSIS — I350 Nonrheumatic aortic (valve) stenosis: Secondary | ICD-10-CM

## 2017-01-26 DIAGNOSIS — E042 Nontoxic multinodular goiter: Secondary | ICD-10-CM

## 2017-01-26 DIAGNOSIS — E785 Hyperlipidemia, unspecified: Secondary | ICD-10-CM

## 2017-01-26 DIAGNOSIS — I6523 Occlusion and stenosis of bilateral carotid arteries: Secondary | ICD-10-CM | POA: Diagnosis not present

## 2017-01-26 DIAGNOSIS — R7989 Other specified abnormal findings of blood chemistry: Secondary | ICD-10-CM

## 2017-01-26 DIAGNOSIS — I10 Essential (primary) hypertension: Secondary | ICD-10-CM

## 2017-01-26 DIAGNOSIS — R945 Abnormal results of liver function studies: Secondary | ICD-10-CM

## 2017-01-26 DIAGNOSIS — R748 Abnormal levels of other serum enzymes: Secondary | ICD-10-CM | POA: Diagnosis not present

## 2017-01-26 LAB — COMPREHENSIVE METABOLIC PANEL
ALK PHOS: 42 U/L (ref 40–115)
ALT: 53 U/L — AB (ref 9–46)
AST: 47 U/L — AB (ref 10–35)
Albumin: 4.3 g/dL (ref 3.6–5.1)
BUN: 21 mg/dL (ref 7–25)
CO2: 30 mmol/L (ref 20–31)
Calcium: 9.5 mg/dL (ref 8.6–10.3)
Chloride: 103 mmol/L (ref 98–110)
Creat: 1.31 mg/dL — ABNORMAL HIGH (ref 0.70–1.25)
GLUCOSE: 97 mg/dL (ref 65–99)
POTASSIUM: 4.4 mmol/L (ref 3.5–5.3)
SODIUM: 141 mmol/L (ref 135–146)
Total Bilirubin: 1 mg/dL (ref 0.2–1.2)
Total Protein: 7 g/dL (ref 6.1–8.1)

## 2017-01-26 LAB — LIPID PANEL
Cholesterol: 116 mg/dL (ref <200)
HDL: 50 mg/dL (ref >40)
LDL CALC: 53 mg/dL (ref <100)
Total CHOL/HDL Ratio: 2.3 Ratio (ref <5.0)
Triglycerides: 63 mg/dL (ref <150)
VLDL: 13 mg/dL (ref <30)

## 2017-01-26 LAB — CK: Total CK: 816 U/L — ABNORMAL HIGH (ref 7–232)

## 2017-01-26 NOTE — Progress Notes (Signed)
Subjective: Chief Complaint  Patient presents with  . follow up from new meds    follow up from new meds, he said  he thinks it working    Here to f/u on issues from late fall visit.  He had to put one of his dogs down this past week as his dog got in a fight with another dog.  Been having some back problems of late, having MRI Sunday, saw Dr. Merleen Milliner recently/ortho.   Been having some burning stinging pains in foot, right foot.  This last 30-40 minutes.  Also getting in left foot.   Can be either foot.   Hot burning pain.     He has hx/o aortic stenosis, carotid atherosclerosis with < 50% blockage on prior carotid US 07/2016, multinodular thyroid, hx/o elevated CK, mixed dyslipidemia, hypertension.   Has hx/o radiation treatment as a child.    Wife notes she was concerned about throat cancer related to HPV, saw tv commercial about this.   Wife has hx/o HPV, warts on upper gluteal cleft.  Is concerned about screening for HPV.    Compliant with Aspirin, last visit we changed to Crestor as he wasn't at goal, LDL <70.   He currently is taking Zetia 10mg  daily, fish oil OTC, and Crestor 40mg  started last visit 10/2016.    HTN - compliant with Toprol XL 50mg  daily.  Denies SOB, chest pain, edema, palpations.  Last [place] of thyroid was 07/2016.   Past Medical History:  Diagnosis Date  . Abdominal pain    with coffee  . Abdominal ultrasound, abnormal 12/09/09   fatty liver disease  . Abnormal carotid ultrasound 03/01/07   R no ICA stenosis. 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 Allergy    allergy shots, cat allergy; Dr. Mosetta Anis;  . Arthritis   . Diverticulosis   . Dyslipidemia   . Fatty liver   . H/O CT scan of abdomen 03/20/12   abdomen/pelvis - normal  . H/O CT scan of chest 03/20/12   normal  . H/O diagnostic ultrasound 03/06/11   Aortic ultrasound - no AAA identified  . H/O echocardiogram 10/18/2011   transthoracic - mild concentric LVH, EF 55%, mild valvular aortic  stenosis, LA mild to moderate dilation, otherwise normal echo; Dr. Rollene Fare  . Heart murmur, aortic    cardiac eval 09/2011, Dr. Gwenlyn [facility]), mild aortic stenosis  . Hypertension   . Inguinal hernia 2012   right  . Normal cardiac stress test 03/02/07   Myoview, EF 65%; Dr. Golden Hurter  . Statin intolerance    intolerance with Lipitor  . Testicular pain 01/08/10   Urology eval, Dr. Alinda Money   Current Outpatient Prescriptions on File Prior to Visit  Medication Sig Dispense Refill  . aspirin 81 MG tablet Take 1 tablet (81 mg total) by mouth daily. 90 tablet 3  . ezetimibe (ZETIA) 10 MG tablet Take 1 tablet (10 mg total) by mouth daily. 90 tablet 3  . fish oil-omega-3 fatty acids 1000 MG capsule Take 1 g by mouth daily.      . metoprolol succinate (TOPROL-XL) 50 MG 24 hr tablet Take 1 tablet (50 mg total) by mouth daily. Take with or immediately following a meal. 90 tablet 3  . Multiple Vitamin (MULTIVITAMIN) tablet Take 1 tablet by mouth daily.      . rosuvastatin (CRESTOR) 40 MG tablet Take 1 tablet (40 mg total) by mouth daily. 90 tablet 1  . carisoprodol (SOMA) 350 MG  tablet Take 1 tablet (350 mg total) by mouth 4 (four) times daily as needed for muscle spasms. (Patient not taking: Reported on 01/26/2017) 20 tablet 0  . diazepam (VALIUM) 10 MG tablet 1/2- 1 tablet po prn up to BID short term (Patient not taking: Reported on 08/23/2016) 15 tablet 0   No current facility-administered medications on file prior to visit.    ROS as in subjective   Objective: BP 110/78   Pulse (!) 54   Wt 206 lb 3.2 oz (93.5 kg)   SpO2 98%   BMI 28.36 kg/m   General appearance: alert, no distress, WD/WN,  Neck: supple, no lymphadenopathy, no thyromegaly, no masses, no bruits Heart: 2/6 holosystolic murmur heard best in upper sternal borders, otherwise RRR, normal S1, S2 Lungs: CTA bilaterally, no wheezes, rhonchi, or rales Abdomen: +bs, soft, non tender, non distended, no masses, no hepatomegaly, no  splenomegaly Pulses: 2+ symmetric, upper and lower extremities, normal cap refill Ext: no edema    Assessment: Encounter Diagnoses  Name Primary?  . Dyslipidemia Yes  . Multinodular thyroid   . Essential hypertension, benign   . Stenosis of carotid artery, unspecified laterality   . Atherosclerosis of both carotid arteries   . Aortic valve stenosis, etiology of cardiac valve disease unspecified   . Elevated CK   . Screening for HPV (human papillomavirus)     Plan: Dyslipidemia - labs today since starting Crestor, c/t zetia, aspirin, fish oil OTC.  Failed prior statins.  Multinodular thyroid - plan to repeat US 07/2017, repeat labs 07/2017  HTN - c/t same medication  Atherosclerosis, carotid stenosis - reviewed echo from 04/2013, and carotid US from 07/2016.  Will check with cardiology on f/u Echo  Elevated CK - continue to monitor periodically  Screen for HPV - discussed possible testing with supervising physician, but no specific recommendation today.    He has concern as wife has had warts removed from buttock region, wife has been HPV +, and he has hx/o warts.    Gwyndolyn Saxon was seen today for follow up from new meds.  Diagnoses and all orders for this visit:  Dyslipidemia -     Comprehensive metabolic panel -     CK -     Lipid panel  Multinodular thyroid  Essential hypertension, benign  Stenosis of carotid artery, unspecified laterality  Atherosclerosis of both carotid arteries  Aortic valve stenosis, etiology of cardiac valve disease unspecified  Elevated CK -     CK  Screening for HPV (human papillomavirus)

## 2017-01-27 ENCOUNTER — Other Ambulatory Visit: Payer: Self-pay | Admitting: Medical

## 2017-01-27 ENCOUNTER — Telehealth: Payer: Self-pay | Admitting: Medical

## 2017-01-27 DIAGNOSIS — R7989 Other specified abnormal findings of blood chemistry: Secondary | ICD-10-CM

## 2017-01-27 DIAGNOSIS — R748 Abnormal levels of other serum enzymes: Secondary | ICD-10-CM

## 2017-01-27 DIAGNOSIS — R945 Abnormal results of liver function studies: Principal | ICD-10-CM

## 2017-01-27 NOTE — Telephone Encounter (Signed)
I got a reply from Dr. Gwenlyn Found.  Given the last echocardiogram US was 4 years ago, he does recommend a f/u.  Let Mr. Hilton know and he can schedule with Dr. Gwenlyn Found.

## 2017-01-27 NOTE — Telephone Encounter (Signed)
Called andl/m for pt to call us back. 

## 2017-01-27 NOTE — Telephone Encounter (Signed)
Spoke with pt about this. 

## 2017-01-28 LAB — HEPATITIS PANEL, ACUTE
HCV AB: NEGATIVE
HEP B C IGM: NONREACTIVE
HEP B S AG: NEGATIVE
Hep A IgM: NONREACTIVE

## 2017-01-29 ENCOUNTER — Ambulatory Visit
Admission: RE | Admit: 2017-01-29 | Discharge: 2017-01-29 | Disposition: A | Discharge status: Home / Self Care | Payer: Medicare Other | Source: Ambulatory Visit | Attending: Orthopedic Surgery | Admitting: Orthopedic Surgery

## 2017-01-29 DIAGNOSIS — M5442 Lumbago with sciatica, left side: Principal | ICD-10-CM

## 2017-01-29 DIAGNOSIS — M5441 Lumbago with sciatica, right side: Principal | ICD-10-CM

## 2017-01-29 DIAGNOSIS — G8929 Other chronic pain: Secondary | ICD-10-CM

## 2017-01-30 ENCOUNTER — Other Ambulatory Visit: Payer: Self-pay | Admitting: Cardiovascular Disease

## 2017-01-30 DIAGNOSIS — I350 Nonrheumatic aortic (valve) stenosis: Secondary | ICD-10-CM

## 2017-02-01 ENCOUNTER — Ambulatory Visit (HOSPITAL_COMMUNITY): Payer: Medicare Other | Attending: Cardiology

## 2017-02-01 ENCOUNTER — Other Ambulatory Visit: Payer: Self-pay

## 2017-02-01 DIAGNOSIS — I350 Nonrheumatic aortic (valve) stenosis: Secondary | ICD-10-CM

## 2017-02-01 DIAGNOSIS — I352 Nonrheumatic aortic (valve) stenosis with insufficiency: Secondary | ICD-10-CM | POA: Diagnosis not present

## 2017-02-01 DIAGNOSIS — I340 Nonrheumatic mitral (valve) insufficiency: Secondary | ICD-10-CM | POA: Diagnosis not present

## 2017-02-01 DIAGNOSIS — I361 Nonrheumatic tricuspid (valve) insufficiency: Secondary | ICD-10-CM | POA: Diagnosis not present

## 2017-02-01 DIAGNOSIS — I501 Left ventricular failure: Secondary | ICD-10-CM | POA: Diagnosis not present

## 2017-02-06 ENCOUNTER — Ambulatory Visit
Admission: RE | Admit: 2017-02-06 | Discharge: 2017-02-06 | Disposition: A | Discharge status: Home / Self Care | Payer: Medicare Other | Source: Ambulatory Visit | Attending: Medical | Admitting: Medical

## 2017-02-06 DIAGNOSIS — R7989 Other specified abnormal findings of blood chemistry: Secondary | ICD-10-CM

## 2017-02-06 DIAGNOSIS — R945 Abnormal results of liver function studies: Principal | ICD-10-CM

## 2017-02-06 DIAGNOSIS — R748 Abnormal levels of other serum enzymes: Secondary | ICD-10-CM

## 2017-02-07 ENCOUNTER — Telehealth: Payer: Self-pay | Admitting: Medical

## 2017-02-07 ENCOUNTER — Other Ambulatory Visit: Payer: Self-pay | Admitting: Cardiovascular Disease

## 2017-02-07 ENCOUNTER — Other Ambulatory Visit: Payer: Self-pay | Admitting: Medical

## 2017-02-07 DIAGNOSIS — I350 Nonrheumatic aortic (valve) stenosis: Secondary | ICD-10-CM

## 2017-02-07 DIAGNOSIS — R945 Abnormal results of liver function studies: Secondary | ICD-10-CM

## 2017-02-07 DIAGNOSIS — R7989 Other specified abnormal findings of blood chemistry: Secondary | ICD-10-CM

## 2017-02-07 DIAGNOSIS — N289 Disorder of kidney and ureter, unspecified: Secondary | ICD-10-CM

## 2017-02-07 NOTE — Telephone Encounter (Signed)
error 

## 2017-02-08 DIAGNOSIS — R7989 Other specified abnormal findings of blood chemistry: Secondary | ICD-10-CM | POA: Insufficient documentation

## 2017-02-08 DIAGNOSIS — R945 Abnormal results of liver function studies: Secondary | ICD-10-CM

## 2017-02-14 ENCOUNTER — Other Ambulatory Visit: Payer: Self-pay | Admitting: Family Medicine

## 2017-02-14 DIAGNOSIS — E785 Hyperlipidemia, unspecified: Secondary | ICD-10-CM

## 2017-03-10 ENCOUNTER — Other Ambulatory Visit: Payer: Medicare Other

## 2017-03-10 DIAGNOSIS — R7989 Other specified abnormal findings of blood chemistry: Secondary | ICD-10-CM

## 2017-03-10 DIAGNOSIS — R748 Abnormal levels of other serum enzymes: Secondary | ICD-10-CM

## 2017-03-10 DIAGNOSIS — N289 Disorder of kidney and ureter, unspecified: Secondary | ICD-10-CM

## 2017-03-10 DIAGNOSIS — R945 Abnormal results of liver function studies: Principal | ICD-10-CM

## 2017-03-10 LAB — IRON AND TIBC
%SAT: 26 % (ref 15–60)
Iron: 79 ug/dL (ref 50–180)
TIBC: 304 ug/dL (ref 250–425)
UIBC: 225 ug/dL (ref 125–400)

## 2017-03-10 LAB — COMPREHENSIVE METABOLIC PANEL
ALT: 35 U/L (ref 9–46)
AST: 29 U/L (ref 10–35)
Albumin: 3.9 g/dL (ref 3.6–5.1)
Alkaline Phosphatase: 45 U/L (ref 40–115)
BILIRUBIN TOTAL: 0.4 mg/dL (ref 0.2–1.2)
BUN: 27 mg/dL — ABNORMAL HIGH (ref 7–25)
CO2: 27 mmol/L (ref 20–31)
CREATININE: 1.25 mg/dL (ref 0.70–1.25)
Calcium: 9.1 mg/dL (ref 8.6–10.3)
Chloride: 107 mmol/L (ref 98–110)
Glucose, Bld: 94 mg/dL (ref 65–99)
Potassium: 4.4 mmol/L (ref 3.5–5.3)
SODIUM: 141 mmol/L (ref 135–146)
Total Protein: 6.3 g/dL (ref 6.1–8.1)

## 2017-03-11 LAB — HEPATITIS PANEL, ACUTE
HCV Ab: NEGATIVE
HEP B C IGM: NONREACTIVE
Hep A IgM: NONREACTIVE
Hepatitis B Surface Ag: NEGATIVE

## 2017-04-28 ENCOUNTER — Ambulatory Visit (INDEPENDENT_AMBULATORY_CARE_PROVIDER_SITE_OTHER): Payer: Medicare Other | Admitting: Family Medicine

## 2017-04-28 VITALS — BP 120/78 | HR 64 | Temp 98.2°F | Resp 16 | Wt 208.8 lb

## 2017-04-28 DIAGNOSIS — J209 Acute bronchitis, unspecified: Secondary | ICD-10-CM

## 2017-04-28 MED ORDER — AMOXICILLIN 875 MG PO TABS: 875.0000 mg | ORAL_TABLET | Freq: Two times a day (BID) | ORAL | 0 refills | Status: DC

## 2017-04-28 NOTE — Progress Notes (Signed)
   Subjective:    Patient ID: Daniel [name], male    DOB: 05/20/1946, 71 y.o.   MRN: [id number]  HPI He states that 2 weeks ago he noted the onset of left-sided headache that he has had intermittently over the last several years. He did have some fever, chills, sneezing and rhinorrhea. Within the last several days this has gotten much worse as well as nasal congestion, malaise and myalgia, productive cough and hoarse voice.   Review of Systems     Objective:   Physical Exam Alert and in no distress. Tympanic membranes and canals are normal. Pharyngeal area is normal. Neck is supple without adenopathy or thyromegaly. Cardiac exam shows a regular sinus rhythm without murmurs or gallops. Lungs are clear to auscultation. Scalp exam shows no lesions.       Assessment & Plan:  Acute bronchitis, unspecified organism - Plan: amoxicillin (AMOXIL) 875 MG tablet He will call if no improvement in his symptoms. Explained that I couldn't answer why he had the headache but I doubt it was shingles in nature since he has had this several times in the past.

## 2017-05-05 ENCOUNTER — Telehealth: Payer: Self-pay | Admitting: Family Medicine

## 2017-05-05 NOTE — Telephone Encounter (Signed)
Dr. Redmond School saw picture of blood from his nose & said to follow up if not improving in a few days from antibiotic.  Pt informed

## 2017-05-05 NOTE — Telephone Encounter (Signed)
Daniel [name] has already informed pt he came in to show a picture of his blood from his nose

## 2017-05-05 NOTE — Telephone Encounter (Signed)
Pt called and stated that he is still having issues. He states he feel better but is coughing up phlegm blood in it. He also states when he blows his nose blood clots are coming out. He says the blood is bight red. Pt uses [facility] on [address] and he can be reached at [phone].

## 2017-05-05 NOTE — Telephone Encounter (Signed)
Call in another round of Amoxil but warn him that if he continues to have blood in his coughing and from his sinuses when he finishes this antibiotic, he'll need a follow-up visit.

## 2017-05-08 ENCOUNTER — Telehealth: Payer: Self-pay | Admitting: Family Medicine

## 2017-05-08 DIAGNOSIS — J209 Acute bronchitis, unspecified: Secondary | ICD-10-CM

## 2017-05-08 MED ORDER — AMOXICILLIN 875 MG PO TABS: 875.0000 mg | ORAL_TABLET | Freq: Two times a day (BID) | ORAL | 0 refills | Status: DC

## 2017-05-08 NOTE — Telephone Encounter (Signed)
Pt called requesting the refill on Amoxicillin that he was to get on Friday. Pt also said chest congestion is not any better especially when he first wake up. He coughs a lot in the in the morning.

## 2017-07-22 ENCOUNTER — Other Ambulatory Visit: Payer: Self-pay | Admitting: Family Medicine

## 2017-07-22 DIAGNOSIS — I10 Essential (primary) hypertension: Secondary | ICD-10-CM

## 2017-09-11 ENCOUNTER — Encounter: Payer: Self-pay | Admitting: Family Medicine

## 2017-09-11 ENCOUNTER — Ambulatory Visit (INDEPENDENT_AMBULATORY_CARE_PROVIDER_SITE_OTHER): Payer: Medicare Other | Admitting: Family Medicine

## 2017-09-11 VITALS — BP 130/70 | HR 60 | Resp 18 | Wt 205.8 lb

## 2017-09-11 DIAGNOSIS — D692 Other nonthrombocytopenic purpura: Secondary | ICD-10-CM | POA: Diagnosis not present

## 2017-09-11 DIAGNOSIS — E042 Nontoxic multinodular goiter: Secondary | ICD-10-CM | POA: Diagnosis not present

## 2017-09-11 NOTE — Progress Notes (Signed)
   Subjective:    Patient ID: Daniel [name], male    DOB: 1946-04-06, 71 y.o.   MRN: [id number]  HPI He is here for 2 issues. He does have a history of multinodular goiter and does need a follow-up ultrasound on that. Also he states that he has a history of having radiation to his neck when he was a child for an enlarged thyroid. He also has lesions present on his arms that he is concerned about. He dates that any trauma to his forearm is caused lesions.   Review of Systems     Objective:   Physical Exam Alert and in no distress. Exam of the forearms do show purpuric lesions.       Assessment & Plan:  Multinodular thyroid - Plan: US Soft Tissue Head/Neck  Senile purpura ([place]) The ultrasound was ordered as per protocol. I explained senile purpura to him that at this point there is nothing of any major concern. He was comfortable with that." He also would like a note concerning [employer] changing his meter to get rid of the" Smart meter " . He states that he has concerns over potential exposure to the potential radiation. I will write a note . Explained that I had not heard anything concerning this.

## 2017-09-12 ENCOUNTER — Encounter: Payer: Self-pay | Admitting: Family Medicine

## 2017-09-15 ENCOUNTER — Ambulatory Visit
Admission: RE | Admit: 2017-09-15 | Discharge: 2017-09-15 | Disposition: A | Discharge status: Home / Self Care | Payer: Medicare Other | Source: Ambulatory Visit | Attending: Family Medicine | Admitting: Family Medicine

## 2017-09-15 DIAGNOSIS — E042 Nontoxic multinodular goiter: Secondary | ICD-10-CM

## 2017-09-18 ENCOUNTER — Other Ambulatory Visit: Payer: Self-pay | Admitting: Family Medicine

## 2017-09-18 DIAGNOSIS — E042 Nontoxic multinodular goiter: Secondary | ICD-10-CM

## 2017-10-26 ENCOUNTER — Other Ambulatory Visit: Payer: Self-pay | Admitting: Family Medicine

## 2017-10-26 DIAGNOSIS — I10 Essential (primary) hypertension: Secondary | ICD-10-CM

## 2018-01-30 ENCOUNTER — Other Ambulatory Visit: Payer: Self-pay | Admitting: Family Medicine

## 2018-01-30 DIAGNOSIS — I10 Essential (primary) hypertension: Secondary | ICD-10-CM

## 2018-01-30 NOTE — Telephone Encounter (Signed)
Called pt to set up appt. No answer and could not leave a voice mail. Me dwill be sent in for 30 days [facility]

## 2018-02-12 ENCOUNTER — Ambulatory Visit (INDEPENDENT_AMBULATORY_CARE_PROVIDER_SITE_OTHER): Payer: Medicare Other | Admitting: Medical

## 2018-02-12 ENCOUNTER — Encounter: Payer: Self-pay | Admitting: Medical

## 2018-02-12 VITALS — BP 130/82 | HR 61 | Temp 97.7°F | Ht 71.0 in | Wt 211.0 lb

## 2018-02-12 DIAGNOSIS — J111 Influenza due to unidentified influenza virus with other respiratory manifestations: Secondary | ICD-10-CM | POA: Diagnosis not present

## 2018-02-12 DIAGNOSIS — R062 Wheezing: Secondary | ICD-10-CM

## 2018-02-12 DIAGNOSIS — J988 Other specified respiratory disorders: Secondary | ICD-10-CM

## 2018-02-12 NOTE — Progress Notes (Signed)
Subjective:  Daniel [name] is a 72 y.o. male who presents for recheck on sickness Chief Complaint  Patient presents with  . Acute Visit    chest congestion, nose running, achy, went to urgent care, had flu/bronchitis, continued congestion/wheezy    Started having phlegm, cough, congestion this past week about 3-4 days ago.   Has been having cough, phlegm, head and chest congestion, the over the next day started having body aches.  Gradually got worse.   Was seen [facility] this past Saturday 2 days ago.  Tested + for flu, had "infiltrates" early pneumonia on chest xray, was told he had pneumonia, bronchitis, and flu.   Was prescribed Xofluza, Prednisone taper and Zpak.    Felt improvement with the Xofluza.  This morning awoke in the morning with wheezing.  Still has congestion and body aches.   reports sick contacts. Patient is not a smoker.  No other aggravating or relieving factors.  No other complaint.    Past Medical History:  Diagnosis Date  . Abdominal pain    with coffee  . Abdominal ultrasound, abnormal 12/09/09   fatty liver disease  . Abnormal carotid ultrasound 03/01/07   R no ICA stenosis. 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 Allergy    allergy shots, cat allergy; Dr. Mosetta Anis;  . Arthritis   . Diverticulosis   . Dyslipidemia   . Fatty liver   . H/O CT scan of abdomen 03/20/12   abdomen/pelvis - normal  . H/O CT scan of chest 03/20/12   normal  . H/O diagnostic ultrasound 03/06/11   Aortic ultrasound - no AAA identified  . H/O echocardiogram 10/18/2011   transthoracic - mild concentric LVH, EF 55%, mild valvular aortic stenosis, LA mild to moderate dilation, otherwise normal echo; Dr. Rollene Fare  . Heart murmur, aortic    cardiac eval 09/2011, Dr. Gwenlyn Found [facility]), mild aortic stenosis  . Hypertension   . Inguinal hernia 2012   right  . Normal cardiac stress test 03/02/07   Myoview, EF 65%; Dr. Golden Hurter  . Statin intolerance    intolerance with Lipitor  . Testicular pain 01/08/10   Urology eval, Dr. Alinda Money    Current Outpatient Medications on File Prior to Visit  Medication Sig Dispense Refill  . aspirin 81 MG tablet Take 1 tablet (81 mg total) by mouth daily. 90 tablet 3  . azithromycin (ZITHROMAX) 250 MG tablet   0  . ezetimibe (ZETIA) 10 MG tablet Take 1 tablet (10 mg total) by mouth daily. 90 tablet 3  . fish oil-omega-3 fatty acids 1000 MG capsule Take 1 g by mouth daily.      . metoprolol succinate (TOPROL-XL) 50 MG 24 hr tablet TAKE 1 TABLET( 50 MG TOTAL) BY MOUTH DAILY. TAKE WITH OR IMMEDIATELY FOLLOWING A MEAL. 30 tablet 0  . Multiple Vitamin (MULTIVITAMIN) tablet Take 1 tablet by mouth daily.      . predniSONE (STERAPRED UNI-PAK 21 TAB) 10 MG (21) TBPK tablet FPD  0  . rosuvastatin (CRESTOR) 40 MG tablet Take 1 tablet (40 mg total) by mouth daily. 90 tablet 1  . carisoprodol (SOMA) 350 MG tablet Take 1 tablet (350 mg total) by mouth 4 (four) times daily as needed for muscle spasms. (Patient not taking: Reported on 01/26/2017) 20 tablet 0  . diazepam (VALIUM) 10 MG tablet 1/2- 1 tablet po prn up to BID short term (Patient not taking: Reported on 08/23/2016) 15 tablet 0  .  LYRICA 75 MG capsule   1   No current facility-administered medications on file prior to visit.     ROS as in subjective   Objective: BP 130/82 (BP Location: Right Arm, Patient Position: Sitting, Cuff Size: Normal)   Pulse 61   Temp 97.7 F (36.5 C) (Oral)   Ht 5\' 11"  (1.803 m)   Wt 211 lb (95.7 kg)   SpO2 98%   BMI 29.43 kg/m   General appearance: Alert, well developed, well nourished, no distress                             Skin: warm, no rash                           Head: no sinus tenderness,                            Eyes: conjunctiva normal, corneas clear                            Ears: flat left tympanic membrane, flat right tympanic membrane, external ear canals normal                          Nose: septum midline,  turbinates swollen, with erythema and clear discharge             Mouth/throat: MMM, tongue normal, mild pharyngeal erythema with mucoid drainage                           Neck: supple, no adenopathy, no thyromegaly, non tender                         Lungs: bronchial sounds with mild wheezes, no rales, no rhonchi        Assessment  Encounter Diagnoses  Name Primary?  . Wheezing Yes  . Respiratory tract infection   . Influenza       Plan: Discussed diagnosis of influenza and possibly early pneumonia per his report from urgent care.   He doesn't look toxic today, seems to be clinically improving.  Discussed usual time frame to see improvement. Discussed possible complications or symptoms that would prompt call back or recheck within the next few days.     Medications:  Elizebeth Koller out the Zpak and Prednisone prescribed by urgent care. We discussed proper use of his albuterol inhaler and he can continue this q4-6 hours prn.  He declines breathing treatment today  Specific home care recommendations discussed:  Begin Mucinex DM for mucous and cough suppression  Pain/fever relief: You may use over-the-counter Tylenol for pain or fever  Drink extra fluids. Fluids help thin the mucus so your sinuses can drain more easily.   Use saline nasal sprays to help moisten your sinuses. The sprays can be found at your local drugstore.   Patient was advised to call or return if worse or not improving in the next few days.    Patient voiced understanding of diagnosis, recommendations, and treatment plan.  F/u prn

## 2018-02-14 ENCOUNTER — Other Ambulatory Visit: Payer: Self-pay

## 2018-02-14 ENCOUNTER — Ambulatory Visit (HOSPITAL_COMMUNITY): Payer: Medicare Other | Attending: Cardiology

## 2018-02-14 DIAGNOSIS — E785 Hyperlipidemia, unspecified: Secondary | ICD-10-CM | POA: Diagnosis not present

## 2018-02-14 DIAGNOSIS — I10 Essential (primary) hypertension: Secondary | ICD-10-CM | POA: Diagnosis not present

## 2018-02-14 DIAGNOSIS — I352 Nonrheumatic aortic (valve) stenosis with insufficiency: Secondary | ICD-10-CM | POA: Insufficient documentation

## 2018-02-14 DIAGNOSIS — I350 Nonrheumatic aortic (valve) stenosis: Secondary | ICD-10-CM | POA: Diagnosis present

## 2018-02-22 ENCOUNTER — Telehealth: Payer: Self-pay | Admitting: Cardiovascular Disease

## 2018-02-22 NOTE — Telephone Encounter (Signed)
LMTCB  Notes recorded by Waylan Rocher, LPN on 7/51/7001 at 7:49 PM EDT Patient called. Left message for patient to call back. ------  Notes recorded by [name], MD on 02/22/2018 at 9:07 AM EDT Mild aortic stenosis has remained stable. No office follow-up necessary at this time ------  Notes recorded by Therisa Doyne on 02/21/2018 at 3:43 PM EDT Pt last seen in 2014. Need follow-up? ------  Notes recorded by [name], MD on 02/14/2018 at 1:22 PM EDT Normal LV systolic function, grade 2 diastolic dysfunction, mild AS/AI

## 2018-02-22 NOTE — Telephone Encounter (Signed)
Patient aware of results. He would like this test repeated next year Is this OK to order since he has not been seen?

## 2018-02-22 NOTE — Telephone Encounter (Signed)
New Message ° ° ° ° °Patient returning call for echo results. °

## 2018-02-27 ENCOUNTER — Other Ambulatory Visit: Payer: Self-pay | Admitting: Medical

## 2018-02-27 ENCOUNTER — Other Ambulatory Visit: Payer: Self-pay | Admitting: Family Medicine

## 2018-02-27 DIAGNOSIS — I10 Essential (primary) hypertension: Secondary | ICD-10-CM

## 2018-02-27 DIAGNOSIS — E785 Hyperlipidemia, unspecified: Secondary | ICD-10-CM

## 2018-02-27 NOTE — Telephone Encounter (Signed)
Called pt on home phone and cell to schedule a med check or CPE. No answer and unable to leave voicemail. Will send in 30 day script to make sure he does not run out of med .[place]

## 2018-03-01 NOTE — Telephone Encounter (Signed)
Hat is fine with me

## 2018-03-14 ENCOUNTER — Other Ambulatory Visit: Payer: Self-pay | Admitting: Cardiovascular Disease

## 2018-03-14 DIAGNOSIS — I350 Nonrheumatic aortic (valve) stenosis: Secondary | ICD-10-CM

## 2018-03-14 NOTE — Telephone Encounter (Signed)
Repeat order entered to repeat echo in 1 year per pt request.

## 2018-04-02 ENCOUNTER — Other Ambulatory Visit: Payer: Self-pay | Admitting: Family Medicine

## 2018-04-02 ENCOUNTER — Telehealth: Payer: Self-pay

## 2018-04-02 DIAGNOSIS — I10 Essential (primary) hypertension: Secondary | ICD-10-CM

## 2018-04-02 NOTE — Telephone Encounter (Signed)
Pt was called to let him know that he needs to make an appt . 30 day supply  Was sent in. [facility]

## 2018-04-10 IMAGING — US US CAROTID DUPLEX BILAT
1 series · 13 of 24 positions shown · non-contrast
Comparison: None.

CLINICAL DATA: Atherosclerosis of native coronary artery. History
of hypertension and hyperlipidemia.

EXAM:
BILATERAL CAROTID DUPLEX ULTRASOUND
TECHNIQUE: Gray scale imaging, color Doppler and duplex ultrasound were
performed of bilateral carotid and vertebral arteries in the neck.

[Series 1: us carotid duplex bilat · 0.08mm/px · 13 of 72 slices shown]
[im 1/72]
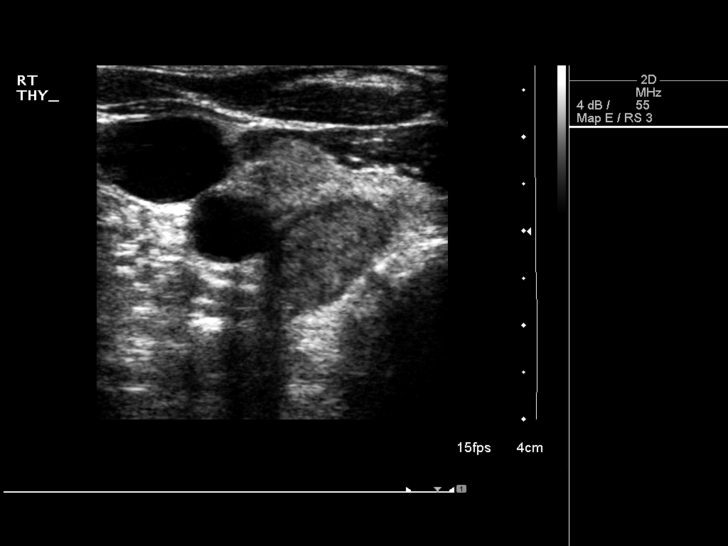
[im 7/72]
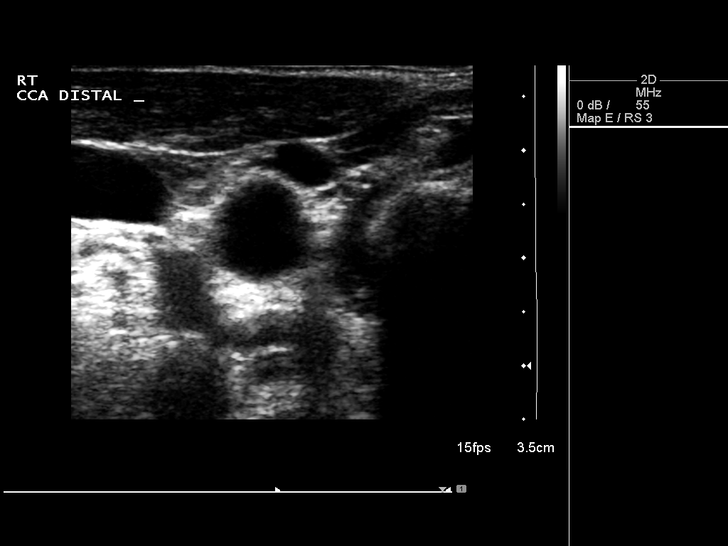
[im 13/72]
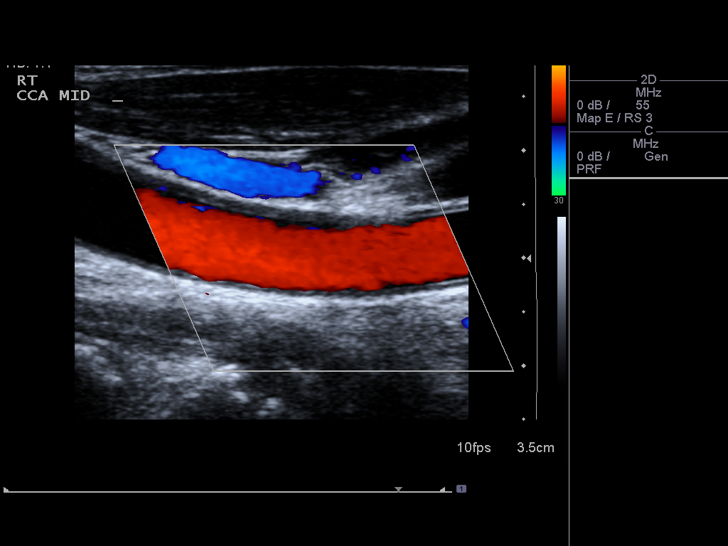
[im 19/72]
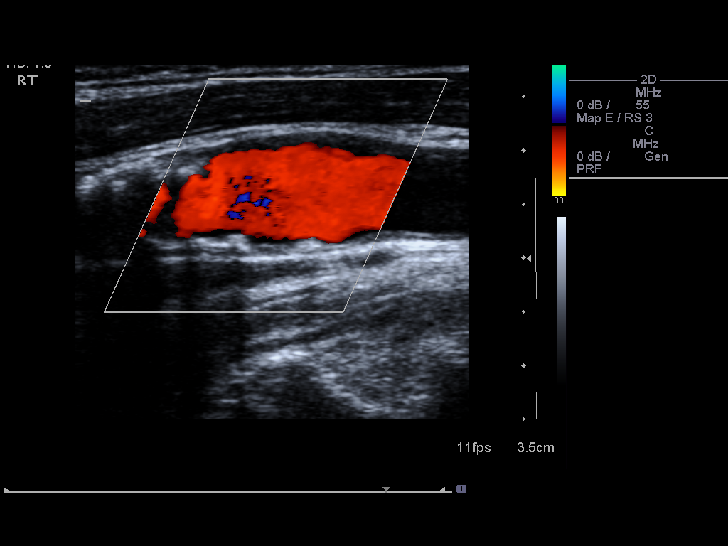
[im 25/72]
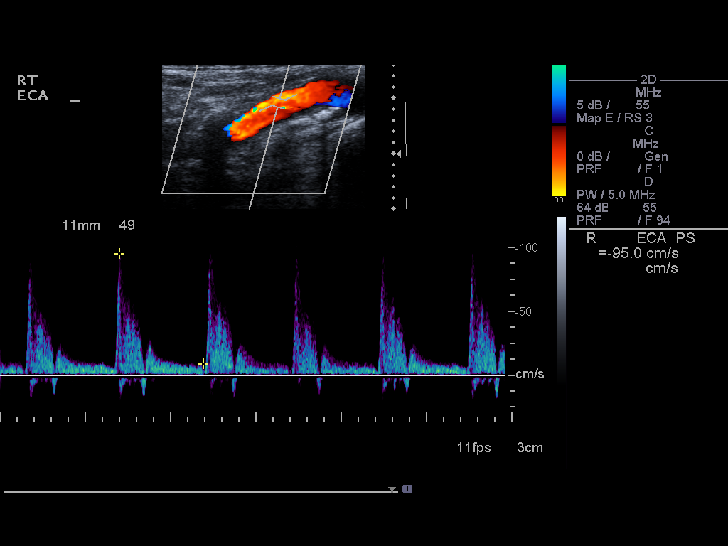
[im 31/72]
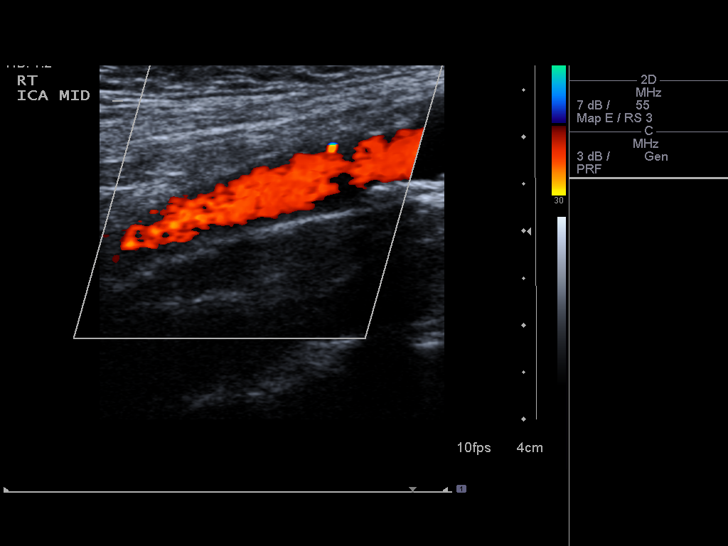
[im 38/72]
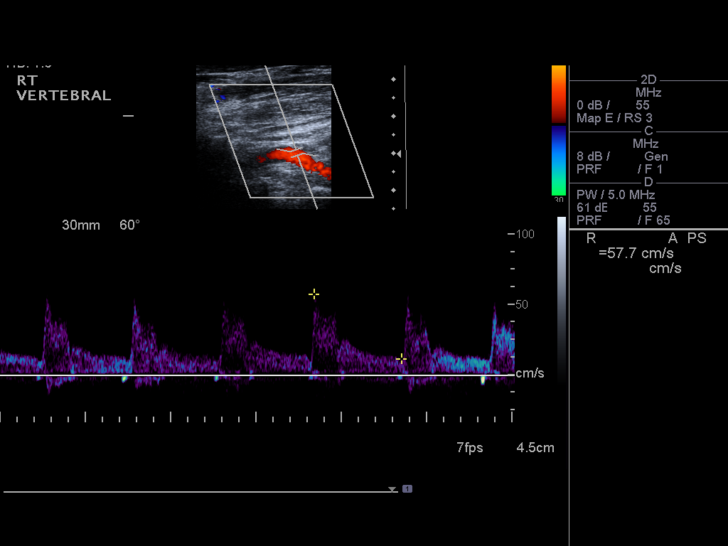
[im 41/72]
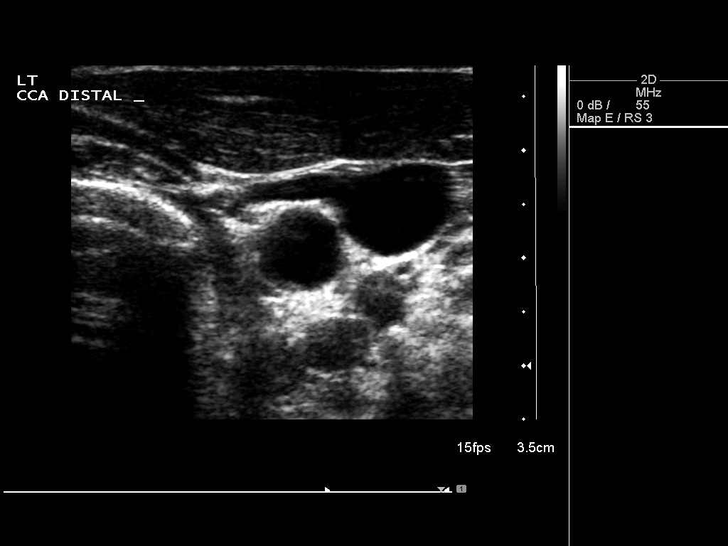
[im 47/72]
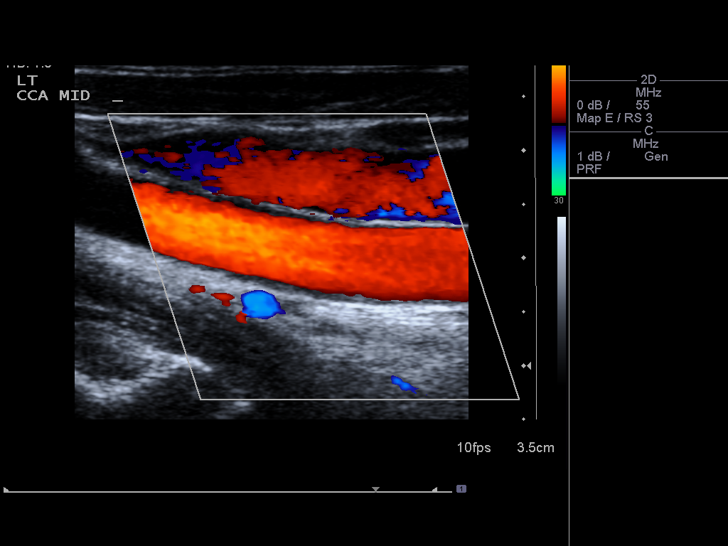
[im 53/72]
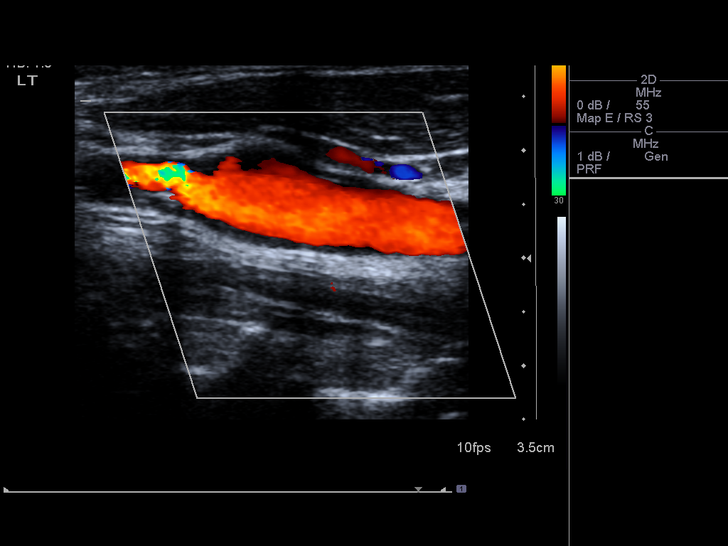
[im 59/72]
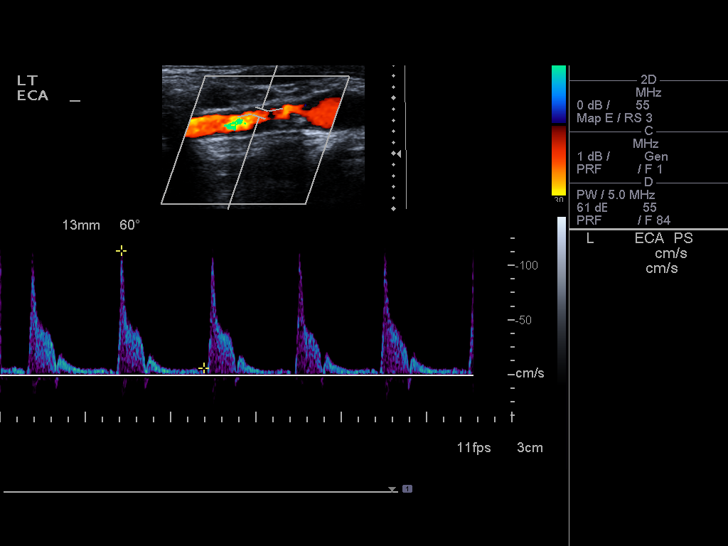
[im 65/72]
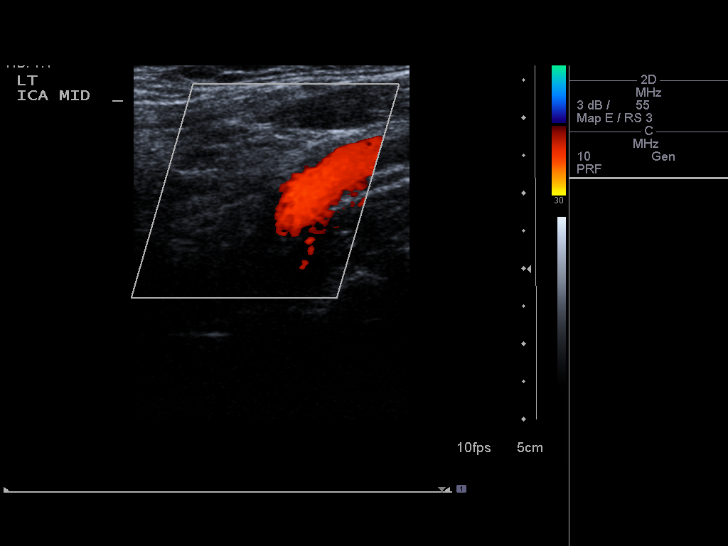
[im 72/72]
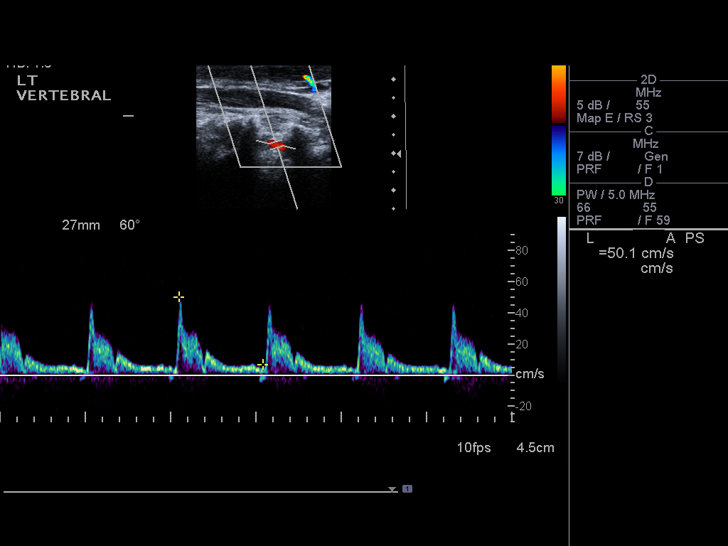

[13 of 24 positions shown; findings below may reference images not displayed]

FINDINGS: Criteria: Quantification of carotid stenosis is based on velocity
parameters that correlate the residual internal carotid diameter
with NASCET-based stenosis levels, using the diameter of the distal
internal carotid lumen as the denominator for stenosis measurement.

The following velocity measurements were obtained:

RIGHT

ICA:  57 cm/sec

CCA:  72 cm/sec

SYSTOLIC ICA/CCA RATIO:

DIASTOLIC ICA/CCA RATIO:

ECA:  95 cm/sec

LEFT

ICA:  67 cm/sec

CCA:  91 cm/sec

SYSTOLIC ICA/CCA RATIO:

DIASTOLIC ICA/CCA RATIO:

ECA:  114 cm/sec

RIGHT CAROTID ARTERY: Small amount of irregular echogenic plaque at
the right carotid bulb. Right external carotid artery is patent.
Mild echogenic plaque in the proximal internal carotid artery
without significant stenosis. Normal waveforms and velocities in the
right internal carotid artery.

RIGHT VERTEBRAL ARTERY: Antegrade flow and normal waveform in the
right vertebral artery.

LEFT CAROTID ARTERY: Irregular echogenic plaque in the proximal
external carotid artery without significant stenosis. Left internal
carotid artery is patent without significant plaque or stenosis.

LEFT VERTEBRAL ARTERY: Antegrade flow and normal waveform in the
left vertebral artery.

There is a solid slightly hypoechoic right thyroid nodule. This
measures up to 1.3 cm.
IMPRESSION: Mild atherosclerotic disease in the carotid arteries. No significant
carotid artery stenosis. Estimated degree of stenosis in the
internal carotid arteries is less than 50% bilaterally.

Patent vertebral arteries with antegrade flow.

Right thyroid nodule measuring up to 1.3 cm. Recommend a dedicated
thyroid ultrasound.

## 2018-05-02 ENCOUNTER — Other Ambulatory Visit: Payer: Self-pay | Admitting: Family Medicine

## 2018-05-02 DIAGNOSIS — I10 Essential (primary) hypertension: Secondary | ICD-10-CM

## 2018-05-07 ENCOUNTER — Encounter: Payer: Self-pay | Admitting: Medical

## 2018-05-07 ENCOUNTER — Ambulatory Visit (INDEPENDENT_AMBULATORY_CARE_PROVIDER_SITE_OTHER): Payer: Medicare Other | Admitting: Medical

## 2018-05-07 VITALS — BP 140/88 | HR 54 | Temp 98.0°F | Ht 71.0 in | Wt 208.2 lb

## 2018-05-07 DIAGNOSIS — I6523 Occlusion and stenosis of bilateral carotid arteries: Secondary | ICD-10-CM | POA: Diagnosis not present

## 2018-05-07 DIAGNOSIS — M5030 Other cervical disc degeneration, unspecified cervical region: Secondary | ICD-10-CM

## 2018-05-07 DIAGNOSIS — Z0000 Encounter for general adult medical examination without abnormal findings: Secondary | ICD-10-CM | POA: Diagnosis not present

## 2018-05-07 DIAGNOSIS — Z7189 Other specified counseling: Secondary | ICD-10-CM | POA: Insufficient documentation

## 2018-05-07 DIAGNOSIS — I350 Nonrheumatic aortic (valve) stenosis: Secondary | ICD-10-CM

## 2018-05-07 DIAGNOSIS — I10 Essential (primary) hypertension: Secondary | ICD-10-CM | POA: Diagnosis not present

## 2018-05-07 DIAGNOSIS — E785 Hyperlipidemia, unspecified: Secondary | ICD-10-CM

## 2018-05-07 DIAGNOSIS — Z125 Encounter for screening for malignant neoplasm of prostate: Secondary | ICD-10-CM

## 2018-05-07 DIAGNOSIS — I6529 Occlusion and stenosis of unspecified carotid artery: Secondary | ICD-10-CM

## 2018-05-07 DIAGNOSIS — Z129 Encounter for screening for malignant neoplasm, site unspecified: Secondary | ICD-10-CM | POA: Diagnosis not present

## 2018-05-07 DIAGNOSIS — L821 Other seborrheic keratosis: Secondary | ICD-10-CM | POA: Diagnosis not present

## 2018-05-07 DIAGNOSIS — E042 Nontoxic multinodular goiter: Secondary | ICD-10-CM | POA: Diagnosis not present

## 2018-05-07 NOTE — Patient Instructions (Addendum)
Thanks for trusting [place] with your health care and for coming in for a physical today.  Below are some general recommendations I have for you:  Yearly screenings See your eye doctor yearly for routine vision care. See your dentist yearly for routine dental care including hygiene visits twice yearly. See me here yearly for a routine physical and preventative care visit   Specific Concerns today:  . Please turn the stool cards back in.  This is an intermediate colon cancer screen . We will call with lab results  . Get your flu shot yearly . You will be due for repeat thyroid ultrasound in 09/2018.   Please follow up yearly for a physical.   Preventative Care for Adults - Male      [place]:  A routine yearly physical is a good way to check in with your primary care provider about your health and preventive screening. It is also an opportunity to share updates about your health and any concerns you have, and receive a thorough all-over exam.   Most health insurance companies pay for at least some preventative services.  Check with your health plan for specific coverages.  WHAT PREVENTATIVE SERVICES DO WOMEN NEED?  Adult men should have their weight and blood pressure checked regularly.   Men age 74 and older should have their cholesterol levels checked regularly.  Beginning at age 74 and continuing to age 26, men should be screened for colorectal cancer.  Certain people may need continued testing until age 58.  Updating vaccinations is part of preventative care.  Vaccinations help protect against diseases such as the flu.  Osteoporosis is a disease in which the bones lose minerals and strength as we age. Men ages 60 and over should discuss this with their caregivers  Lab tests are generally done as part of preventative care to screen for anemia and blood disorders, to screen for problems with the kidneys and liver, to screen for bladder problems, to check blood  sugar, and to check your cholesterol level.  Preventative services generally include counseling about diet, exercise, avoiding tobacco, drugs, excessive alcohol consumption, and sexually transmitted infections.    GENERAL RECOMMENDATIONS FOR GOOD HEALTH:  Healthy diet:  Eat a variety of foods, including fruit, vegetables, animal or vegetable protein, such as meat, fish, chicken, and eggs, or beans, lentils, tofu, and grains, such as rice.  Drink plenty of water daily.  Decrease saturated fat in the diet, avoid lots of red meat, processed foods, sweets, fast foods, and fried foods.  Exercise:  Aerobic exercise helps maintain good heart health. At least 30-40 minutes of moderate-intensity exercise is recommended. For example, a brisk walk that increases your heart rate and breathing. This should be done on most days of the week.   Find a type of exercise or a variety of exercises that you enjoy so that it becomes a part of your daily life.  Examples are running, walking, swimming, water aerobics, and biking.  For motivation and support, explore group exercise such as aerobic class, spin class, Zumba, Yoga,or  martial arts, etc.    Set exercise goals for yourself, such as a certain weight goal, walk or run in a race such as a 5k walk/run.  Speak to your primary care provider about exercise goals.  Disease prevention:  If you smoke or chew tobacco, find out from your caregiver how to quit. It can literally save your life, no matter how long you have been a tobacco  user. If you do not use tobacco, never begin.   Maintain a healthy diet and normal weight. Increased weight leads to problems with blood pressure and diabetes.   The Body Mass Index or BMI is a way of measuring how much of your body is fat. Having a BMI above 27 increases the risk of heart disease, diabetes, hypertension, stroke and other problems related to obesity. Your caregiver can help determine your BMI and based on it develop  an exercise and dietary program to help you achieve or maintain this important measurement at a healthful level.  High blood pressure causes heart and blood vessel problems.  Persistent high blood pressure should be treated with medicine if weight loss and exercise do not work.   Fat and cholesterol leaves deposits in your arteries that can block them. This causes heart disease and vessel disease elsewhere in your body.  If your cholesterol is found to be high, or if you have heart disease or certain other medical conditions, then you may need to have your cholesterol monitored frequently and be treated with medication.   Ask if you should have a cardiac stress test if your history suggests this. A stress test is a test done on a treadmill that looks for heart disease. This test can find disease prior to there being a problem.  Osteoporosis is a disease in which the bones lose minerals and strength as we age. This can result in serious bone fractures. Risk of osteoporosis can be identified using a bone density scan. Men ages 24 and over should discuss this with their caregivers. Ask your caregiver whether you should be taking a calcium supplement and Vitamin D, to reduce the rate of osteoporosis.   Avoid drinking alcohol in excess (more than two drinks per day).  Avoid use of street drugs. Do not share needles with anyone. Ask for professional help if you need assistance or instructions on stopping the use of alcohol, cigarettes, and/or drugs.  Brush your teeth twice a day with fluoride toothpaste, and floss once a day. Good oral hygiene prevents tooth decay and gum disease. The problems can be painful, unattractive, and can cause other health problems. Visit your dentist for a routine oral and dental check up and preventive care every 6-12 months.   Look at your skin regularly.  Use a mirror to look at your back. Notify your caregivers of changes in moles, especially if there are changes in shapes,  colors, a size larger than a pencil eraser, an irregular border, or development of new moles.  Safety:  Use seatbelts 100% of the time, whether driving or as a passenger.  Use safety devices such as hearing protection if you work in environments with loud noise or significant background noise.  Use safety glasses when doing any work that could send debris in to the eyes.  Use a helmet if you ride a bike or motorcycle.  Use appropriate safety gear for contact sports.  Talk to your caregiver about gun safety.  Use sunscreen with a SPF (or skin protection factor) of 15 or greater.  Lighter skinned people are at a greater risk of skin cancer. Don't forget to also wear sunglasses in order to protect your eyes from too much damaging sunlight. Damaging sunlight can accelerate cataract formation.   Practice safe sex. Use condoms. Condoms are used for birth control and to help reduce the spread of sexually transmitted infections (or STIs).  Some of the STIs are gonorrhea (the clap), chlamydia,  syphilis, trichomonas, herpes, HPV (human papilloma virus) and HIV (human immunodeficiency virus) which causes AIDS. The herpes, HIV and HPV are viral illnesses that have no cure. These can result in disability, cancer and death.   Keep carbon monoxide and smoke detectors in your home functioning at all times. Change the batteries every 6 months or use a model that plugs into the wall.   Vaccinations:  Stay up to date with your tetanus shots and other required immunizations. You should have a booster for tetanus every 10 years. Be sure to get your flu shot every year, since 5%-20% of the [place] population comes down with the flu. The flu vaccine changes each year, so being vaccinated once is not enough. Get your shot in the fall, before the flu season peaks.   Other vaccines to consider:  Human Papilloma Virus or HPV causes cancer of the cervix, and other infections that can be transmitted from person to person. There is  a vaccine for HPV, and males should get immunized between the ages of 61 and 73. It requires a series of 3 shots.   Pneumococcal vaccine to protect against certain types of pneumonia.  This is normally recommended for adults age 58 or older.  However, adults younger than 70 years old with certain underlying conditions such as diabetes, heart or lung disease should also receive the vaccine.  Shingles vaccine to protect against Varicella Zoster if you are older than age 69, or younger than 70 years old with certain underlying illness.  If you have not had the Shingrix vaccine, please call your insurer to inquire about coverage for the Shingrix vaccine given in 2 doses.   Some insurers cover this vaccine after age 65, some cover this after age 61.  If your insurer covers this, then call to schedule appointment to have this vaccine here  Hepatitis A vaccine to protect against a form of infection of the liver by a virus acquired from food.  Hepatitis B vaccine to protect against a form of infection of the liver by a virus acquired from blood or body fluids, particularly if you work in health care.  If you plan to travel internationally, check with your local health department for specific vaccination recommendations.   What should I know about cancer screening? Many types of cancers can be detected early and may often be prevented. Lung Cancer  You should be screened every year for lung cancer if: ? You are a current smoker who has smoked for at least 30 years. ? You are a former smoker who has quit within the past 15 years.  Talk to your health care provider about your screening options, when you should start screening, and how often you should be screened.  Colorectal Cancer  Routine colorectal cancer screening usually begins at 70 years of age and should be repeated every 5-10 years until you are 70 years old. You may need to be screened more often if early forms of precancerous polyps or  small growths are found. Your health care provider may recommend screening at an earlier age if you have risk factors for colon cancer.  Your health care provider may recommend using home test kits to check for hidden blood in the stool.  A small camera at the end of a tube can be used to examine your colon (sigmoidoscopy or colonoscopy). This checks for the earliest forms of colorectal cancer.  Prostate and Testicular Cancer  Depending on your age and overall health, your health  care provider may do certain tests to screen for prostate and testicular cancer.  Talk to your health care provider about any symptoms or concerns you have about testicular or prostate cancer.  Skin Cancer  Check your skin from head to toe regularly.  Tell your health care provider about any new moles or changes in moles, especially if: ? There is a change in a mole's size, shape, or color. ? You have a mole that is larger than a pencil eraser.  Always use sunscreen. Apply sunscreen liberally and repeat throughout the day.  Protect yourself by wearing long sleeves, pants, a wide-brimmed hat, and sunglasses when outside.

## 2018-05-07 NOTE — Progress Notes (Addendum)
Subjective:    Daniel [name] is a 72 y.o. male who presents for Preventative Services visit and chronic medical problems/med check visit.    Primary Care Provider Jereline Ticer, Camelia Eng, PA-C here for primary care  Current Health Care Team:  Dentist, Dr. Jani Gravel doctor, Dr. Nicki Reaper  Dr. Quay Burow, cardiology  Dr. [name], GI  Medical Services you may have received from other than [facility] providers in the past year (date may be approximate) none  Exercise Current exercise habits: Home exercise routine includes stretching.   Nutrition/Diet  Current diet: well balanced  Depression Screen Depression screen PHQ 2/9 05/07/2018  Decreased Interest 0  Down, Depressed, Hopeless 0  PHQ - 2 Score 0    Activities of Daily Living Screen/Functional Status Survey Is the patient deaf or have difficulty hearing?: No Does the patient have difficulty seeing, even when wearing glasses/contacts?: No Does the patient have difficulty concentrating, remembering, or making decisions?: No Does the patient have difficulty walking or climbing stairs?: No Does the patient have difficulty dressing or bathing?: No Does the patient have difficulty doing errands alone such as visiting a doctor's office or shopping?: No  Can patient draw a clock face showing 3:15 oclock, yes  Fall Risk Screen Fall Risk  05/07/2018 09/11/2017 07/30/2014  Falls in the past year? No No No    Gait Assessment: Normal gait observed yes  Advanced directives Does patient have a [place]? No Does patient have a Living Will? No  Past Medical History:  Diagnosis Date  . Abdominal pain    with coffee  . Abdominal ultrasound, abnormal 12/09/09   fatty liver disease  . Abnormal carotid ultrasound 03/01/07   R no ICA stenosis. 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 Allergy    allergy shots, cat allergy; Dr. Mosetta Anis;  . Arthritis   . Diverticulosis   . Dyslipidemia   .  Fatty liver   . H/O CT scan of abdomen 03/20/12   abdomen/pelvis - normal  . H/O CT scan of chest 03/20/12   normal  . H/O diagnostic ultrasound 03/06/11   Aortic ultrasound - no AAA identified  . H/O echocardiogram 10/18/2011   transthoracic - mild concentric LVH, EF 55%, mild valvular aortic stenosis, LA mild to moderate dilation, otherwise normal echo; Dr. Rollene Fare  . Heart murmur, aortic    cardiac eval 09/2011, Dr. Gwenlyn [facility]), mild aortic stenosis  . Hypertension   . Inguinal hernia 2012   right  . Normal cardiac stress test 03/02/07   Myoview, EF 65%; Dr. Golden Hurter  . Statin intolerance    intolerance with Lipitor  . Testicular pain 01/08/10   Urology eval, Dr. Alinda Money    Past Surgical History:  Procedure Laterality Date  . COLONOSCOPY  10/21/2008   internal hemorrhoids, diverticulosis; repeat 10/2018; Dr. Scarlette Shorts, [place]  . HAND SURGERY  2004   left ganglion cyst removed   . NOSE SURGERY  2005   obstruction, polyp and deviated septum; Dr. Redmond Baseman  . SHOULDER SURGERY  2002, 2008   bilat, RTC    Social History   Socioeconomic History  . Marital status: Married    Spouse name: Not on file  . Number of children: Not on file  . Years of education: Not on file  . Highest education level: Not on file  Occupational History  . Occupation: Presenter, broadcasting; part time [employer]    Employer: [employer]  Social Needs  .  Financial resource strain: Not on file  . Food insecurity:    Worry: Not on file    Inability: Not on file  . Transportation needs:    Medical: Not on file    Non-medical: Not on file  Tobacco Use  . Smoking status: Never Smoker  . Smokeless tobacco: Never Used  Substance and Sexual Activity  . Alcohol use: Yes    Alcohol/week: 1.2 oz    Types: 2 Cans of beer per week    Comment: beer once or twice per week  . Drug use: No  . Sexual activity: Yes  Lifestyle  . Physical activity:    Days per week: Not on file    Minutes per session: Not on  file  . Stress: Not on file  Relationships  . Social connections:    Talks on phone: Not on file    Gets together: Not on file    Attends religious service: Not on file    Active member of club or organization: Not on file    Attends meetings of clubs or organizations: Not on file    Relationship status: Not on file  . Intimate partner violence:    Fear of current or ex partner: Not on file    Emotionally abused: Not on file    Physically abused: Not on file    Forced sexual activity: Not on file  Other Topics Concern  . Not on file  Social History Narrative   Married, no children, 2 dogs and a cat, exercise - aerobics, some weight bearing    Family History  Problem Relation Age of Onset  . Cancer Mother        breast  . Dementia Mother 11  . Kidney disease Father 75       kidney failure  . Rheum arthritis Father   . Diabetes Neg Hx   . Heart disease Neg Hx   . Stroke Neg Hx   . Hypertension Neg Hx   . Hyperlipidemia Neg Hx      Current Outpatient Medications:  .  aspirin 81 MG tablet, Take 1 tablet (81 mg total) by mouth daily., Disp: 90 tablet, Rfl: 3 .  carisoprodol (SOMA) 350 MG tablet, Take 1 tablet (350 mg total) by mouth 4 (four) times daily as needed for muscle spasms., Disp: 20 tablet, Rfl: 0 .  ezetimibe (ZETIA) 10 MG tablet, Take 1 tablet (10 mg total) by mouth daily., Disp: 90 tablet, Rfl: 3 .  fish oil-omega-3 fatty acids 1000 MG capsule, Take 1 g by mouth daily.  , Disp: , Rfl:  .  LYRICA 75 MG capsule, , Disp: , Rfl: 1 .  metoprolol succinate (TOPROL-XL) 50 MG 24 hr tablet, TAKE 1 TABLET BY MOUTH DAILY WITH OR IMMEDIATELY FOLLOWING A MEAL, Disp: 30 tablet, Rfl: 0 .  Multiple Vitamin (MULTIVITAMIN) tablet, Take 1 tablet by mouth daily.  , Disp: , Rfl:  .  rosuvastatin (CRESTOR) 40 MG tablet, Take 1 tablet (40 mg total) by mouth daily., Disp: 90 tablet, Rfl: 1 .  diazepam (VALIUM) 10 MG tablet, 1/2- 1 tablet po prn up to BID short term (Patient not taking:  Reported on 05/07/2018), Disp: 15 tablet, Rfl: 0  Allergies  Allergen Reactions  . Codeine Nausea Only and Other (See Comments)    Dizziness, sweat and will make patient pass out.  . Lipitor [Atorvastatin]     Elevated LFTs, no symptoms  . Sulfa Antibiotics Rash    History reviewed:  allergies, current medications, past family history, past medical history, past social history, past surgical history and problem list  Chronic issues discussed: Hypertension-compliant with medication  Atherosclerosis, dyslipidemia-compliant with medication  Acute issues discussed: Had some pain and pulled muscle in back earlier in the year.  Had seen ortho, had MRI, had some abnormalities on MRI L spine.  Ended up having steroid shots on both sides of spine by ortho.  If sits on toilet for while, gets pain in left.     Few days ago had bad right sided ear ache.  No other URI symptoms  Wife had anal warts this past year on exam by gyn.  She has 2 new ones.  He has questions about this.   Objective:      Biometrics BP 140/88   Pulse (!) 54   Temp 98 F (36.7 C) (Oral)   Ht 5\' 11"  (1.803 m)   Wt 208 lb 3.2 oz (94.4 kg)   SpO2 97%   BMI 29.04 kg/m   Wt Readings from Last 3 Encounters:  05/07/18 208 lb 3.2 oz (94.4 kg)  02/12/18 211 lb (95.7 kg)  09/11/17 205 lb 12.8 oz (93.4 kg)   BP Readings from Last 3 Encounters:  05/07/18 140/88  02/12/18 130/82  09/11/17 130/70     Cognitive Testing  Alert? Yes  Normal Appearance?Yes  Oriented to person? Yes  Place? Yes   Time? Yes  Recall of three objects?  Yes  Can perform simple calculations? Yes  Displays appropriate judgment?Yes  Can read the correct time from a watch face?Yes  General appearance: alert, no distress, WD/WN, white male  Nutritional Status: Inadequate calore intake? no Loss of muscle mass? no Loss of fat beneath skin? no Localized or general edema? no Diminished functional status? no  Other pertinent exam: HEENT:  normocephalic, sclerae anicteric, TMs pearly, nares patent, no discharge or erythema, pharynx normal Oral cavity: MMM, no lesions Neck: supple, no lymphadenopathy, no thyromegaly, no masses, no bruits Heart: RRR, 2 out of 6 holosystolic murmur heard best in right upper sternal border,  normal S1, S2 Lungs: CTA bilaterally, no wheezes, rhonchi, or rales Abdomen: +bs, soft, non tender, non distended, no masses, no hepatomegaly, no splenomegaly Musculoskeletal: left hand surgical scar, nontender, no swelling, no obvious deformity Extremities: no edema, no cyanosis, no clubbing Pulses: 2+ symmetric, upper and lower extremities, normal cap refill Neurological: alert, oriented x 3, CN2-12 intact, strength normal upper extremities and lower extremities, sensation normal throughout, DTRs 2+ throughout, no cerebellar signs, gait normal Psychiatric: normal affect, behavior normal, pleasant  GU: Normal male, circumcised, no mass or lymphadenopathy Anus normal appearing, no obvious lesions  Assessment:   Encounter Diagnoses  Name Primary?  . Essential hypertension, benign Yes  . Medicare annual wellness visit, subsequent   . Stenosis of carotid artery, unspecified laterality   . Aortic valve stenosis, etiology of cardiac valve disease unspecified   . Atherosclerosis of both carotid arteries   . Multinodular thyroid   . DDD (degenerative disc disease), cervical   . Dyslipidemia   . Screening for cancer   . Seborrheic keratosis   . Screening for prostate cancer      Plan:   A preventative services visit was completed today.  During the course of the visit today, we discussed and counseled about appropriate screening and preventive services.  A health risk assessment was established today that included a review of current medications, allergies, social history, family history, medical and preventative health history,  biometrics, and preventative screenings to identify potential safety concerns or  impairments.  A personalized plan was printed today for your records and use.   Personalized health advice and education was given today to reduce health risks and promote self management and wellness.  Information regarding end of life planning was discussed today.   I reviewed his 02/14/2018 echocardiogram through cardiology notes.  Normal LV systolic function, grade 2 diastolic dysfunction, mild aortic stenosis.  Thyroid ultrasound 08/2017 shows 1.3 cm right inferior thyroid nodule, it was recommended to have repeat ultrasound in 1 year.  We discussed this.  I reviewed 01/2017 lumbar spine MRI showing diffuse mild to moderate disc narrowing and bulging, facet arthropathy L5-S1   Conditions/risks identified: No worrisome problems  Chronic problems discussed today: HTN - c/t same medication  Atherosclerosis/vascular disease  - labs today, c/t statin  Carotid stenosis- reviewed September/2017 carotid ultrasound showing mild atherosclerotic disease in the carotids, less than 50% stenosis  chornic back pain - advised daily stretching and regular exercise routine  Moderate ear cerumen right.  Consider returning for lavage  Thyroid nodule - plan repeat ultrasound 09/2018  We discussed his questions and concerns regarding anal warts, versus skin tags.  We also discussed his seborrheic keratoses of his back.  Advise routine follow-up with dermatology.  Recommendations:  I recommend a yearly ophthalmology/optometry visit for glaucoma screening and eye checkup  I recommended a yearly dental visit for hygiene and checkup  Advanced directives - discussed nature and purpose of Advanced Directives, encouraged them to complete them if they have not done so and/or encouraged them to get [place] a copy if they have done this already.   Referrals today: none  Immunizations: I recommended a yearly influenza vaccine, typically in September when the vaccine is usually available Vaccines  up-to-date   Medicare Attestation A preventative services visit was completed today.  During the course of the visit the patient was educated and counseled about appropriate screening and preventive services.  A health risk assessment was established with the patient that included a review of current medications, allergies, social history, family history, medical and preventative health history, biometrics, and preventative screenings to identify potential safety concerns or impairments.  A personalized plan was printed today for the patient's records and use.   Personalized health advice and education was given today to reduce health risks and promote self management and wellness.  Information regarding end of life planning was discussed today.  Dorothea Ogle, PA-C   05/07/2018

## 2018-05-08 LAB — COMPREHENSIVE METABOLIC PANEL
ALT: 38 IU/L (ref 0–44)
AST: 29 IU/L (ref 0–40)
Albumin/Globulin Ratio: 2 (ref 1.2–2.2)
Albumin: 4.7 g/dL (ref 3.5–4.8)
Alkaline Phosphatase: 49 IU/L (ref 39–117)
BILIRUBIN TOTAL: 0.5 mg/dL (ref 0.0–1.2)
BUN/Creatinine Ratio: 13 (ref 10–24)
BUN: 17 mg/dL (ref 8–27)
CALCIUM: 9.5 mg/dL (ref 8.6–10.2)
CHLORIDE: 101 mmol/L (ref 96–106)
CO2: 25 mmol/L (ref 20–29)
Creatinine, Ser: 1.31 mg/dL — ABNORMAL HIGH (ref 0.76–1.27)
GFR calc non Af Amer: 55 mL/min/{1.73_m2} — ABNORMAL LOW (ref >59)
GFR, EST AFRICAN AMERICAN: 63 mL/min/{1.73_m2} (ref >59)
GLUCOSE: 92 mg/dL (ref 65–99)
Globulin, Total: 2.3 g/dL (ref 1.5–4.5)
Potassium: 4.1 mmol/L (ref 3.5–5.2)
Sodium: 141 mmol/L (ref 134–144)
Total Protein: 7 g/dL (ref 6.0–8.5)

## 2018-05-08 LAB — HEMOGLOBIN A1C
ESTIMATED AVERAGE GLUCOSE: 120 mg/dL
HEMOGLOBIN A1C: 5.8 % — AB (ref 4.8–5.6)

## 2018-05-08 LAB — CBC
HEMATOCRIT: 48.1 % (ref 37.5–51.0)
HEMOGLOBIN: 16.2 g/dL (ref 13.0–17.7)
MCH: 30.6 pg (ref 26.6–33.0)
MCHC: 33.7 g/dL (ref 31.5–35.7)
MCV: 91 fL (ref 79–97)
Platelets: 139 10*3/uL — ABNORMAL LOW (ref 150–450)
RBC: 5.29 x10E6/uL (ref 4.14–5.80)
RDW: 14.4 % (ref 12.3–15.4)
WBC: 6.7 10*3/uL (ref 3.4–10.8)

## 2018-05-08 LAB — LIPID PANEL
CHOLESTEROL TOTAL: 152 mg/dL (ref 100–199)
Chol/HDL Ratio: 3.5 ratio (ref 0.0–5.0)
HDL: 43 mg/dL (ref >39)
LDL Calculated: 86 mg/dL (ref 0–99)
TRIGLYCERIDES: 114 mg/dL (ref 0–149)
VLDL CHOLESTEROL CAL: 23 mg/dL (ref 5–40)

## 2018-05-08 LAB — PSA: Prostate Specific Ag, Serum: 2.7 ng/mL (ref 0.0–4.0)

## 2018-05-09 ENCOUNTER — Other Ambulatory Visit: Payer: Self-pay | Admitting: Medical

## 2018-05-09 DIAGNOSIS — E785 Hyperlipidemia, unspecified: Secondary | ICD-10-CM

## 2018-05-09 DIAGNOSIS — I10 Essential (primary) hypertension: Secondary | ICD-10-CM

## 2018-05-09 MED ORDER — ASPIRIN 81 MG PO TABS: 81.0000 mg | ORAL_TABLET | Freq: Every day | ORAL | 3 refills | Status: DC

## 2018-05-09 MED ORDER — ROSUVASTATIN CALCIUM 40 MG PO TABS: 40.0000 mg | ORAL_TABLET | Freq: Every day | ORAL | 3 refills | Status: DC

## 2018-05-09 MED ORDER — EZETIMIBE 10 MG PO TABS: 10.0000 mg | ORAL_TABLET | Freq: Every day | ORAL | 3 refills | Status: DC

## 2018-05-09 MED ORDER — METOPROLOL SUCCINATE ER 50 MG PO TB24: ORAL_TABLET | ORAL | 3 refills | Status: DC

## 2018-05-11 ENCOUNTER — Other Ambulatory Visit (INDEPENDENT_AMBULATORY_CARE_PROVIDER_SITE_OTHER): Payer: Medicare Other

## 2018-05-11 DIAGNOSIS — Z1211 Encounter for screening for malignant neoplasm of colon: Secondary | ICD-10-CM

## 2018-05-11 LAB — HEMOCCULT GUIAC POC 1CARD (OFFICE)
FECAL OCCULT BLD: NEGATIVE
FECAL OCCULT BLD: NEGATIVE
Fecal Occult Blood, POC: NEGATIVE

## 2018-05-29 ENCOUNTER — Other Ambulatory Visit: Payer: Self-pay | Admitting: Medical

## 2018-05-29 DIAGNOSIS — I10 Essential (primary) hypertension: Secondary | ICD-10-CM

## 2018-05-29 NOTE — Telephone Encounter (Signed)
Please call [facility] to inquire about why they are sending me this refill request?  I just refilled his metoprolol last month for 90-day supply +3 refills, so I do not understand why I would get a 30-day supply refill now?  This creates confusion and unnecessary work for [place] to have to go back and review the chart

## 2018-05-29 NOTE — Telephone Encounter (Signed)
Is this ok to refill?  It was refilled for 30 days on 05-09-18.  I tried to call patient and his voicemail states that he is not taking calls at this time.

## 2018-06-26 ENCOUNTER — Other Ambulatory Visit: Payer: Self-pay | Admitting: Dermatology

## 2018-09-20 ENCOUNTER — Ambulatory Visit
Admission: RE | Admit: 2018-09-20 | Discharge: 2018-09-20 | Disposition: A | Discharge status: Home / Self Care | Payer: Medicare Other | Source: Ambulatory Visit | Attending: Family Medicine | Admitting: Family Medicine

## 2018-09-20 DIAGNOSIS — E042 Nontoxic multinodular goiter: Secondary | ICD-10-CM

## 2018-10-11 ENCOUNTER — Encounter: Payer: Self-pay | Admitting: Family Medicine

## 2018-10-11 ENCOUNTER — Ambulatory Visit (INDEPENDENT_AMBULATORY_CARE_PROVIDER_SITE_OTHER): Payer: Medicare Other | Admitting: Family Medicine

## 2018-10-11 VITALS — BP 142/90 | HR 65 | Temp 97.8°F | Wt 215.4 lb

## 2018-10-11 DIAGNOSIS — S8011XA Contusion of right lower leg, initial encounter: Secondary | ICD-10-CM

## 2018-10-11 DIAGNOSIS — M545 Low back pain, unspecified: Secondary | ICD-10-CM

## 2018-10-11 MED ORDER — TRAMADOL HCL 50 MG PO TABS: 100.0000 mg | ORAL_TABLET | Freq: Three times a day (TID) | ORAL | 0 refills | Status: DC | PRN

## 2018-10-11 NOTE — Progress Notes (Signed)
   Subjective:    Patient ID: Daniel [name], male    DOB: [birth year], 72 y.o.   MRN: [id number]  HPI Approximately 3 weeks ago he was hit in the right lateral calf area when his dog's head hit him there.  He caused extensive swelling and discoloration going down into the ankle.  Subsequently he has noted a knot in that area. He then described an injury that occurred 1 year and a half ago when apparently he was jumping over a fence while at work.  He experienced a lot of left-sided pain and was seen in the [employer] claim by Dr. French Ana.  He was given physical therapy, medications and eventually had an epidural.  He was also given Lyrica for the foot tingling.  3 weeks ago apparently the back got much worse.  He is now scheduled for an epidural but is still having pain.  The injection is 1 week from today.  He has been using meloxicam and Tylenol without relief.   Review of Systems     Objective:   Physical Exam Alert and complaining of pain.  Some splinting noted when he was moving.  3 cm round smooth nontender lesion is noted in the right lateral calf area.       Assessment & Plan:  Hematoma of lower limb, right, initial encounter  Low back pain, unspecified back pain laterality, unspecified chronicity, unspecified whether sciatica present Recommend heat 20 minutes 3 times per day for the hematoma.  Explained that he was not in any danger. Tramadol called in for the leg pain.  He has taken oxycodone in the past which did cause some GI irritation explained that he might develop this again and let me know.

## 2018-10-11 NOTE — Patient Instructions (Signed)
Heat for 20 minutes 3 times a day to help get rid of that hematoma

## 2019-01-16 ENCOUNTER — Encounter: Payer: Self-pay | Admitting: Internal Medicine

## 2019-02-20 ENCOUNTER — Telehealth: Payer: Self-pay | Admitting: Medical

## 2019-02-20 DIAGNOSIS — M545 Low back pain, unspecified: Secondary | ICD-10-CM

## 2019-02-20 MED ORDER — TRAMADOL HCL 50 MG PO TABS: 100.0000 mg | ORAL_TABLET | Freq: Three times a day (TID) | ORAL | 0 refills | Status: DC | PRN

## 2019-02-20 NOTE — Telephone Encounter (Signed)
Pt called and wanted to know if he could get a refill on the Tramadol that u prescribed for his back pain. He uses the [facility] on [address]

## 2019-03-28 ENCOUNTER — Other Ambulatory Visit: Payer: Self-pay

## 2019-03-28 ENCOUNTER — Other Ambulatory Visit: Payer: Self-pay | Admitting: Medical

## 2019-03-28 ENCOUNTER — Encounter: Payer: Self-pay | Admitting: Medical

## 2019-03-28 ENCOUNTER — Ambulatory Visit (INDEPENDENT_AMBULATORY_CARE_PROVIDER_SITE_OTHER): Payer: Medicare Other | Admitting: Medical

## 2019-03-28 VITALS — BP 146/89 | Wt 207.0 lb

## 2019-03-28 DIAGNOSIS — I6523 Occlusion and stenosis of bilateral carotid arteries: Secondary | ICD-10-CM

## 2019-03-28 DIAGNOSIS — E785 Hyperlipidemia, unspecified: Secondary | ICD-10-CM | POA: Diagnosis not present

## 2019-03-28 DIAGNOSIS — I10 Essential (primary) hypertension: Secondary | ICD-10-CM | POA: Diagnosis not present

## 2019-03-28 MED ORDER — HYDROCHLOROTHIAZIDE 12.5 MG PO TABS: 12.5000 mg | ORAL_TABLET | Freq: Every day | ORAL | 1 refills | Status: DC

## 2019-03-28 NOTE — Progress Notes (Signed)
Subjective:     Patient ID: Daniel [name], male   DOB: 02/08/1946, 72 y.o.   MRN: [id number]  This visit type was conducted due to national recommendations for restrictions regarding the COVID-19 Pandemic (e.g. social distancing) in an effort to limit this patient's exposure and mitigate transmission in our community.  Due to their co-morbid illnesses, this patient is at least at moderate risk for complications without adequate follow up.  This format is felt to be most appropriate for this patient at this time.    Documentation for virtual audio and video telecommunications through Zoom encounter:  The patient was located at home. The provider was located in the office. The patient did consent to this visit and is aware of possible charges through their insurance for this visit.  The other persons participating in this telemedicine service were none. Time spent on call was 20 minutes and in review of previous records >25 minutes total.  This virtual service is not related to other E/M service within previous 7 days.   HPI Chief Complaint  Patient presents with  . bp running elevated    bp running around 140-150/ 87-89.    Virtual visit today for blood pressures running elevated recently.  He has a history of hypertension, dyslipidemia, and is compliant with Toprol-XL 50 mg daily.  He is compliant with Crestor 40 mg 1/2 tablet every other day, Zetia 10mg  daily, and aspirin 81 mg daily.  Wife will be 44 in July. Her BPs have suddenly jumped up as well.   This got him checking his BPs as well, and he has been findings elevated numbers, 140-150s SBP, 90s DBP for past few weeks.  Been walking for exercise, weight is stable.    No chest pain, no SOB.  Has had slight ankle swelling at times, but not currently.  He attributed this to some salty ham.  No DOE, no fatigue.  But has also had few readings there were in the 115/80s. doesn't eat fast food, eats a lot of fruit, vegetables, chicken.     Doesn't feel stressed other than listening to news about coronavirus.   Retired.  Has degenerative disc disease, low back pain, been dealing with workers comp thel last 2 years, so does more walking, not so much weight training or other exercise.    Uses diclofenac periodically for his back pain, but not daily use.  No other aggravating or relieving factors. No other complaint.   Past Medical History:  Diagnosis Date  . Abdominal pain    with coffee  . Abdominal ultrasound, abnormal 12/09/09   fatty liver disease  . Abnormal carotid ultrasound 03/01/07   R no ICA stenosis. 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 Allergy    allergy shots, cat allergy; Dr. Mosetta Anis;  . Arthritis   . Diverticulosis   . Dyslipidemia   . Fatty liver   . H/O CT scan of abdomen 03/20/12   abdomen/pelvis - normal  . H/O CT scan of chest 03/20/12   normal  . H/O diagnostic ultrasound 03/06/11   Aortic ultrasound - no AAA identified  . H/O echocardiogram 10/18/2011   transthoracic - mild concentric LVH, EF 55%, mild valvular aortic stenosis, LA mild to moderate dilation, otherwise normal echo; Dr. Rollene Fare  . Heart murmur, aortic    cardiac eval 09/2011, Dr. Gwenlyn [facility]), mild aortic stenosis  . Hypertension   . Inguinal hernia 2012   right  . Normal cardiac stress test 03/02/07  Myoview, EF 65%; Dr. Golden Hurter  . Statin intolerance    intolerance with Lipitor  . Testicular pain 01/08/10   Urology eval, Dr. Alinda Money   Current Outpatient Medications on File Prior to Visit  Medication Sig Dispense Refill  . aspirin 81 MG tablet Take 1 tablet (81 mg total) by mouth daily. 90 tablet 3  . diazepam (VALIUM) 10 MG tablet 1/2- 1 tablet po prn up to BID short term 15 tablet 0  . diclofenac (VOLTAREN) 75 MG EC tablet     . ezetimibe (ZETIA) 10 MG tablet Take 1 tablet (10 mg total) by mouth daily. 90 tablet 3  . fish oil-omega-3 fatty acids 1000 MG capsule Take 1 g by mouth daily.      .  metoprolol succinate (TOPROL-XL) 50 MG 24 hr tablet TAKE 1 TABLET BY MOUTH DAILY WITH OR IMMEDIATELY FOLLOWING A MEAL 90 tablet 3  . Multiple Vitamin (MULTIVITAMIN) tablet Take 1 tablet by mouth daily.      . pregabalin (LYRICA) 75 MG capsule Take 75 mg by mouth daily.    . rosuvastatin (CRESTOR) 40 MG tablet Take 1 tablet (40 mg total) by mouth daily. 90 tablet 3  . tiZANidine (ZANAFLEX) 4 MG tablet TK 1/2 TO 1 T PO TID PRF SPASMS    . carisoprodol (SOMA) 350 MG tablet Take 1 tablet (350 mg total) by mouth 4 (four) times daily as needed for muscle spasms. (Patient not taking: Reported on 03/28/2019) 20 tablet 0   No current facility-administered medications on file prior to visit.      Review of Systems As in subjective      Objective:   Physical Exam  BP (!) 146/89   Wt 207 lb (93.9 kg)   BMI 28.87 kg/m  Wt Readings from Last 3 Encounters:  03/28/19 207 lb (93.9 kg)  10/11/18 215 lb 6.4 oz (97.7 kg)  05/07/18 208 lb 3.2 oz (94.4 kg)   BP Readings from Last 3 Encounters:  03/28/19 (!) 146/89  10/11/18 (!) 142/90  05/07/18 140/88   Due to coronavirus pandemic stay at home measures, patient visit was virtual and they were not examined in person.   General: Well-developed, well-nourished, no acute distress     Assessment:     Encounter Diagnoses  Name Primary?  . Essential hypertension, benign Yes  . Atherosclerosis of both carotid arteries   . Dyslipidemia        Plan:     Discussed the limitations of virtual visit.  We discussed healthy diet, limiting salt intake, continuing regular walking for exercise, glad to hear he is doing a lot of home cooking and not eating out.  He is compliant with his current medications.  For blood pressure he is compliant with Toprol-XL 50 mg daily.  We will add HCTZ low-dose diuretic.  Discussed risk and benefits of medication.  He will use an over-the-counter potassium supplement once daily for now.  Discussed importance of hydration  and not getting dehydrated on this medication.  Advised he monitor his blood pressures.  Goal would be less than 130/80.  We will plan to see him in here for a physical in mid June which is his normal time to come in for physical anyway.  Limit NSAIDs.  Decreased rest when possible.  Avoid listen to the 24-hour new cycles.  Advised if any new changes, side effects, other symptoms in the meantime to call back.  Also discussed coronavirus, precautions, trying to stay safe and not  get exposed.  Answered his questions.  Ryett was seen today for bp running elevated.  Diagnoses and all orders for this visit:  Essential hypertension, benign  Atherosclerosis of both carotid arteries  Dyslipidemia  Other orders -     hydrochlorothiazide (HYDRODIURIL) 12.5 MG tablet; Take 1 tablet (12.5 mg total) by mouth daily.

## 2019-04-01 ENCOUNTER — Telehealth: Payer: Self-pay | Admitting: Family Medicine

## 2019-04-01 NOTE — Telephone Encounter (Signed)
Get more information concerning what his blood pressure is.

## 2019-04-01 NOTE — Telephone Encounter (Signed)
Have him come in for a real visit and bring his blood pressure cuff as well as the blood pressure readings

## 2019-04-01 NOTE — Telephone Encounter (Signed)
Patient notified of recommendations. 

## 2019-04-01 NOTE — Telephone Encounter (Signed)
He has only been on this medicine a few days.  I recommend he continue the same plan, same changes we discussed at his recent virtual visit, monitor blood pressure 3 or 4 times per week, not every day and not multiple times per day.  If he wants to give me a call back in 2 weeks with a blood pressure update that is fine.  Continue same plan for physical visit in June

## 2019-04-01 NOTE — Telephone Encounter (Signed)
PT called and states his upper number of his bp is dropping, however his bottom number is statying 88, 90.  Do you want him to double up on the HCTZ for a few days?  Pt ph [phone]

## 2019-04-01 NOTE — Telephone Encounter (Signed)
Please advise on increase of HCTZ. Pt is concerned about his diastolic number still being higher than normal. Thanks [facility]

## 2019-04-02 ENCOUNTER — Telehealth: Payer: Self-pay | Admitting: Family Medicine

## 2019-04-02 NOTE — Telephone Encounter (Signed)
Pt called with BP readings very concerned  142/90 this morning,  took medicine hour later 156/90 hour.  Little while ago 164/97, 5 min ago 150/98.  Michela Pitcher keeps going up.  Please call pt and advise

## 2019-04-03 ENCOUNTER — Telehealth: Payer: Self-pay | Admitting: Medical

## 2019-04-03 NOTE — Telephone Encounter (Signed)
[facility]  Sent over a drug change stating that pt is now taking crestor 40 mg with new directions -one half tablet every other day, wants to change the the crestor 20 mg every other day, so the directions match how he is actually to take them. Please send to [place], [address] AT [address]

## 2019-04-04 NOTE — Telephone Encounter (Signed)
Called pharmacy  back to clarify she was not aware of any change of his medicine states she would send over new rx if she found anything

## 2019-04-04 NOTE — Telephone Encounter (Signed)
Please clarify, I need to send Crestor 40mg , 1 tablet every other day?  I was a little confused with the message.

## 2019-04-18 NOTE — Telephone Encounter (Signed)
Called pt and he states BP doing good now, reading today 120/82.  He will call back if any other issues

## 2019-05-15 ENCOUNTER — Other Ambulatory Visit: Payer: Self-pay

## 2019-05-15 ENCOUNTER — Ambulatory Visit (INDEPENDENT_AMBULATORY_CARE_PROVIDER_SITE_OTHER): Payer: Medicare Other | Admitting: Medical

## 2019-05-15 ENCOUNTER — Encounter: Payer: Self-pay | Admitting: Medical

## 2019-05-15 VITALS — BP 120/86 | HR 63 | Temp 98.0°F | Ht 71.0 in | Wt 210.8 lb

## 2019-05-15 DIAGNOSIS — M5030 Other cervical disc degeneration, unspecified cervical region: Secondary | ICD-10-CM

## 2019-05-15 DIAGNOSIS — K760 Fatty (change of) liver, not elsewhere classified: Secondary | ICD-10-CM

## 2019-05-15 DIAGNOSIS — Z0000 Encounter for general adult medical examination without abnormal findings: Secondary | ICD-10-CM

## 2019-05-15 DIAGNOSIS — Z129 Encounter for screening for malignant neoplasm, site unspecified: Secondary | ICD-10-CM

## 2019-05-15 DIAGNOSIS — E785 Hyperlipidemia, unspecified: Secondary | ICD-10-CM

## 2019-05-15 DIAGNOSIS — E042 Nontoxic multinodular goiter: Secondary | ICD-10-CM

## 2019-05-15 DIAGNOSIS — I6529 Occlusion and stenosis of unspecified carotid artery: Secondary | ICD-10-CM

## 2019-05-15 DIAGNOSIS — L989 Disorder of the skin and subcutaneous tissue, unspecified: Secondary | ICD-10-CM | POA: Insufficient documentation

## 2019-05-15 DIAGNOSIS — I350 Nonrheumatic aortic (valve) stenosis: Secondary | ICD-10-CM

## 2019-05-15 DIAGNOSIS — L821 Other seborrheic keratosis: Secondary | ICD-10-CM

## 2019-05-15 DIAGNOSIS — G5790 Unspecified mononeuropathy of unspecified lower limb: Secondary | ICD-10-CM | POA: Insufficient documentation

## 2019-05-15 DIAGNOSIS — N400 Enlarged prostate without lower urinary tract symptoms: Secondary | ICD-10-CM | POA: Insufficient documentation

## 2019-05-15 DIAGNOSIS — R7301 Impaired fasting glucose: Secondary | ICD-10-CM | POA: Insufficient documentation

## 2019-05-15 DIAGNOSIS — M62838 Other muscle spasm: Secondary | ICD-10-CM | POA: Insufficient documentation

## 2019-05-15 DIAGNOSIS — I6523 Occlusion and stenosis of bilateral carotid arteries: Secondary | ICD-10-CM

## 2019-05-15 DIAGNOSIS — Z7189 Other specified counseling: Secondary | ICD-10-CM | POA: Insufficient documentation

## 2019-05-15 DIAGNOSIS — I10 Essential (primary) hypertension: Secondary | ICD-10-CM

## 2019-05-15 DIAGNOSIS — M792 Neuralgia and neuritis, unspecified: Secondary | ICD-10-CM | POA: Insufficient documentation

## 2019-05-15 DIAGNOSIS — N289 Disorder of kidney and ureter, unspecified: Secondary | ICD-10-CM | POA: Insufficient documentation

## 2019-05-15 DIAGNOSIS — Z7185 Encounter for immunization safety counseling: Secondary | ICD-10-CM | POA: Insufficient documentation

## 2019-05-15 LAB — LIPID PANEL

## 2019-05-15 NOTE — Progress Notes (Signed)
Subjective:    Daniel [name] is a 73 y.o. male who presents for Preventative Services visit and chronic medical problems/med check visit.    Primary Care Provider Tysinger, Camelia Eng, PA-C here for primary care  Current Health Care Team:  Dr. Carlye Grippe, dentist  Dr. Nicki Reaper, eye doctor  Dr. Quay Burow, cardiology  Dr. [name], GI  [facility]  Medical Services you may have received from other than [facility] providers in the past year (date may be approximate) [facility]  Exercise Current exercise habits: walking his lab/german shepherd mix  Nutrition/Diet Current diet: healthy  Depression Screen Depression screen [facility] 2/9 05/15/2019  Decreased Interest 0  Down, Depressed, Hopeless 0  PHQ - 2 Score 0    Activities of Daily Living Screen/Functional Status Survey Is the patient deaf or have difficulty hearing?: No Does the patient have difficulty seeing, even when wearing glasses/contacts?: No Does the patient have difficulty concentrating, remembering, or making decisions?: No Does the patient have difficulty walking or climbing stairs?: No Does the patient have difficulty dressing or bathing?: No Does the patient have difficulty doing errands alone such as visiting a doctor's office or shopping?: No  Can patient draw a clock face showing 3:15 oclock, yes  Fall Risk Screen Fall Risk  05/15/2019 05/07/2018 09/11/2017 07/30/2014  Falls in the past year? 0 No No No    Gait Assessment: Normal gait observed yes  Advanced directives Does patient have a [place]? no Does patient have a Living Will? no  Past Medical History:  Diagnosis Date  . Abdominal pain    with coffee  . Abdominal ultrasound, abnormal 12/09/09   fatty liver disease  . Abnormal carotid ultrasound 03/01/07   R no ICA stenosis. 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 Allergy    allergy shots, cat allergy; Dr. Mosetta Anis;  . Arthritis   . Diverticulosis   .  Dyslipidemia   . Fatty liver   . H/O CT scan of abdomen 03/20/12   abdomen/pelvis - normal  . H/O CT scan of chest 03/20/12   normal  . H/O diagnostic ultrasound 03/06/11   Aortic ultrasound - no AAA identified  . H/O echocardiogram 10/18/2011   transthoracic - mild concentric LVH, EF 55%, mild valvular aortic stenosis, LA mild to moderate dilation, otherwise normal echo; Dr. Rollene Fare  . Heart murmur, aortic    cardiac eval 09/2011, Dr. Gwenlyn Found [facility]), mild aortic stenosis  . Hypertension   . Inguinal hernia 2012   right  . Normal cardiac stress test 03/02/07   Myoview, EF 65%; Dr. Golden Hurter  . Statin intolerance    intolerance with Lipitor  . Testicular pain 01/08/10   Urology eval, Dr. Alinda Money    Past Surgical History:  Procedure Laterality Date  . COLONOSCOPY  10/21/2008   internal hemorrhoids, diverticulosis; repeat 10/2018; Dr. Scarlette Shorts, [place]  . HAND SURGERY  2004   left ganglion cyst removed   . NOSE SURGERY  2005   obstruction, polyp and deviated septum; Dr. Redmond Baseman  . SHOULDER SURGERY  2002, 2008   bilat, RTC    Social History   Socioeconomic History  . Marital status: Married    Spouse name: Not on file  . Number of children: Not on file  . Years of education: Not on file  . Highest education level: Not on file  Occupational History  . Occupation: Presenter, broadcasting; part time [employer]    Employer: [employer]  Social Needs  . Financial resource strain: Not on file  . Food insecurity    Worry: Not on file    Inability: Not on file  . Transportation needs    Medical: Not on file    Non-medical: Not on file  Tobacco Use  . Smoking status: Never Smoker  . Smokeless tobacco: Never Used  Substance and Sexual Activity  . Alcohol use: Yes    Alcohol/week: 2.0 standard drinks    Types: 2 Cans of beer per week    Comment: beer once or twice per week  . Drug use: No  . Sexual activity: Yes  Lifestyle  . Physical activity    Days per week: Not on  file    Minutes per session: Not on file  . Stress: Not on file  Relationships  . Social Herbalist on phone: Not on file    Gets together: Not on file    Attends religious service: Not on file    Active member of club or organization: Not on file    Attends meetings of clubs or organizations: Not on file    Relationship status: Not on file  . Intimate partner violence    Fear of current or ex partner: Not on file    Emotionally abused: Not on file    Physically abused: Not on file    Forced sexual activity: Not on file  Other Topics Concern  . Not on file  Social History Narrative   Married, no children, 2 dogs and a cat, exercise - aerobics, some weight bearing    Family History  Problem Relation Age of Onset  . Cancer Mother        breast  . Dementia Mother 23  . Kidney disease Father 30       kidney failure  . Rheum arthritis Father   . Diabetes Neg Hx   . Heart disease Neg Hx   . Stroke Neg Hx   . Hypertension Neg Hx   . Hyperlipidemia Neg Hx      Current Outpatient Medications:  .  diclofenac (VOLTAREN) 75 MG EC tablet, , Disp: , Rfl:  .  ezetimibe (ZETIA) 10 MG tablet, Take 1 tablet (10 mg total) by mouth daily., Disp: 90 tablet, Rfl: 3 .  hydrochlorothiazide (HYDRODIURIL) 12.5 MG tablet, TAKE 1 TABLET(12.5 MG) BY MOUTH DAILY, Disp: 90 tablet, Rfl: 0 .  metoprolol succinate (TOPROL-XL) 50 MG 24 hr tablet, TAKE 1 TABLET BY MOUTH DAILY WITH OR IMMEDIATELY FOLLOWING A MEAL, Disp: 90 tablet, Rfl: 3 .  Multiple Vitamin (MULTIVITAMIN) tablet, Take 1 tablet by mouth daily.  , Disp: , Rfl:  .  pregabalin (LYRICA) 75 MG capsule, Take 75 mg by mouth daily., Disp: , Rfl:  .  rosuvastatin (CRESTOR) 40 MG tablet, Take 1 tablet (40 mg total) by mouth daily., Disp: 90 tablet, Rfl: 3 .  tiZANidine (ZANAFLEX) 4 MG tablet, TK 1/2 TO 1 T PO TID PRF SPASMS, Disp: , Rfl:  .  aspirin 81 MG tablet, Take 1 tablet (81 mg total) by mouth daily. (Patient not taking: Reported on  05/15/2019), Disp: 90 tablet, Rfl: 3 .  carisoprodol (SOMA) 350 MG tablet, Take 1 tablet (350 mg total) by mouth 4 (four) times daily as needed for muscle spasms. (Patient not taking: Reported on 03/28/2019), Disp: 20 tablet, Rfl: 0 .  diazepam (VALIUM) 10 MG tablet, 1/2- 1 tablet po prn up to BID short term (Patient not taking: Reported  on 05/15/2019), Disp: 15 tablet, Rfl: 0 .  fish oil-omega-3 fatty acids 1000 MG capsule, Take 1 g by mouth daily.  , Disp: , Rfl:   Allergies  Allergen Reactions  . Codeine Nausea Only and Other (See Comments)    Dizziness, sweat and will make patient pass out.  . Ampicillin     Rash   . Lipitor [Atorvastatin]     Elevated LFTs, no symptoms  . Sulfa Antibiotics Rash    History reviewed: allergies, current medications, past family history, past medical history, past social history, past surgical history and problem list  Chronic issues discussed: He reports ongoing problems with chronic pains down his legs, pains in his feet, numbness and tingling and burning sensations in his feet.  The past year he sleeps on the couch.  He has to move positions throughout the night because of these pains.  He uses tizanidine, diclofenac and pregabalin occasionally but not daily.  These are prescribed by [facility].  He is compliant with his rosuvastatin for cholesterol, compliant with ezetimibe for cholesterol, compliant with metoprolol and hydrochlorothiazide for blood pressure, compliant with coenzyme Q 10  He reports some sharp pains in his scalp on top of his head intermittent lately  Acute issues discussed: no  Objective:      Biometrics BP 120/86   Pulse 63   Temp 98 F (36.7 C)   Ht 5\' 11"  (1.803 m)   Wt 210 lb 12.8 oz (95.6 kg)   SpO2 95%   BMI 29.40 kg/m   Cognitive Testing  Alert? Yes  Normal Appearance?Yes  Oriented to person? Yes  Place? Yes   Time? Yes  Recall of three objects?  Yes  Can perform simple calculations? Yes  Displays  appropriate judgment?Yes  Can read the correct time from a watch face?Yes  General appearance: alert, no distress, WD/WN, white male  Nutritional Status: Inadequate calore intake? no Loss of muscle mass? no Loss of fat beneath skin? no Localized or general edema? no Diminished functional status? no  Other pertinent exam: Skin: Numerous scattered cherry hemangiomas throughout torso anterior and posteriorly, one particular 4 mm x 3 mm lesion on the right upper back with multicolored pink and round coloration, no other worrisome lesion, prior surgical biopsy scars mid upper and mid lower back HEENT: normocephalic, sclerae anicteric, TMs pearly, nares patent, no discharge or erythema, pharynx normal Oral cavity: MMM, no lesions Neck: supple, no lymphadenopathy, no thyromegaly, no masses Heart: 2/6 holosystolic murmur heard best in upper sternal borders otherwise RRR, normal S1, S2 Lungs: CTA bilaterally, no wheezes, rhonchi, or rales Abdomen: +bs, soft, non tender, non distended, no masses, no hepatomegaly, no splenomegaly Musculoskeletal: nontender, no swelling, no obvious deformity Extremities: no edema, no cyanosis, no clubbing, mild varicosities bilaterally Pulses: 2+ symmetric, upper and lower extremities, normal cap refill Neurological: alert, oriented x 3, CN2-12 intact, strength normal upper extremities and lower extremities, sensation normal throughout, DTRs 2+ throughout, no cerebellar signs, gait normal Psychiatric: normal affect, behavior normal, pleasant  GU: Normal genitalia, no obvious hernia or mass Rectal deferred  Assessment:   Encounter Diagnoses  Name Primary?  . Essential hypertension, benign Yes  . Medicare annual wellness visit, subsequent   . Dyslipidemia   . DDD (degenerative disc disease), cervical   . Multinodular thyroid   . Aortic valve stenosis, etiology of cardiac valve disease unspecified   . Seborrheic keratosis   . Benign prostatic hyperplasia,  unspecified whether lower urinary tract symptoms present   .  Impaired fasting blood sugar   . Impaired renal function   . Stenosis of carotid artery, unspecified laterality   . Atherosclerosis of both carotid arteries   . Vaccine counseling   . Fatty liver   . Neuropathy of lower extremity, unspecified laterality   . Neuralgia   . Muscle spasm   . Screening for cancer   . Advanced directives, counseling/discussion   . Skin lesion      Plan:   A preventative services visit was completed today.  During the course of the visit today, we discussed and counseled about appropriate screening and preventive services.  A health risk assessment was established today that included a review of current medications, allergies, social history, family history, medical and preventative health history, biometrics, and preventative screenings to identify potential safety concerns or impairments.  A personalized plan was printed today for your records and use.   Personalized health advice and education was given today to reduce health risks and promote self management and wellness.  Information regarding end of life planning was discussed today.  Conditions/risks identified: Pain and neuropathy not controlled  Skin lesions  Chronic problems discussed today: Thyroid nodules-thyroid labs today, repeat thyroid ultrasound, and will likely need to do this yearly for surveillance the next 4 years to establish stability  Aortic stenosis-due back for yearly cardiology follow-up at this time  Fatty liver disease- continue to work on healthy diet and regular exercise  Hyperlipidemia-continue statin and Zetia, labs today  Hypertension-continue current medication, labs today  Chronic pain and neuropathy- not controlled, advised to follow-up with orthopedics, advised to start taking his pregabalin medication daily at bedtime will discuss with his orthopedics about better control of his symptoms  Skin  lesions-advised yearly follow-up with Derm, one particular lesion on the back needs evaluation  Consider updated carotid US.   Last one 2017.  He insisted on PSA screening today.  He does have enlarged prostate without significant symptoms.  We discussed the limited value of PSA screening at this age but he insists.  Discussed the pros and cons of testing at this age  He is up-to-date on colon cancer screening  Recommendations:  I recommend a yearly ophthalmology/optometry visit for glaucoma screening and eye checkup  I recommended a yearly dental visit for hygiene and checkup  Advanced directives - discussed nature and purpose of Advanced Directives, encouraged them to complete them if they have not done so and/or encouraged them to get [place] a copy if they have done this already.   Referrals today: Thyroid US   Immunizations: I recommended a yearly influenza vaccine, typically in September when the vaccine is usually available  He is up-to-date on pneumococcal 13 and pneumococcal 23 vaccine  He is up-to-date on tetanus vaccine  He is up-to-date on his first Shingrix but not the second Shingrix.  Advised to check insurance coverage for this and return for #2 Shingrix.   [place] was seen today for [employer].  Diagnoses and all orders for this visit:  Essential hypertension, benign -     Comprehensive metabolic panel -     CBC  Medicare annual wellness visit, subsequent  Dyslipidemia -     Comprehensive metabolic panel -     Lipid panel  DDD (degenerative disc disease), cervical  Multinodular thyroid -     TSH -     T4, free -     US Soft Tissue Head/Neck; Future  Aortic valve stenosis, etiology of cardiac valve disease unspecified  Seborrheic keratosis  Benign prostatic hyperplasia, unspecified whether lower urinary tract symptoms present -     PSA  Impaired fasting blood sugar -     Hemoglobin A1c  Impaired renal function  Stenosis of carotid  artery, unspecified laterality  Atherosclerosis of both carotid arteries  Vaccine counseling  Fatty liver  Neuropathy of lower extremity, unspecified laterality  Neuralgia  Muscle spasm  Screening for cancer  Advanced directives, counseling/discussion  Skin lesion     Medicare Attestation A preventative services visit was completed today.  During the course of the visit the patient was educated and counseled about appropriate screening and preventive services.  A health risk assessment was established with the patient that included a review of current medications, allergies, social history, family history, medical and preventative health history, biometrics, and preventative screenings to identify potential safety concerns or impairments.  A personalized plan was printed today for the patient's records and use.   Personalized health advice and education was given today to reduce health risks and promote self management and wellness.  Information regarding end of life planning was discussed today.  Dorothea Ogle, PA-C   05/15/2019

## 2019-05-16 ENCOUNTER — Other Ambulatory Visit: Payer: Self-pay | Admitting: Medical

## 2019-05-16 DIAGNOSIS — E785 Hyperlipidemia, unspecified: Secondary | ICD-10-CM

## 2019-05-16 DIAGNOSIS — I10 Essential (primary) hypertension: Secondary | ICD-10-CM

## 2019-05-16 LAB — TSH: TSH: 1.71 u[IU]/mL (ref 0.450–4.500)

## 2019-05-16 LAB — COMPREHENSIVE METABOLIC PANEL
ALT: 48 IU/L — ABNORMAL HIGH (ref 0–44)
AST: 36 IU/L (ref 0–40)
Albumin/Globulin Ratio: 1.9 (ref 1.2–2.2)
Albumin: 4.7 g/dL (ref 3.7–4.7)
Alkaline Phosphatase: 53 IU/L (ref 39–117)
BUN/Creatinine Ratio: 14 (ref 10–24)
BUN: 18 mg/dL (ref 8–27)
Bilirubin Total: 0.5 mg/dL (ref 0.0–1.2)
CO2: 27 mmol/L (ref 20–29)
Calcium: 9.9 mg/dL (ref 8.6–10.2)
Chloride: 103 mmol/L (ref 96–106)
Creatinine, Ser: 1.31 mg/dL — ABNORMAL HIGH (ref 0.76–1.27)
GFR calc Af Amer: 63 mL/min/{1.73_m2} (ref >59)
GFR calc non Af Amer: 54 mL/min/{1.73_m2} — ABNORMAL LOW (ref >59)
Globulin, Total: 2.5 g/dL (ref 1.5–4.5)
Glucose: 96 mg/dL (ref 65–99)
Potassium: 4.3 mmol/L (ref 3.5–5.2)
Sodium: 141 mmol/L (ref 134–144)
Total Protein: 7.2 g/dL (ref 6.0–8.5)

## 2019-05-16 LAB — LIPID PANEL
Chol/HDL Ratio: 3 ratio (ref 0.0–5.0)
Cholesterol, Total: 127 mg/dL (ref 100–199)
HDL: 42 mg/dL (ref >39)
LDL Calculated: 63 mg/dL (ref 0–99)
Triglycerides: 111 mg/dL (ref 0–149)
VLDL Cholesterol Cal: 22 mg/dL (ref 5–40)

## 2019-05-16 LAB — HEMOGLOBIN A1C
Est. average glucose Bld gHb Est-mCnc: 111 mg/dL
Hgb A1c MFr Bld: 5.5 % (ref 4.8–5.6)

## 2019-05-16 LAB — CBC
Hematocrit: 48.3 % (ref 37.5–51.0)
Hemoglobin: 16.5 g/dL (ref 13.0–17.7)
MCH: 31 pg (ref 26.6–33.0)
MCHC: 34.2 g/dL (ref 31.5–35.7)
MCV: 91 fL (ref 79–97)
Platelets: 122 10*3/uL — ABNORMAL LOW (ref 150–450)
RBC: 5.32 x10E6/uL (ref 4.14–5.80)
RDW: 13.3 % (ref 11.6–15.4)
WBC: 5.6 10*3/uL (ref 3.4–10.8)

## 2019-05-16 LAB — T4, FREE: Free T4: 1.03 ng/dL (ref 0.82–1.77)

## 2019-05-16 LAB — PSA: Prostate Specific Ag, Serum: 2.4 ng/mL (ref 0.0–4.0)

## 2019-05-16 MED ORDER — METOPROLOL SUCCINATE ER 50 MG PO TB24: ORAL_TABLET | ORAL | 3 refills | Status: DC

## 2019-05-16 MED ORDER — ASPIRIN EC 81 MG PO TBEC: 81.0000 mg | DELAYED_RELEASE_TABLET | Freq: Every day | ORAL | 3 refills | Status: DC

## 2019-05-16 MED ORDER — EZETIMIBE 10 MG PO TABS: 10.0000 mg | ORAL_TABLET | Freq: Every day | ORAL | 3 refills | Status: DC

## 2019-05-16 MED ORDER — ROSUVASTATIN CALCIUM 40 MG PO TABS: 40.0000 mg | ORAL_TABLET | Freq: Every day | ORAL | 3 refills | Status: DC

## 2019-05-16 MED ORDER — HYDROCHLOROTHIAZIDE 12.5 MG PO TABS: ORAL_TABLET | ORAL | 3 refills | Status: DC

## 2019-05-22 DIAGNOSIS — M549 Dorsalgia, unspecified: Secondary | ICD-10-CM | POA: Insufficient documentation

## 2019-05-23 DIAGNOSIS — M5416 Radiculopathy, lumbar region: Secondary | ICD-10-CM | POA: Insufficient documentation

## 2019-05-23 DIAGNOSIS — M48061 Spinal stenosis, lumbar region without neurogenic claudication: Secondary | ICD-10-CM | POA: Insufficient documentation

## 2019-06-11 ENCOUNTER — Other Ambulatory Visit: Payer: Self-pay

## 2019-06-11 ENCOUNTER — Ambulatory Visit
Admission: RE | Admit: 2019-06-11 | Discharge: 2019-06-11 | Disposition: A | Discharge status: Home / Self Care | Payer: Medicare Other | Source: Ambulatory Visit | Attending: Medical | Admitting: Medical

## 2019-06-11 DIAGNOSIS — E042 Nontoxic multinodular goiter: Secondary | ICD-10-CM

## 2019-06-12 ENCOUNTER — Other Ambulatory Visit: Payer: Self-pay | Admitting: Medical

## 2019-06-12 DIAGNOSIS — E041 Nontoxic single thyroid nodule: Secondary | ICD-10-CM

## 2019-06-12 NOTE — Progress Notes (Signed)
th

## 2019-06-22 ENCOUNTER — Other Ambulatory Visit: Payer: Self-pay | Admitting: Medical

## 2019-07-01 DIAGNOSIS — M419 Scoliosis, unspecified: Secondary | ICD-10-CM | POA: Insufficient documentation

## 2019-07-10 ENCOUNTER — Ambulatory Visit
Admission: RE | Admit: 2019-07-10 | Discharge: 2019-07-10 | Disposition: A | Discharge status: Home / Self Care | Payer: Medicare Other | Source: Ambulatory Visit | Attending: Medical | Admitting: Medical

## 2019-07-10 ENCOUNTER — Other Ambulatory Visit (HOSPITAL_COMMUNITY)
Admission: RE | Admit: 2019-07-10 | Discharge: 2019-07-10 | Disposition: A | Discharge status: Home / Self Care | Payer: Medicare Other | Source: Ambulatory Visit | Attending: Medical | Admitting: Medical

## 2019-07-10 DIAGNOSIS — E041 Nontoxic single thyroid nodule: Secondary | ICD-10-CM

## 2019-07-17 ENCOUNTER — Other Ambulatory Visit: Payer: Self-pay | Admitting: Dermatology

## 2019-07-24 ENCOUNTER — Telehealth: Payer: Self-pay | Admitting: Medical

## 2019-07-24 DIAGNOSIS — I6523 Occlusion and stenosis of bilateral carotid arteries: Secondary | ICD-10-CM

## 2019-07-24 NOTE — Telephone Encounter (Signed)
Pt called and states he has additional questions concerning his Thyroid US and Biopsy. Please call pt [phone].

## 2019-07-24 NOTE — Telephone Encounter (Signed)
Please call pt concerning recent Thyroid US and biopsy. He states he was able to see results on mychart but has questions. Pt can be reached at [phone].

## 2019-07-25 ENCOUNTER — Telehealth: Payer: Self-pay | Admitting: Family Medicine

## 2019-07-25 NOTE — Telephone Encounter (Signed)
Patient has been contacted by Audelia Acton.

## 2019-07-25 NOTE — Telephone Encounter (Signed)
Please call him and advise I will call him today.   I got the message yesterday, but with our schedule was unable to call today.   He mentioned in the email he never heard back from [place] after the initial result, but our notes showed we did call him.  Does he state he NEVER got a call at all regarding results from biopsy initially?

## 2019-07-25 NOTE — Telephone Encounter (Signed)
Pt called very concerned that he is not receiving a call back on his Thyroid results.  I advised pt he would hear back from [place] today [phone]

## 2019-07-25 NOTE — Telephone Encounter (Signed)
Patient was informed he would get a call about this today.

## 2019-07-26 NOTE — Telephone Encounter (Signed)
I called and spoke to him about recent thyroid ultrasounds and biopsy.   I will call back and speak to radiology about the nodules and follow up.

## 2019-08-01 NOTE — Telephone Encounter (Signed)
Please get me phone number for [place] regarding his thyroid biopsy.

## 2019-08-06 NOTE — Telephone Encounter (Signed)
[phone]- this is number to [place] whom may be able to get you to the bright peron you need to speak to

## 2019-08-08 NOTE — Telephone Encounter (Signed)
I have called and spoke to radiology about additional questions from his biopsy.  They will call me back

## 2019-08-14 NOTE — Telephone Encounter (Signed)
Please call the number you gave me last week in again asked the question for them to get back with us-whether or not he needed any other biopsies of the other nodules.  I had called and asked a question, was waiting for callback from the doctor that did the procedure but never got a call back.  They only biopsied one nodule although there were multiple nodules that were almost as big as a nodule they biopsied.

## 2019-08-23 IMAGING — US US THYROID
1 series · 12 of 25 positions shown · non-contrast
Comparison: 09/20/2018 and previous back to 08/22/2016

CLINICAL DATA: Multinodular goiter

EXAM:
THYROID ULTRASOUND
TECHNIQUE: Ultrasound examination of the thyroid gland and adjacent soft
tissues was performed.

[Series 1: us thyroid · 0.04mm/px · 12 of 53 slices shown]
[im 3/53]
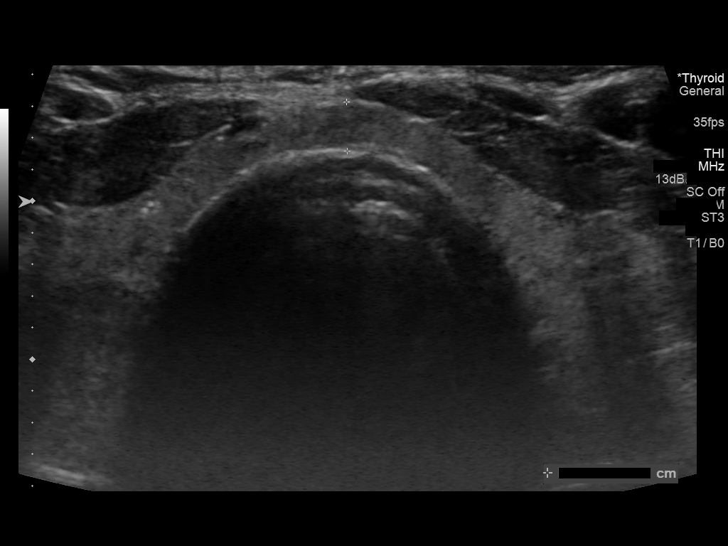
[im 7/53]
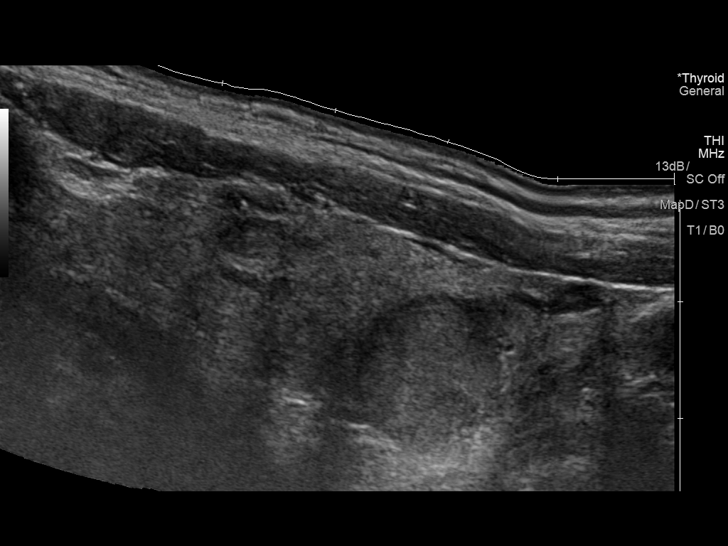
[im 11/53]
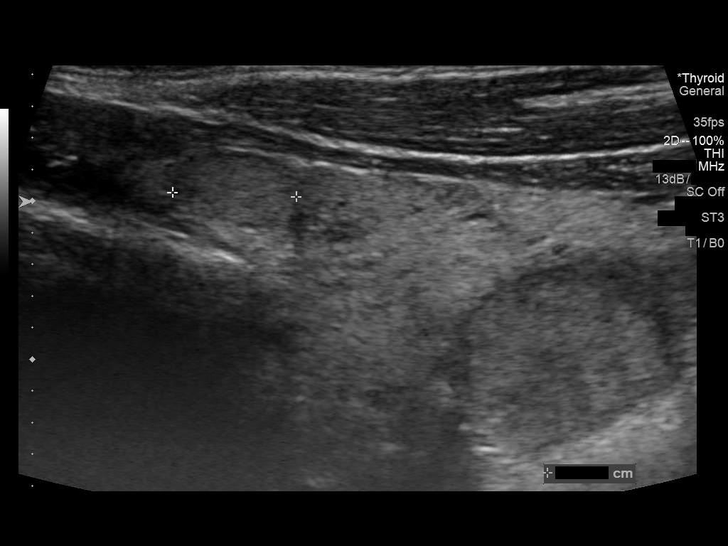
[im 16/53]
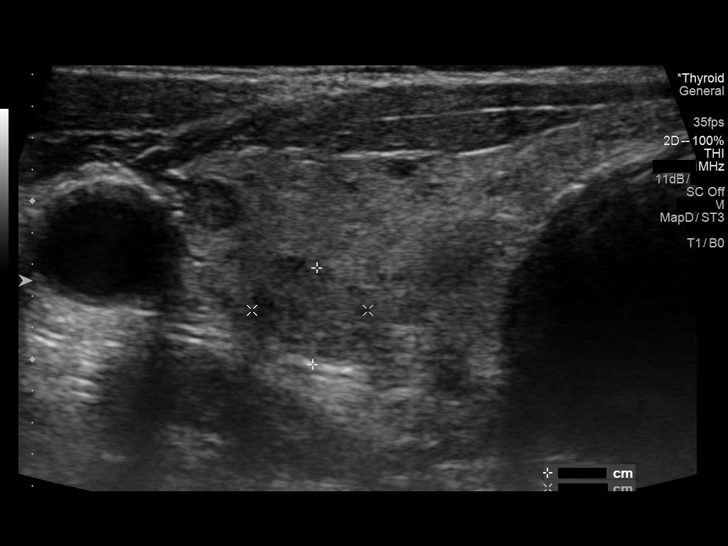
[im 20/53]
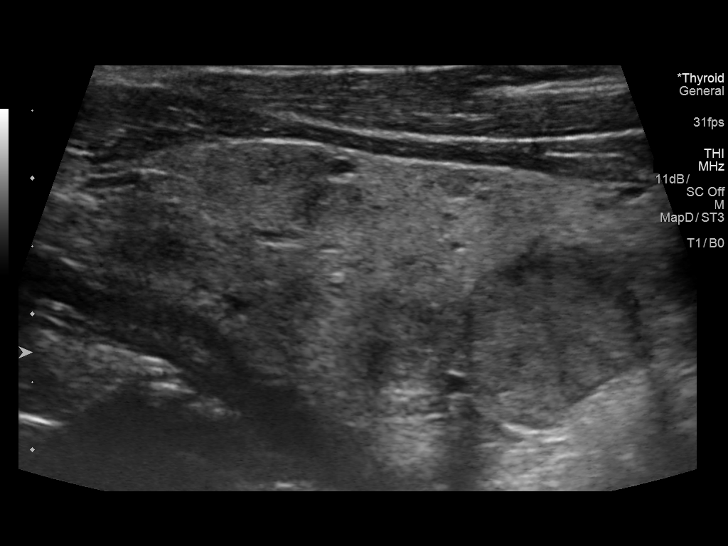
[im 24/53]
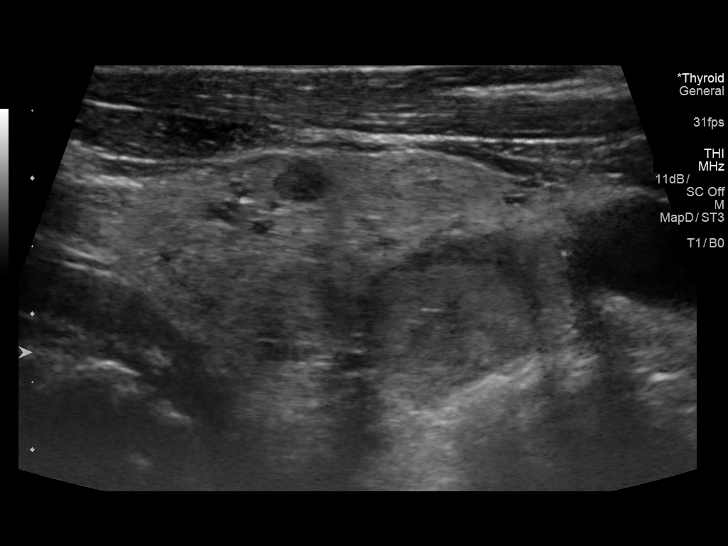
[im 29/53]
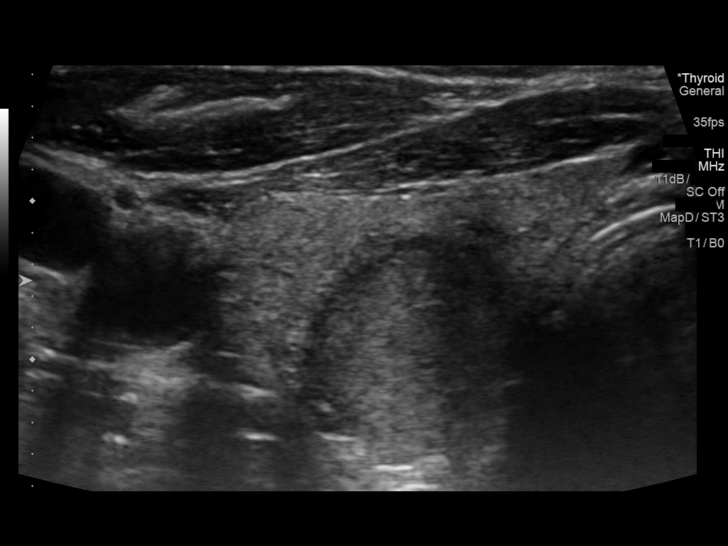
[im 33/53]
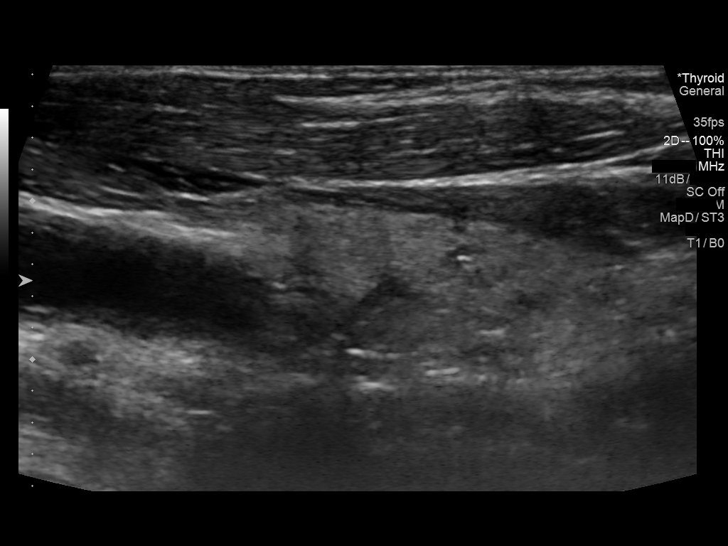
[im 37/53]
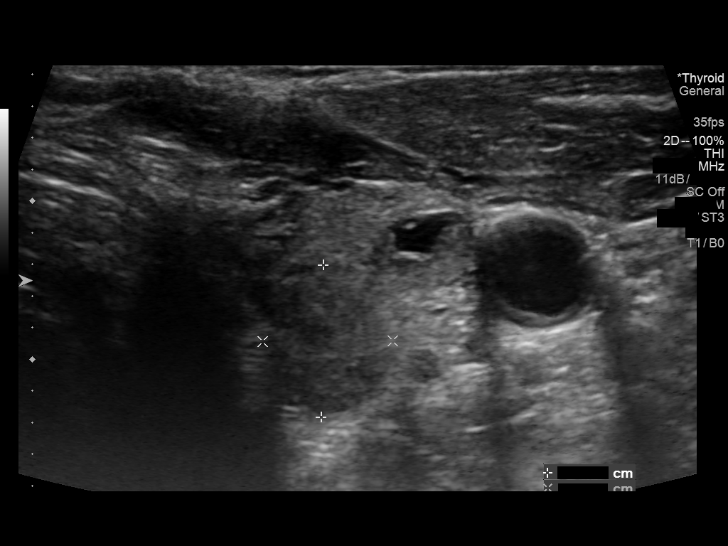
[im 42/53]
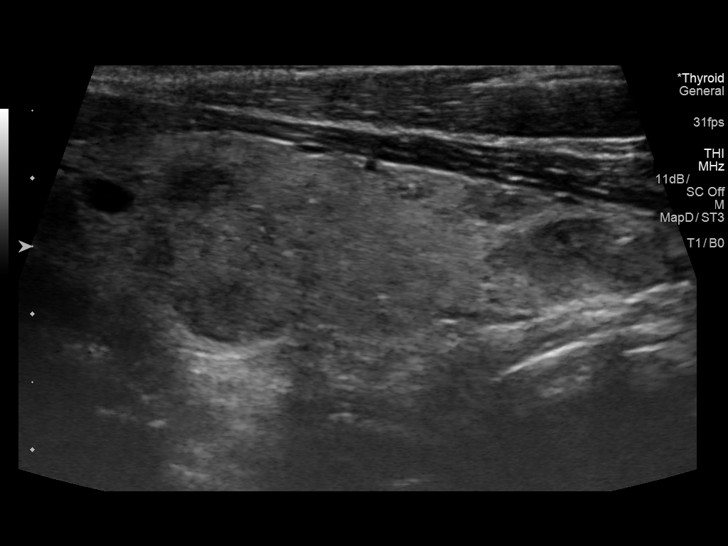
[im 46/53]
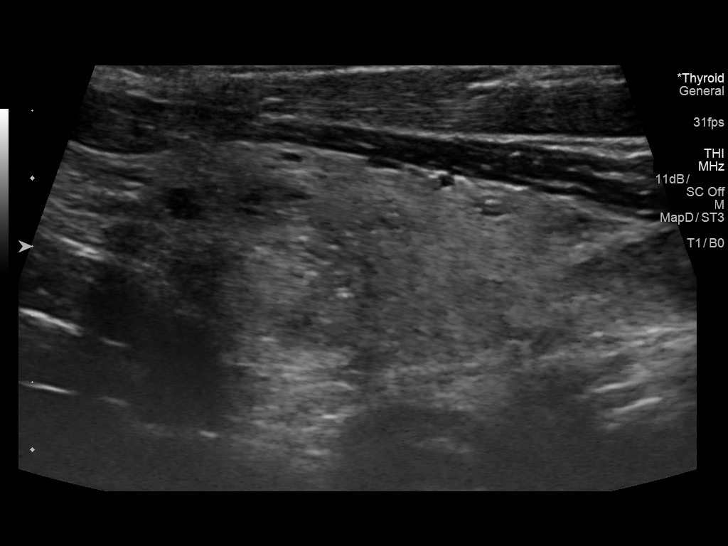
[im 50/53]
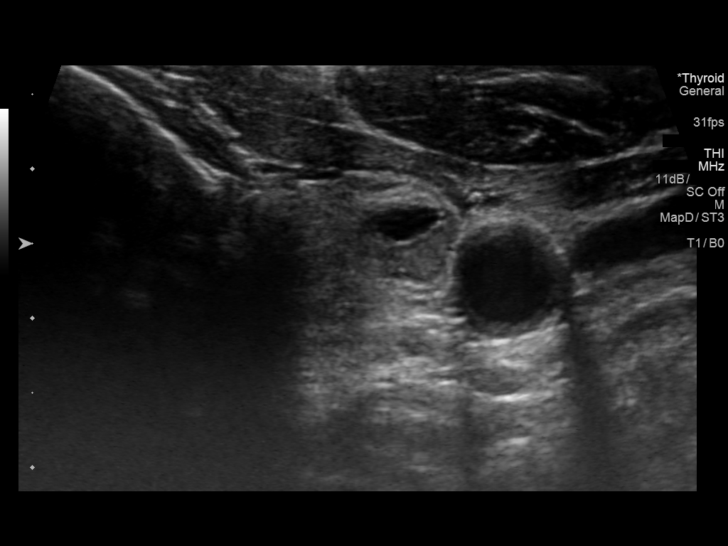

[12 of 25 positions shown; findings below may reference images not displayed]

FINDINGS: Parenchymal Echotexture: Moderately heterogenous

Isthmus: 0.3 cm thickness, stable

Right lobe: 5.4 x 1.8 x 1.7 cm, previously 4.6 x 1.6 x

Left lobe: 4.6 x 1.6 x 1.7 cm, previously 4.5 x 1.3 x

_________________________________________________________

Estimated total number of nodules >/= 1 cm: 4

Number of spongiform nodules >/=  2 cm not described below (TR1): 0

Number of mixed cystic and solid nodules >/= 1.5 cm not described
below (TR2): 0

_________________________________________________________

0.8 cm hypoechoic nodule without calcifications, superior right;
This nodule does NOT meet TI-RADS criteria for biopsy or dedicated
follow-up.

0.6 cm hypoechoic nodule without calcifications, mid right; This
nodule does NOT meet TI-RADS criteria for biopsy or dedicated
follow-up.

0.7 cm hypoechoic nodule without calcifications, posterior right;
This nodule does NOT meet TI-RADS criteria for biopsy or dedicated
follow-up.

Nodule # 4:

Prior biopsy: No

Location: Right; Inferior

Maximum size: 1.3 cm; Other 2 dimensions: 1.3 x 1.3 cm, previously,
1.3 x 1.2 x 1.2 cm

Composition: solid/almost completely solid (2)

Echogenicity: hypoechoic (2)

Shape: not taller-than-wide (0)

Margins: smooth (0)

Echogenic foci: none (0)

ACR TI-RADS total points: 4.

ACR TI-RADS risk category:  TR4 (4-6 points).

Significant change in size (>/= 20% in two dimensions and minimal
increase of 2 mm): No

Change in features: No

Change in ACR TI-RADS risk category: No

ACR TI-RADS recommendations:

*Given size (>/= 1 - 1.4 cm) and appearance, a follow-up ultrasound
in 1 year should be considered based on TI-RADS criteria.

_________________________________________________________

Nodule # 5:

Prior biopsy: No

Location: Left; Superior

Maximum size: 1 cm; Other 2 dimensions: 0.7 x 0.5 cm, previously, 1
x 0.7 x 0.5 cm

Composition: mixed cystic and solid (1)

Echogenicity: isoechoic (1)

Shape: not taller-than-wide (0)

Margins: smooth (0)

Echogenic foci: macrocalcifications (1)

ACR TI-RADS total points: 3.

ACR TI-RADS risk category:  TR3 (3 points).

Significant change in size (>/= 20% in two dimensions and minimal
increase of 2 mm): No

Change in features: No

Change in ACR TI-RADS risk category: No

ACR TI-RADS recommendations:

Given size (<1.4 cm) and appearance, this nodule does NOT meet
TI-RADS criteria for biopsy or dedicated follow-up.

_________________________________________________________

Nodule # 6:

Prior biopsy: No

Location: Left; Mid

Maximum size: 1.1 cm; Other 2 dimensions: 1 x 0.8 cm, previously,
1.2 x 1 x 1 cm

Composition: solid/almost completely solid (2)

Echogenicity: hypoechoic (2)

Shape: taller-than-wide (3)

Margins: ill-defined (0)

Echogenic foci: none (0)

ACR TI-RADS total points: 7.

ACR TI-RADS risk category:  TR5 (>/= 7 points).

Significant change in size (>/= 20% in two dimensions and minimal
increase of 2 mm): No

Change in features: No

Change in ACR TI-RADS risk category: No

ACR TI-RADS recommendations:

**Given size (>/= 1.0 cm) and appearance, fine needle aspiration of
this highly suspicious nodule should be considered based on TI-RADS
criteria.

_________________________________________________________

Nodule # 7:

Prior biopsy: No

Location: Left;

Maximum size: 1 cm; Other 2 dimensions: 1 x 0.6 cm, previously, 1 x
0.9 x 0.6 cm

Composition: solid/almost completely solid (2)

Echogenicity: hypoechoic (2)

Shape: not taller-than-wide (0)

Margins: ill-defined (0)

Echogenic foci: none (0)

ACR TI-RADS total points: 4.

ACR TI-RADS risk category:  TR4 (4-6 points).

Significant change in size (>/= 20% in two dimensions and minimal
increase of 2 mm): No

Change in features: No

Change in ACR TI-RADS risk category: No

ACR TI-RADS recommendations:

*Given size (>/= 1 - 1.4 cm) and appearance, a follow-up ultrasound
in 1 year should be considered based on TI-RADS criteria.
IMPRESSION: 1. Thyromegaly with bilateral nodules.
2. Recommend FNA biopsy of 1.1 cm mid left highly suspicious (TR5)
nodule.
3. Recommend annual/biennial ultrasound follow-up of additional
nodules as above, until stability x5 years confirmed.

The above is in keeping with the ACR TI-RADS recommendations - [HOSPITAL] 8579;[DATE].

## 2019-09-17 ENCOUNTER — Other Ambulatory Visit: Payer: Self-pay | Admitting: Dermatology

## 2019-09-17 DIAGNOSIS — C4492 Squamous cell carcinoma of skin, unspecified: Secondary | ICD-10-CM

## 2019-09-17 HISTORY — DX: Squamous cell carcinoma of skin, unspecified: C44.92

## 2019-10-02 DIAGNOSIS — I6523 Occlusion and stenosis of bilateral carotid arteries: Secondary | ICD-10-CM | POA: Insufficient documentation

## 2019-12-11 DIAGNOSIS — J3089 Other allergic rhinitis: Secondary | ICD-10-CM | POA: Diagnosis not present

## 2019-12-19 DIAGNOSIS — M533 Sacrococcygeal disorders, not elsewhere classified: Secondary | ICD-10-CM | POA: Diagnosis not present

## 2019-12-27 DIAGNOSIS — J3089 Other allergic rhinitis: Secondary | ICD-10-CM | POA: Diagnosis not present

## 2020-01-04 ENCOUNTER — Other Ambulatory Visit: Payer: Self-pay | Admitting: Medical

## 2020-01-08 ENCOUNTER — Other Ambulatory Visit: Payer: Self-pay | Admitting: Medical

## 2020-01-08 ENCOUNTER — Telehealth: Payer: Self-pay

## 2020-01-08 DIAGNOSIS — J3089 Other allergic rhinitis: Secondary | ICD-10-CM | POA: Diagnosis not present

## 2020-01-08 NOTE — Telephone Encounter (Signed)
Patient called and stated he called the pharmacy and they informed he does not have any refills on his medication. Called the pharmacy and they stated it was closed and they are not sure why. Verbal was given for the crestor #90 with refills.

## 2020-01-13 DIAGNOSIS — L570 Actinic keratosis: Secondary | ICD-10-CM | POA: Diagnosis not present

## 2020-01-13 DIAGNOSIS — C44329 Squamous cell carcinoma of skin of other parts of face: Secondary | ICD-10-CM | POA: Diagnosis not present

## 2020-01-15 ENCOUNTER — Other Ambulatory Visit: Payer: Self-pay

## 2020-01-15 ENCOUNTER — Encounter: Payer: Self-pay | Admitting: Cardiovascular Disease

## 2020-01-15 ENCOUNTER — Ambulatory Visit (INDEPENDENT_AMBULATORY_CARE_PROVIDER_SITE_OTHER): Payer: Medicare PPO | Admitting: Cardiovascular Disease

## 2020-01-15 VITALS — BP 145/78 | HR 69 | Ht 71.5 in | Wt 213.6 lb

## 2020-01-15 DIAGNOSIS — I10 Essential (primary) hypertension: Secondary | ICD-10-CM

## 2020-01-15 DIAGNOSIS — E785 Hyperlipidemia, unspecified: Secondary | ICD-10-CM

## 2020-01-15 DIAGNOSIS — I779 Disorder of arteries and arterioles, unspecified: Secondary | ICD-10-CM

## 2020-01-15 DIAGNOSIS — I350 Nonrheumatic aortic (valve) stenosis: Secondary | ICD-10-CM

## 2020-01-15 NOTE — Assessment & Plan Note (Signed)
Mild aortic stenosis by 2D echo recently performed 02/14/2018 with a valve area of 1.9 cm and a peak gradient of 19 mmHg.  He does have a 12/6 stock ejection murmur at the base.  We will recheck a 2D echocardiogram.  Specifically, he has no symptoms of chest pain or shortness of breath.

## 2020-01-15 NOTE — Patient Instructions (Addendum)
Medication Instructions:  No changes *If you need a refill on your cardiac medications before your next appointment, please call your pharmacy*  Lab Work: None ordered If you have labs (blood work) drawn today and your tests are completely normal, you will receive your results only by: Marland Kitchen MyChart Message (if you have MyChart) OR . A paper copy in the mail If you have any lab test that is abnormal or we need to change your treatment, we will call you to review the results.  Testing/Procedures: Your physician has requested that you have an echocardiogram. Echocardiography is a painless test that uses sound waves to create images of your heart. It provides your doctor with information about the size and shape of your heart and how well your heart's chambers and valves are working. You may receive an ultrasound enhancing agent through an IV if needed to better visualize your heart during the echo.This procedure takes approximately one hour. There are no restrictions for this procedure. This will take place at the [address].   Your physician has requested that you have a carotid duplex. This test is an ultrasound of the carotid arteries in your neck. It looks at blood flow through these arteries that supply the brain with blood. Allow one hour for this exam. There are no restrictions or special instructions. This will take place at [place], [address].     Follow-Up: At [facility], you and your health needs are our priority.  As part of our continuing mission to provide you with exceptional heart care, we have created designated Provider Care Teams.  These Care Teams include your primary Cardiologist (physician) and Advanced Practice Providers (APPs -  Physician Assistants and Nurse Practitioners) who all work together to provide you with the care you need, when you need it.  Your next appointment:   12 month(s)  The format for your next appointment:   In  Person  Provider:   You may see Dr. Gwenlyn Found or one of the following Advanced Practice Providers on your designated Care Team:    Kerin Ransom, PA-C  [place], [place]  Coletta Memos, [place]

## 2020-01-15 NOTE — Assessment & Plan Note (Signed)
History of essential hypertension with blood pressure measured today at 145/78.  He is on hydrochlorothiazide and metoprolol.

## 2020-01-15 NOTE — Progress Notes (Signed)
01/15/2020 Daniel [name]   July 28, 1946  [id number]  Primary Physician Tysinger, Camelia Eng, PA-C Primary Cardiologist: [name] MD Renae Gloss  HPI:  Daniel [name] is a 74 y.o. 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  He is referred by Dr. Earlean Shawl for cardiovascular evaluation because of an auscultated murmur. He saw Dr. Golden Hurter remotely. 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 He has rare episodes of tachy palpitations. His cardiovascular risk factor profile is positive for hypertension and hyperlipidemia but otherwise is benign. There is no family history of heart disease. He has never had a heart attack or stroke and denies chest pain or shortness of breath. He had an echo done on 2008 that showed normal LV systolic function with an aortic valve area of 1.79 cm consistent with mild aortic stenosis. He denies chest pain or shortness of breath.  Since I saw him 7 years ago he is remained stable.  He has had increasing frequency of symptomatic palpitations.  He decrease his caffeine intake and actually stopped his lipid-lowering drugs and his symptoms improved.  He denies chest pain or shortness of breath.  His last 2D echo performed 02/14/2018 revealed normal LV systolic function with a valve area of 1.9 cm and peak gradient of 19 mmHg.  No outpatient medications have been marked as taking for the 01/15/20 encounter (Office Visit) with [name], MD.     Allergies  Allergen Reactions  . Codeine Nausea Only and Other (See Comments)    Dizziness, sweat and will make patient pass out.  . Ampicillin     Rash   . Lipitor [Atorvastatin]     Elevated LFTs, no symptoms  . Sulfa Antibiotics Rash    Social History   Socioeconomic History  . Marital status: Married    Spouse name: Not on file  . Number of children: Not on file  . Years of  education: Not on file  . Highest education level: Not on file  Occupational History  . Occupation: Presenter, broadcasting; part time [employer]    Employer: [employer]  Tobacco Use  . Smoking status: Never Smoker  . Smokeless tobacco: Never Used  Substance and Sexual Activity  . Alcohol use: Yes    Alcohol/week: 2.0 standard drinks    Types: 2 Cans of beer per week    Comment: beer once or twice per week  . Drug use: No  . Sexual activity: Yes  Other Topics Concern  . Not on file  Social History Narrative   Married, no children, 2 dogs and a cat, exercise - aerobics, some weight bearing   Social Determinants of Health   Financial Resource Strain:   . Difficulty of Paying Living Expenses: Not on file  Food Insecurity:   . Worried About Charity fundraiser in the Last Year: Not on file  . Ran Out of Food in the Last Year: Not on file  Transportation Needs:   . Lack of Transportation (Medical): Not on file  . Lack of Transportation (Non-Medical): Not on file  Physical Activity:   . Days of Exercise per Week: Not on file  . Minutes of Exercise per Session: Not on file  Stress:   . Feeling of Stress : Not on file  Social Connections:   . Frequency of Communication with Friends and Family: Not  on file  . Frequency of Social Gatherings with Friends and Family: Not on file  . Attends Religious Services: Not on file  . Active Member of Clubs or Organizations: Not on file  . Attends Archivist Meetings: Not on file  . Marital Status: Not on file  Intimate Partner Violence:   . Fear of Current or Ex-Partner: Not on file  . Emotionally Abused: Not on file  . Physically Abused: Not on file  . Sexually Abused: Not on file     Review of Systems: General: negative for chills, fever, night sweats or weight changes.  Cardiovascular: negative for chest pain, dyspnea on exertion, edema, orthopnea, palpitations, paroxysmal nocturnal dyspnea or shortness of  breath Dermatological: negative for rash Respiratory: negative for cough or wheezing Urologic: negative for hematuria Abdominal: negative for nausea, vomiting, diarrhea, bright red blood per rectum, melena, or hematemesis Neurologic: negative for visual changes, syncope, or dizziness All other systems reviewed and are otherwise negative except as noted above.    Blood pressure (!) 145/78, pulse 69, height 5' 11.5" (1.816 m), weight 213 lb 9.6 oz (96.9 kg), SpO2 98 %.  General appearance: alert and no distress Neck: no adenopathy, no carotid bruit, no JVD, supple, symmetrical, trachea midline and thyroid not enlarged, symmetric, no tenderness/mass/nodules Lungs: clear to auscultation bilaterally Heart: 2/6 systolic ejection murmur at the base consistent with aortic stenosis. Extremities: extremities normal, atraumatic, no cyanosis or edema Pulses: 2+ and symmetric Skin: Skin color, texture, turgor normal. No rashes or lesions Neurologic: Alert and oriented X 3, normal strength and tone. Normal symmetric reflexes. Normal coordination and gait  EKG normal sinus rhythm at 69 with borderline left ventricular hypertrophy.  I personally reviewed this EKG.  ASSESSMENT AND PLAN:   Essential hypertension, benign History of essential hypertension with blood pressure measured today at 145/78.  He is on hydrochlorothiazide and metoprolol.  Dyslipidemia History of hyperlipidemia on Crestor and Zetia with lipid profile performed 05/15/2019 revealing total cholesterol 127, LDL 63 and HDL 42.  Aortic stenosis Mild aortic stenosis by 2D echo recently performed 02/14/2018 with a valve area of 1.9 cm and a peak gradient of 19 mmHg.  He does have a 12/6 stock ejection murmur at the base.  We will recheck a 2D echocardiogram.  Specifically, he has no symptoms of chest pain or shortness of breath.      [name] MD FACP,FACC,FAHA, [facility] 01/15/2020 2:51 PM

## 2020-01-15 NOTE — Assessment & Plan Note (Signed)
History of hyperlipidemia on Crestor and Zetia with lipid profile performed 05/15/2019 revealing total cholesterol 127, LDL 63 and HDL 42.

## 2020-01-24 DIAGNOSIS — J3089 Other allergic rhinitis: Secondary | ICD-10-CM | POA: Diagnosis not present

## 2020-01-24 DIAGNOSIS — J3081 Allergic rhinitis due to animal (cat) (dog) hair and dander: Secondary | ICD-10-CM | POA: Diagnosis not present

## 2020-01-29 ENCOUNTER — Ambulatory Visit (HOSPITAL_BASED_OUTPATIENT_CLINIC_OR_DEPARTMENT_OTHER): Payer: Medicare PPO

## 2020-01-29 ENCOUNTER — Other Ambulatory Visit: Payer: Self-pay

## 2020-01-29 DIAGNOSIS — I350 Nonrheumatic aortic (valve) stenosis: Secondary | ICD-10-CM

## 2020-01-29 DIAGNOSIS — I779 Disorder of arteries and arterioles, unspecified: Secondary | ICD-10-CM | POA: Diagnosis not present

## 2020-01-30 ENCOUNTER — Other Ambulatory Visit: Payer: Self-pay | Admitting: Cardiovascular Disease

## 2020-01-30 ENCOUNTER — Ambulatory Visit (HOSPITAL_COMMUNITY)
Admission: RE | Admit: 2020-01-30 | Discharge: 2020-01-30 | Disposition: A | Discharge status: Home / Self Care | Payer: Medicare PPO | Source: Ambulatory Visit | Attending: Cardiology | Admitting: Cardiology

## 2020-01-30 ENCOUNTER — Other Ambulatory Visit: Payer: Self-pay

## 2020-01-30 DIAGNOSIS — I350 Nonrheumatic aortic (valve) stenosis: Secondary | ICD-10-CM | POA: Insufficient documentation

## 2020-01-30 DIAGNOSIS — I779 Disorder of arteries and arterioles, unspecified: Secondary | ICD-10-CM | POA: Diagnosis not present

## 2020-01-30 DIAGNOSIS — M533 Sacrococcygeal disorders, not elsewhere classified: Secondary | ICD-10-CM | POA: Diagnosis not present

## 2020-02-05 ENCOUNTER — Ambulatory Visit: Payer: Medicare Other | Admitting: Medical

## 2020-02-05 DIAGNOSIS — J3089 Other allergic rhinitis: Secondary | ICD-10-CM | POA: Diagnosis not present

## 2020-02-20 DIAGNOSIS — J3089 Other allergic rhinitis: Secondary | ICD-10-CM | POA: Diagnosis not present

## 2020-02-20 DIAGNOSIS — J3081 Allergic rhinitis due to animal (cat) (dog) hair and dander: Secondary | ICD-10-CM | POA: Diagnosis not present

## 2020-03-04 DIAGNOSIS — J301 Allergic rhinitis due to pollen: Secondary | ICD-10-CM | POA: Diagnosis not present

## 2020-03-04 DIAGNOSIS — J3089 Other allergic rhinitis: Secondary | ICD-10-CM | POA: Diagnosis not present

## 2020-03-16 ENCOUNTER — Ambulatory Visit: Payer: Medicare PPO | Admitting: Dermatology

## 2020-03-18 DIAGNOSIS — J3081 Allergic rhinitis due to animal (cat) (dog) hair and dander: Secondary | ICD-10-CM | POA: Diagnosis not present

## 2020-03-18 DIAGNOSIS — J3089 Other allergic rhinitis: Secondary | ICD-10-CM | POA: Diagnosis not present

## 2020-04-02 DIAGNOSIS — J3089 Other allergic rhinitis: Secondary | ICD-10-CM | POA: Diagnosis not present

## 2020-04-16 DIAGNOSIS — J3081 Allergic rhinitis due to animal (cat) (dog) hair and dander: Secondary | ICD-10-CM | POA: Diagnosis not present

## 2020-04-16 DIAGNOSIS — J3089 Other allergic rhinitis: Secondary | ICD-10-CM | POA: Diagnosis not present

## 2020-05-05 DIAGNOSIS — J3089 Other allergic rhinitis: Secondary | ICD-10-CM | POA: Diagnosis not present

## 2020-05-11 DIAGNOSIS — J3089 Other allergic rhinitis: Secondary | ICD-10-CM | POA: Diagnosis not present

## 2020-05-19 DIAGNOSIS — J3081 Allergic rhinitis due to animal (cat) (dog) hair and dander: Secondary | ICD-10-CM | POA: Diagnosis not present

## 2020-05-19 DIAGNOSIS — J3089 Other allergic rhinitis: Secondary | ICD-10-CM | POA: Diagnosis not present

## 2020-05-28 ENCOUNTER — Telehealth: Payer: Self-pay | Admitting: Family Medicine

## 2020-05-28 ENCOUNTER — Other Ambulatory Visit: Payer: Self-pay | Admitting: Medical

## 2020-05-28 DIAGNOSIS — I10 Essential (primary) hypertension: Secondary | ICD-10-CM

## 2020-05-28 DIAGNOSIS — E785 Hyperlipidemia, unspecified: Secondary | ICD-10-CM

## 2020-05-28 NOTE — Telephone Encounter (Signed)
Pt. Requesting a refill on Zetia and metoprolol last apt was 05/15/19 and no future apt.

## 2020-05-28 NOTE — Telephone Encounter (Signed)
Pt called and made his [place] appt. He would like to have his Tyroid scan he gets prior to that appt. ALSO he would labs ahead of appt and would like orders placed in system. Please advise pt at [phone].

## 2020-05-28 NOTE — Telephone Encounter (Signed)
Patient wants PSA added to his labs and needs Thyroid ULTRASOUND before 7/27 visit so you will have results.

## 2020-06-02 ENCOUNTER — Other Ambulatory Visit: Payer: Self-pay | Admitting: Medical

## 2020-06-02 DIAGNOSIS — E042 Nontoxic multinodular goiter: Secondary | ICD-10-CM

## 2020-06-02 DIAGNOSIS — R7301 Impaired fasting glucose: Secondary | ICD-10-CM

## 2020-06-02 DIAGNOSIS — Z0000 Encounter for general adult medical examination without abnormal findings: Secondary | ICD-10-CM

## 2020-06-02 DIAGNOSIS — N400 Enlarged prostate without lower urinary tract symptoms: Secondary | ICD-10-CM

## 2020-06-02 DIAGNOSIS — K760 Fatty (change of) liver, not elsewhere classified: Secondary | ICD-10-CM

## 2020-06-02 DIAGNOSIS — I10 Essential (primary) hypertension: Secondary | ICD-10-CM

## 2020-06-02 DIAGNOSIS — I6523 Occlusion and stenosis of bilateral carotid arteries: Secondary | ICD-10-CM

## 2020-06-02 NOTE — Telephone Encounter (Signed)
Request has been made for patient to be scheduled before 06/23/20.

## 2020-06-02 NOTE — Telephone Encounter (Signed)
See message below.  I have put in fasting labs.  Please go ahead and schedule him for thyroid ultrasound given history of thyroid nodule

## 2020-06-04 DIAGNOSIS — J3089 Other allergic rhinitis: Secondary | ICD-10-CM | POA: Diagnosis not present

## 2020-06-12 ENCOUNTER — Ambulatory Visit
Admission: RE | Admit: 2020-06-12 | Discharge: 2020-06-12 | Disposition: A | Discharge status: Home / Self Care | Payer: Medicare PPO | Source: Ambulatory Visit | Attending: Medical | Admitting: Medical

## 2020-06-12 DIAGNOSIS — M5136 Other intervertebral disc degeneration, lumbar region: Secondary | ICD-10-CM | POA: Diagnosis not present

## 2020-06-12 DIAGNOSIS — E042 Nontoxic multinodular goiter: Secondary | ICD-10-CM

## 2020-06-12 DIAGNOSIS — Z0000 Encounter for general adult medical examination without abnormal findings: Secondary | ICD-10-CM

## 2020-06-12 DIAGNOSIS — E041 Nontoxic single thyroid nodule: Secondary | ICD-10-CM | POA: Diagnosis not present

## 2020-06-13 ENCOUNTER — Other Ambulatory Visit: Payer: Self-pay | Admitting: Medical

## 2020-06-15 ENCOUNTER — Encounter: Payer: Self-pay | Admitting: Dermatology

## 2020-06-15 ENCOUNTER — Ambulatory Visit (INDEPENDENT_AMBULATORY_CARE_PROVIDER_SITE_OTHER): Payer: Medicare PPO | Admitting: Dermatology

## 2020-06-15 ENCOUNTER — Other Ambulatory Visit: Payer: Self-pay

## 2020-06-15 DIAGNOSIS — L821 Other seborrheic keratosis: Secondary | ICD-10-CM

## 2020-06-15 DIAGNOSIS — D229 Melanocytic nevi, unspecified: Secondary | ICD-10-CM

## 2020-06-15 DIAGNOSIS — D485 Neoplasm of uncertain behavior of skin: Secondary | ICD-10-CM

## 2020-06-15 DIAGNOSIS — C44511 Basal cell carcinoma of skin of breast: Secondary | ICD-10-CM

## 2020-06-15 DIAGNOSIS — D225 Melanocytic nevi of trunk: Secondary | ICD-10-CM | POA: Diagnosis not present

## 2020-06-15 DIAGNOSIS — C4491 Basal cell carcinoma of skin, unspecified: Secondary | ICD-10-CM

## 2020-06-15 HISTORY — DX: Basal cell carcinoma of skin, unspecified: C44.91

## 2020-06-15 NOTE — Patient Instructions (Signed)

## 2020-06-16 ENCOUNTER — Other Ambulatory Visit: Payer: Medicare PPO

## 2020-06-16 ENCOUNTER — Encounter: Payer: Self-pay | Admitting: Dermatology

## 2020-06-16 DIAGNOSIS — Z0000 Encounter for general adult medical examination without abnormal findings: Secondary | ICD-10-CM | POA: Diagnosis not present

## 2020-06-16 DIAGNOSIS — I10 Essential (primary) hypertension: Secondary | ICD-10-CM | POA: Diagnosis not present

## 2020-06-16 DIAGNOSIS — E042 Nontoxic multinodular goiter: Secondary | ICD-10-CM | POA: Diagnosis not present

## 2020-06-16 DIAGNOSIS — N400 Enlarged prostate without lower urinary tract symptoms: Secondary | ICD-10-CM | POA: Diagnosis not present

## 2020-06-16 DIAGNOSIS — I6523 Occlusion and stenosis of bilateral carotid arteries: Secondary | ICD-10-CM | POA: Diagnosis not present

## 2020-06-16 NOTE — Progress Notes (Addendum)
   Follow-Up Visit   Subjective  Daniel [name] is a 74 y.o. male who presents for the following: Annual Exam (NEW DARK MOLE MID CHEST X 1 MONTH).  Change in mole Location: Chest Duration: Months Quality: Darker and larger Associated Signs/Symptoms: Modifying Factors:  Severity:  Timing: Context:   Objective  Well appearing patient in no apparent distress; mood and affect are within normal limits.  All skin waist up examined.   Assessment & Plan   Patient Instructions  Biopsy, Surgery (Curettage) & Surgery (Excision) Aftercare Instructions  1. Okay to remove bandage in 24 hours  2. Wash area with soap and water  3. Apply Vaseline to area twice daily until healed (Not Neosporin)  4. Okay to cover with a Band-Aid to decrease the chance of infection or prevent irritation from clothing; also it's okay to uncover lesion at home.  5. Suture instructions: return to our office in 7-10 or 10-14 days for a nurse visit for suture removal. Variable healing with sutures, if pain or itching occurs call our office. It's okay to shower or bathe 24 hours after sutures are given.  6. The following risks may occur after a biopsy, curettage or excision: bleeding, scarring, discoloration, recurrence, infection (redness, yellow drainage, pain or swelling).  7. For questions, concerns and results call our office at [place] before 4pm & Friday before 3pm. Biopsy results will be available in 1 week.    Neoplasm of uncertain behavior of skin Left Breast  Skin / nail biopsy Type of biopsy: tangential   Informed consent: discussed and consent obtained   Timeout: patient name, date of birth, surgical site, and procedure verified   Procedure prep:  Patient was prepped and draped in usual sterile fashion Prep type:  Chlorhexidine Anesthesia: the lesion was anesthetized in a standard fashion   Anesthetic:  1% lidocaine w/ epinephrine 1-100,000 local infiltration Instrument used:  flexible razor blade   Hemostasis achieved with: ferric subsulfate   Outcome: patient tolerated procedure well   Post-procedure details: wound care instructions given    Specimen 1 - Surgical pathology Differential Diagnosis: BCC vs SCC Check Margins: No  Seborrheic keratosis Right Breast  Leave if stable  Nevus Mid Back  Annual skin examination     I, Lavonna Monarch, MD, have reviewed all documentation for this visit.  The documentation on 06/28/20 for the exam, diagnosis, procedures, and orders are all accurate and complete.

## 2020-06-17 ENCOUNTER — Telehealth: Payer: Self-pay | Admitting: *Deleted

## 2020-06-17 ENCOUNTER — Encounter: Payer: Self-pay | Admitting: *Deleted

## 2020-06-17 LAB — COMPREHENSIVE METABOLIC PANEL
ALT: 41 IU/L (ref 0–44)
AST: 32 IU/L (ref 0–40)
Albumin/Globulin Ratio: 2.2 (ref 1.2–2.2)
Albumin: 4.6 g/dL (ref 3.7–4.7)
Alkaline Phosphatase: 50 IU/L (ref 48–121)
BUN/Creatinine Ratio: 14 (ref 10–24)
BUN: 20 mg/dL (ref 8–27)
Bilirubin Total: 0.6 mg/dL (ref 0.0–1.2)
CO2: 27 mmol/L (ref 20–29)
Calcium: 9.7 mg/dL (ref 8.6–10.2)
Chloride: 103 mmol/L (ref 96–106)
Creatinine, Ser: 1.42 mg/dL — ABNORMAL HIGH (ref 0.76–1.27)
GFR calc Af Amer: 57 mL/min/{1.73_m2} — ABNORMAL LOW (ref >59)
GFR calc non Af Amer: 49 mL/min/{1.73_m2} — ABNORMAL LOW (ref >59)
Globulin, Total: 2.1 g/dL (ref 1.5–4.5)
Glucose: 90 mg/dL (ref 65–99)
Potassium: 4.4 mmol/L (ref 3.5–5.2)
Sodium: 140 mmol/L (ref 134–144)
Total Protein: 6.7 g/dL (ref 6.0–8.5)

## 2020-06-17 LAB — LIPID PANEL
Chol/HDL Ratio: 3 ratio (ref 0.0–5.0)
Cholesterol, Total: 136 mg/dL (ref 100–199)
HDL: 45 mg/dL (ref >39)
LDL Chol Calc (NIH): 73 mg/dL (ref 0–99)
Triglycerides: 97 mg/dL (ref 0–149)
VLDL Cholesterol Cal: 18 mg/dL (ref 5–40)

## 2020-06-17 LAB — TSH: TSH: 1.76 u[IU]/mL (ref 0.450–4.500)

## 2020-06-17 LAB — PSA: Prostate Specific Ag, Serum: 2.5 ng/mL (ref 0.0–4.0)

## 2020-06-17 LAB — T4, FREE: Free T4: 1.18 ng/dL (ref 0.82–1.77)

## 2020-06-17 NOTE — Telephone Encounter (Signed)
-----   Message from Lavonna Monarch, MD sent at 06/17/2020  5:10 AM EDT ----- Schedule surgery with Dr. Darene Lamer

## 2020-06-17 NOTE — Telephone Encounter (Signed)
Path report to patient. Made a surgery appointment.

## 2020-06-18 DIAGNOSIS — J3081 Allergic rhinitis due to animal (cat) (dog) hair and dander: Secondary | ICD-10-CM | POA: Diagnosis not present

## 2020-06-18 DIAGNOSIS — J3089 Other allergic rhinitis: Secondary | ICD-10-CM | POA: Diagnosis not present

## 2020-06-23 ENCOUNTER — Other Ambulatory Visit: Payer: Self-pay

## 2020-06-23 ENCOUNTER — Encounter: Payer: Self-pay | Admitting: Medical

## 2020-06-23 ENCOUNTER — Ambulatory Visit (INDEPENDENT_AMBULATORY_CARE_PROVIDER_SITE_OTHER): Payer: Medicare PPO | Admitting: Medical

## 2020-06-23 VITALS — BP 148/90 | HR 60 | Ht 72.0 in | Wt 211.8 lb

## 2020-06-23 DIAGNOSIS — C4490 Unspecified malignant neoplasm of skin, unspecified: Secondary | ICD-10-CM | POA: Diagnosis not present

## 2020-06-23 DIAGNOSIS — N400 Enlarged prostate without lower urinary tract symptoms: Secondary | ICD-10-CM

## 2020-06-23 DIAGNOSIS — I6529 Occlusion and stenosis of unspecified carotid artery: Secondary | ICD-10-CM

## 2020-06-23 DIAGNOSIS — M533 Sacrococcygeal disorders, not elsewhere classified: Secondary | ICD-10-CM | POA: Diagnosis not present

## 2020-06-23 DIAGNOSIS — M62838 Other muscle spasm: Secondary | ICD-10-CM

## 2020-06-23 DIAGNOSIS — Z0000 Encounter for general adult medical examination without abnormal findings: Secondary | ICD-10-CM | POA: Diagnosis not present

## 2020-06-23 DIAGNOSIS — L989 Disorder of the skin and subcutaneous tissue, unspecified: Secondary | ICD-10-CM

## 2020-06-23 DIAGNOSIS — I10 Essential (primary) hypertension: Secondary | ICD-10-CM | POA: Diagnosis not present

## 2020-06-23 DIAGNOSIS — Z1211 Encounter for screening for malignant neoplasm of colon: Secondary | ICD-10-CM | POA: Insufficient documentation

## 2020-06-23 DIAGNOSIS — R7301 Impaired fasting glucose: Secondary | ICD-10-CM

## 2020-06-23 DIAGNOSIS — N289 Disorder of kidney and ureter, unspecified: Secondary | ICD-10-CM

## 2020-06-23 DIAGNOSIS — E782 Mixed hyperlipidemia: Secondary | ICD-10-CM

## 2020-06-23 DIAGNOSIS — Z7189 Other specified counseling: Secondary | ICD-10-CM

## 2020-06-23 DIAGNOSIS — I350 Nonrheumatic aortic (valve) stenosis: Secondary | ICD-10-CM | POA: Diagnosis not present

## 2020-06-23 DIAGNOSIS — L821 Other seborrheic keratosis: Secondary | ICD-10-CM

## 2020-06-23 DIAGNOSIS — E042 Nontoxic multinodular goiter: Secondary | ICD-10-CM

## 2020-06-23 DIAGNOSIS — K760 Fatty (change of) liver, not elsewhere classified: Secondary | ICD-10-CM | POA: Diagnosis not present

## 2020-06-23 DIAGNOSIS — M4180 Other forms of scoliosis, site unspecified: Secondary | ICD-10-CM

## 2020-06-23 DIAGNOSIS — E785 Hyperlipidemia, unspecified: Secondary | ICD-10-CM

## 2020-06-23 DIAGNOSIS — M5416 Radiculopathy, lumbar region: Secondary | ICD-10-CM

## 2020-06-23 DIAGNOSIS — I6523 Occlusion and stenosis of bilateral carotid arteries: Secondary | ICD-10-CM | POA: Diagnosis not present

## 2020-06-23 DIAGNOSIS — M549 Dorsalgia, unspecified: Secondary | ICD-10-CM

## 2020-06-23 DIAGNOSIS — G5790 Unspecified mononeuropathy of unspecified lower limb: Secondary | ICD-10-CM

## 2020-06-23 DIAGNOSIS — M48061 Spinal stenosis, lumbar region without neurogenic claudication: Secondary | ICD-10-CM

## 2020-06-23 DIAGNOSIS — M5030 Other cervical disc degeneration, unspecified cervical region: Secondary | ICD-10-CM

## 2020-06-23 DIAGNOSIS — Z7185 Encounter for immunization safety counseling: Secondary | ICD-10-CM

## 2020-06-23 NOTE — Progress Notes (Signed)
Subjective:    Daniel [name] is a 74 y.o. male who presents for Preventative Services visit and chronic medical problems/med check visit.    Primary Care Provider Garima Chronis, Camelia Eng, PA-C here for primary care  Current Health Care Team:  Dentist, Dr. Georgana Curio doctor, Dr. Nicki Reaper   Dr. Susa Day, orthopedics  Dr. Quay Burow, cardiology  Dr. Lavonna Monarch, dermatology  Dr. [name], Gastroenterology  Medical Services you may have received from other than [facility] providers in the past year (date may be approximate) none  Exercise Current exercise habits: Home exercise routine includes stretching. Patient also participate in weight lifting/strength training.   Nutrition/Diet Current diet: in general, a "healthy" diet    Depression Screen Depression screen [facility] 2/9 06/23/2020  Decreased Interest 0  Down, Depressed, Hopeless 0  PHQ - 2 Score 0    Activities of Daily Living Screen/Functional Status Survey Is the patient deaf or have difficulty hearing?: Yes (left ear) Does the patient have difficulty seeing, even when wearing glasses/contacts?: No Does the patient have difficulty concentrating, remembering, or making decisions?: No Does the patient have difficulty walking or climbing stairs?: No Does the patient have difficulty dressing or bathing?: No Does the patient have difficulty doing errands alone such as visiting a doctor's office or shopping?: No  Can patient draw a clock face showing 11:00 o'clock, yes   Fall Risk Screen Fall Risk  06/23/2020 05/15/2019 05/07/2018 09/11/2017 07/30/2014  Falls in the past year? 0 0 No No No    Gait Assessment: Normal gait observed - yes  Advanced directives Does patient have a Lillian? No Does patient have a Living Will? No  Past Medical History:  Diagnosis Date  . Abdominal pain    with coffee  . Abdominal ultrasound, abnormal 12/09/09   fatty liver disease  . Abnormal carotid  ultrasound 03/01/07   R no ICA stenosis. 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 Allergy    allergy shots, cat allergy; Dr. Mosetta Anis;  . Arthritis   . Basal cell carcinoma 06/15/2020   Left Breast (nodular)  . Diverticulosis   . Dyslipidemia   . Dysplastic nevus 03/01/1993   slight, left back and mid lower back, no treatment  . Dysplastic nevus 03/04/2004   moderate-marked, low mid back, excision  . Dysplastic nevus 06/14/2010   mild-mod, left hip, wider shave  . Fatty liver   . H/O CT scan of abdomen 03/20/12   abdomen/pelvis - normal  . H/O CT scan of chest 03/20/12   normal  . H/O diagnostic ultrasound 03/06/11   Aortic ultrasound - no AAA identified  . H/O echocardiogram 10/18/2011   transthoracic - mild concentric LVH, EF 55%, mild valvular aortic stenosis, LA mild to moderate dilation, otherwise normal echo; Dr. Rollene Fare  . Heart murmur, aortic    cardiac eval 09/2011, Dr. Gwenlyn Found [facility]), mild aortic stenosis  . Hypertension   . Inguinal hernia 2012   right  . Normal cardiac stress test 03/02/07   Myoview, EF 65%; Dr. Golden Hurter  . SCC (squamous cell carcinoma) 09/17/2019   well diff, left sideburn, Cx3, 5FU  . Statin intolerance    intolerance with Lipitor  . Testicular pain 01/08/10   Urology eval, Dr. Alinda Money    Past Surgical History:  Procedure Laterality Date  . COLONOSCOPY  10/21/2008   internal hemorrhoids, diverticulosis; repeat 10/2018; Dr. Scarlette Shorts, [place]  . HAND SURGERY  2004   left  ganglion cyst removed   . NOSE SURGERY  2005   obstruction, polyp and deviated septum; Dr. Redmond Baseman  . SHOULDER SURGERY  2002, 2008   bilat, RTC      Family History  Problem Relation Age of Onset  . Cancer Mother        breast  . Dementia Mother 71  . Kidney disease Father 39       kidney failure  . Rheum arthritis Father   . Diabetes Neg Hx   . Heart disease Neg Hx   . Stroke Neg Hx   . Hypertension Neg Hx   . Hyperlipidemia Neg Hx       Current Outpatient Medications:  .  ezetimibe (ZETIA) 10 MG tablet, TAKE 1 TABLET(10 MG) BY MOUTH DAILY, Disp: 90 tablet, Rfl: 0 .  hydrochlorothiazide (HYDRODIURIL) 12.5 MG tablet, TAKE 1 TABLET(12.5 MG) BY MOUTH DAILY, Disp: 90 tablet, Rfl: 3 .  metoprolol succinate (TOPROL-XL) 50 MG 24 hr tablet, TAKE 1 TABLET BY MOUTH DAILY WITH OR IMMEDIATELY FOLLOWING A MEAL, Disp: 90 tablet, Rfl: 0 .  Multiple Vitamin (MULTIVITAMIN) tablet, Take 1 tablet by mouth daily.  , Disp: , Rfl:  .  pregabalin (LYRICA) 75 MG capsule, Take 75 mg by mouth daily., Disp: , Rfl:  .  rosuvastatin (CRESTOR) 40 MG tablet, Take 1 tablet (40 mg total) by mouth daily., Disp: 90 tablet, Rfl: 3 .  tiZANidine (ZANAFLEX) 4 MG tablet, TK 1/2 TO 1 T PO TID PRF SPASMS, Disp: , Rfl:  .  aspirin EC 81 MG tablet, Take 1 tablet (81 mg total) by mouth daily. (Patient not taking: Reported on 06/23/2020), Disp: 90 tablet, Rfl: 3 .  diclofenac (VOLTAREN) 75 MG EC tablet, , Disp: , Rfl:   Allergies  Allergen Reactions  . Codeine Nausea Only and Other (See Comments)    Dizziness, sweat and will make patient pass out.  . Ampicillin     Rash   . Lipitor [Atorvastatin]     Elevated LFTs, no symptoms  . Sulfa Antibiotics Rash    History reviewed: allergies, current medications, past family history, past medical history, past social history, past surgical history and problem list  issues discussed: He has been having coccyx pain.  He has fallen a few times on his butt bone over the last few months for various issues.  When it started to get better he was riding a bicycle but that made it worse.  He has seen orthopedics had a recent MRI is waiting follow-up on this  Uses the HCTZ every other day  He uses Lyrica as needed, not daily  Compliant with his other medications including cholesterol and blood pressure medicine    Objective:      Biometrics BP (!) 148/90   Pulse 60   Ht 6' (1.829 m)   Wt (!) 211 lb 12.8 oz (96.1  kg)   SpO2 98%   BMI 28.73 kg/m   BP Readings from Last 3 Encounters:  06/23/20 (!) 148/90  01/15/20 (!) 145/78  05/15/19 120/86   Wt Readings from Last 3 Encounters:  06/23/20 (!) 211 lb 12.8 oz (96.1 kg)  01/15/20 213 lb 9.6 oz (96.9 kg)  05/15/19 210 lb 12.8 oz (95.6 kg)      Cognitive Testing  Alert? Yes  Normal Appearance?Yes  Oriented to person? Yes  Place? Yes   Time? Yes  Recall of three objects?  Yes  Can perform simple calculations? Yes  Displays appropriate judgment?Yes  Can  read the correct time from a watch face?Yes  General appearance: alert, no distress, WD/WN, white male  Nutritional Status: Inadequate calore intake? no Loss of muscle mass? no Loss of fat beneath skin? no Localized or general edema? no Diminished functional status? no  Other pertinent exam: Skin: scattered macules, no worrisome lesions Neck: supple, no lymphadenopathy, no thyromegaly, no masses, no bruits Heart: RRR, normal S1, S2, no murmurs Lungs: CTA bilaterally, no wheezes, rhonchi, or rales Abdomen: +bs, soft, non tender, non distended, no masses, no hepatomegaly, no splenomegaly Musculoskeletal: nontender, no swelling, no obvious deformity Extremities: no edema, no cyanosis, no clubbing Pulses: 2+ symmetric, upper and lower extremities, normal cap refill Neurological: alert, oriented x 3, CN2-12 intact, strength normal upper extremities and lower extremities, sensation normal throughout, DTRs 2+ throughout, no cerebellar signs, gait normal Psychiatric: normal affect, behavior normal, pleasant  GU: deferred DRE: anus normal appearing, tender soft tissue just below coccyx but no erythema, fluctuance, induration    Assessment:   Encounter Diagnoses  Name Primary?  . Encounter for health maintenance examination in adult Yes  . Medicare annual wellness visit, subsequent   . Dyslipidemia   . Essential hypertension, benign   . Stenosis of carotid artery, unspecified  laterality   . Atherosclerosis of both carotid arteries   . Aortic valve stenosis, etiology of cardiac valve disease unspecified   . Fatty liver   . Multinodular thyroid   . Impaired fasting blood sugar   . Neuropathy of lower extremity, unspecified laterality   . Lumbar radiculopathy   . DDD (degenerative disc disease), cervical   . Skin lesion   . Other form of scoliosis, unspecified spinal region   . Seborrheic keratosis   . Benign prostatic hyperplasia, unspecified whether lower urinary tract symptoms present   . Impaired renal function   . Advanced directives, counseling/discussion   . Vaccine counseling   . Muscle spasm   . Spinal stenosis of lumbar region, unspecified whether neurogenic claudication present   . Back pain, unspecified back location, unspecified back pain laterality, unspecified chronicity   . Screen for colon cancer   . Skin cancer   . Coccydynia      Plan:   A preventative services visit was completed today.  During the course of the visit today, we discussed and counseled about appropriate screening and preventive services.  A health risk assessment was established today that included a review of current medications, allergies, social history, family history, medical and preventative health history, biometrics, and preventative screenings to identify potential safety concerns or impairments.  A personalized plan was printed today for your records and use.   Personalized health advice and education was given today to reduce health risks and promote self management and wellness.  Information regarding end of life planning was discussed today.  He came in ahead of his appointment for routine labs which we discussed today  Chronic problems discussed today: Dyslipidemia-continue statin, reviewed recent lipid panel  Carotid artery atherosclerosis-continue lipid-lowering medication  Hypertension-continue current medication, work on weight loss through healthy  diet and exercise, limit salt and junk food.  He declines additional medication at this time.  I had recommended modification of his blood pressure medicine but he wants to work on lifestyle changes.  Fatty liver disease-counseled on need for healthy low-fat diet weight loss regular exercise.  Discussed prior findings on scan  Multinodular thyroid-reviewed recent ultrasound.  Repeat ultrasound in 1 year.  Recent thyroid labs normal  Neuropathy-I advised Lyrica daily either  at the current dose of lower dose instead of as needed use  Chronic back pain-continue routine exercise, consider swimming or stretching or other less impact exercise  Muscle spasm-uses muscle relaxer as needed  Skin cancer-follow-up with dermatology as planned   Acute problems discussed today: Coccydynia -no obvious signs of abscess or localized infection.  He has follow-up with orthopedics plan.  For now continue to use donut pillow.  Advised swim therapy or water aerobics as an alternate form of exercise   Recommendations:  I recommend a yearly ophthalmology/optometry visit for glaucoma screening and eye checkup  I recommended a yearly dental visit for hygiene and checkup  Advanced directives - discussed nature and purpose of Advanced Directives, encouraged them to complete them if they have not done so and/or encouraged them to get [place] a copy if they have done this already.   Referrals today: colonoscopy  Immunizations: I recommended a yearly influenza vaccine, typically in September when the vaccine is usually available Is the Pneumococcal vaccine up to date: yes. Is the Shingles vaccine up to date: yes.   Is the Td/Tdap vaccine up to date: no.  He will check insurance coverages Covid vaccine up to date.   Donnel was seen today for [employer].  Diagnoses and all orders for this visit:  Encounter for health maintenance examination in adult  Medicare annual wellness visit,  subsequent  Dyslipidemia  Essential hypertension, benign  Stenosis of carotid artery, unspecified laterality  Atherosclerosis of both carotid arteries  Aortic valve stenosis, etiology of cardiac valve disease unspecified  Fatty liver  Multinodular thyroid  Impaired fasting blood sugar  Neuropathy of lower extremity, unspecified laterality  Lumbar radiculopathy  DDD (degenerative disc disease), cervical  Skin lesion  Other form of scoliosis, unspecified spinal region  Seborrheic keratosis  Benign prostatic hyperplasia, unspecified whether lower urinary tract symptoms present  Impaired renal function  Advanced directives, counseling/discussion  Vaccine counseling  Muscle spasm  Spinal stenosis of lumbar region, unspecified whether neurogenic claudication present  Back pain, unspecified back location, unspecified back pain laterality, unspecified chronicity  Screen for colon cancer -     Ambulatory referral to Gastroenterology  Skin cancer  Coccydynia  Other orders -     rosuvastatin (CRESTOR) 40 MG tablet; Take 1 tablet (40 mg total) by mouth daily.      Medicare Attestation A preventative services visit was completed today.  During the course of the visit the patient was educated and counseled about appropriate screening and preventive services.  A health risk assessment was established with the patient that included a review of current medications, allergies, social history, family history, medical and preventative health history, biometrics, and preventative screenings to identify potential safety concerns or impairments.  A personalized plan was printed today for the patient's records and use.   Personalized health advice and education was given today to reduce health risks and promote self management and wellness.  Information regarding end of life planning was discussed today.  Dorothea Ogle, PA-C   06/24/2020

## 2020-06-24 DIAGNOSIS — M533 Sacrococcygeal disorders, not elsewhere classified: Secondary | ICD-10-CM | POA: Insufficient documentation

## 2020-06-24 MED ORDER — ROSUVASTATIN CALCIUM 40 MG PO TABS: 40.0000 mg | ORAL_TABLET | Freq: Every day | ORAL | 3 refills | Status: DC

## 2020-06-26 DIAGNOSIS — J3081 Allergic rhinitis due to animal (cat) (dog) hair and dander: Secondary | ICD-10-CM | POA: Diagnosis not present

## 2020-06-29 DIAGNOSIS — M545 Low back pain: Secondary | ICD-10-CM | POA: Diagnosis not present

## 2020-06-29 DIAGNOSIS — M5136 Other intervertebral disc degeneration, lumbar region: Secondary | ICD-10-CM | POA: Diagnosis not present

## 2020-06-30 ENCOUNTER — Telehealth: Payer: Self-pay | Admitting: Medical

## 2020-06-30 ENCOUNTER — Other Ambulatory Visit: Payer: Self-pay

## 2020-06-30 DIAGNOSIS — Z1211 Encounter for screening for malignant neoplasm of colon: Secondary | ICD-10-CM

## 2020-06-30 NOTE — Telephone Encounter (Signed)
GI referral should be thorugh Dr. Earlean Shawl, his Gi, not [place]

## 2020-06-30 NOTE — Telephone Encounter (Signed)
Referral has been sent to Dr. Earlean Shawl.

## 2020-07-01 DIAGNOSIS — J3089 Other allergic rhinitis: Secondary | ICD-10-CM | POA: Diagnosis not present

## 2020-07-03 DIAGNOSIS — J3089 Other allergic rhinitis: Secondary | ICD-10-CM | POA: Diagnosis not present

## 2020-07-09 DIAGNOSIS — J3089 Other allergic rhinitis: Secondary | ICD-10-CM | POA: Diagnosis not present

## 2020-07-09 DIAGNOSIS — J3081 Allergic rhinitis due to animal (cat) (dog) hair and dander: Secondary | ICD-10-CM | POA: Diagnosis not present

## 2020-07-13 ENCOUNTER — Ambulatory Visit (INDEPENDENT_AMBULATORY_CARE_PROVIDER_SITE_OTHER): Payer: Medicare PPO | Admitting: Medical

## 2020-07-13 ENCOUNTER — Other Ambulatory Visit: Payer: Self-pay

## 2020-07-13 ENCOUNTER — Encounter: Payer: Self-pay | Admitting: Medical

## 2020-07-13 VITALS — BP 136/84 | HR 57 | Ht 71.5 in | Wt 210.6 lb

## 2020-07-13 DIAGNOSIS — Z87440 Personal history of urinary (tract) infections: Secondary | ICD-10-CM | POA: Insufficient documentation

## 2020-07-13 DIAGNOSIS — R3129 Other microscopic hematuria: Secondary | ICD-10-CM | POA: Diagnosis not present

## 2020-07-13 DIAGNOSIS — R109 Unspecified abdominal pain: Secondary | ICD-10-CM | POA: Diagnosis not present

## 2020-07-13 LAB — POCT URINALYSIS DIP (PROADVANTAGE DEVICE)
Bilirubin, UA: NEGATIVE
Glucose, UA: NEGATIVE mg/dL
Ketones, POC UA: NEGATIVE mg/dL
Leukocytes, UA: NEGATIVE
Nitrite, UA: NEGATIVE
Protein Ur, POC: NEGATIVE mg/dL
Specific Gravity, Urine: 1.015
Urobilinogen, Ur: NEGATIVE
pH, UA: 6 (ref 5.0–8.0)

## 2020-07-13 MED ORDER — NITROFURANTOIN MONOHYD MACRO 100 MG PO CAPS
100.0000 mg | ORAL_CAPSULE | Freq: Two times a day (BID) | ORAL | 0 refills | Status: DC
Start: 2020-07-13 — End: 2021-01-13

## 2020-07-13 NOTE — Progress Notes (Signed)
Subjective: Chief Complaint  Patient presents with  . Flank Pain    left side    Here for flank pain, possible kidney infection.  Started yesterday with sharp pain in left flank.  Doesn't feel like pulled muscle as activity /motion doesn't change the pain.  Had constant pain all evening.   Drank some lemon water which helped after 30 minutes.   However, a while later pain kicked back up.  Pain continued through the night.   This morning not as bad but pain still there.    No testicle pain, no testicle swelling, no blood in urine.  No urinary urgency and pain with urination.  Last kidney infection was years ago.  No prior kidney stone.  Has been treated for kidney infection in the past.  No fever, no NVD.   No other aggravating or relieving factors. No other complaint.   Past Medical History:  Diagnosis Date  . Abdominal pain    with coffee  . Abdominal ultrasound, abnormal 12/09/09   fatty liver disease  . Abnormal carotid ultrasound 03/01/07   R no ICA stenosis. 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 Allergy    allergy shots, cat allergy; Dr. Mosetta Anis;  . Arthritis   . Basal cell carcinoma 06/15/2020   Left Breast (nodular)  . Diverticulosis   . Dyslipidemia   . Dysplastic nevus 03/01/1993   slight, left back and mid lower back, no treatment  . Dysplastic nevus 03/04/2004   moderate-marked, low mid back, excision  . Dysplastic nevus 06/14/2010   mild-mod, left hip, wider shave  . Fatty liver   . H/O CT scan of abdomen 03/20/12   abdomen/pelvis - normal  . H/O CT scan of chest 03/20/12   normal  . H/O diagnostic ultrasound 03/06/11   Aortic ultrasound - no AAA identified  . H/O echocardiogram 10/18/2011   transthoracic - mild concentric LVH, EF 55%, mild valvular aortic stenosis, LA mild to moderate dilation, otherwise normal echo; Dr. Rollene Fare  . Heart murmur, aortic    cardiac eval 09/2011, Dr. Gwenlyn Found [facility]), mild aortic stenosis  . Hypertension   .  Inguinal hernia 2012   right  . Normal cardiac stress test 03/02/07   Myoview, EF 65%; Dr. Golden Hurter  . SCC (squamous cell carcinoma) 09/17/2019   well diff, left sideburn, Cx3, 5FU  . Statin intolerance    intolerance with Lipitor  . Testicular pain 01/08/10   Urology eval, Dr. Alinda Money   Current Outpatient Medications on File Prior to Visit  Medication Sig Dispense Refill  . ezetimibe (ZETIA) 10 MG tablet TAKE 1 TABLET(10 MG) BY MOUTH DAILY 90 tablet 0  . hydrochlorothiazide (HYDRODIURIL) 12.5 MG tablet TAKE 1 TABLET(12.5 MG) BY MOUTH DAILY 90 tablet 3  . metoprolol succinate (TOPROL-XL) 50 MG 24 hr tablet TAKE 1 TABLET BY MOUTH DAILY WITH OR IMMEDIATELY FOLLOWING A MEAL 90 tablet 0  . Multiple Vitamin (MULTIVITAMIN) tablet Take 1 tablet by mouth daily.      . pregabalin (LYRICA) 75 MG capsule Take 75 mg by mouth daily.    . rosuvastatin (CRESTOR) 40 MG tablet Take 1 tablet (40 mg total) by mouth daily. 90 tablet 3  . tiZANidine (ZANAFLEX) 4 MG tablet TK 1/2 TO 1 T PO TID PRF SPASMS    . aspirin EC 81 MG tablet Take 1 tablet (81 mg total) by mouth daily. (Patient not taking: Reported on 06/23/2020) 90 tablet 3  . diclofenac (VOLTAREN) 75 MG EC  tablet  (Patient not taking: Reported on 06/23/2020)    . EPINEPHrine 0.3 mg/0.3 mL IJ SOAJ injection epinephrine 0.3 mg/0.3 mL injection, auto-injector (Patient not taking: Reported on 07/13/2020)     No current facility-administered medications on file prior to visit.   ROS as in subjective   Objective: BP (!) 150/84   Pulse (!) 57   Ht 5' 11.5" (1.816 m)   Wt 210 lb 9.6 oz (95.5 kg)   SpO2 98%   BMI 28.96 kg/m   Gen: wd,wn ,nad Abdomen: +bs, soft, nontender, no mass, no organomegaly Back: nontender, no pain with ROM, no spasm GU declined     Assessment: Encounter Diagnoses  Name Primary?  . Flank pain Yes  . History of urinary tract infection   . Microscopic hematuria      Plan: Discussed possible diagnoses.   Begin  Macrobid empirically.  Will send urine for culture.  If not improving, will send for KUB upright xray. He declines this today.  Urine micro with no RBCs or crystals, false positive urinalysis showing hematuria   Daniel [name] was seen today for flank pain.  Diagnoses and all orders for this visit:  Flank pain -     Urine Culture -     DG Abd 1 View; Future  History of urinary tract infection -     Urine Culture  Microscopic hematuria -     Urine Culture  Other orders -     nitrofurantoin, macrocrystal-monohydrate, (MACROBID) 100 MG capsule; Take 1 capsule (100 mg total) by mouth 2 (two) times daily.   F/u pending culture

## 2020-07-14 LAB — URINE CULTURE: Organism ID, Bacteria: NO GROWTH

## 2020-07-16 DIAGNOSIS — J3089 Other allergic rhinitis: Secondary | ICD-10-CM | POA: Diagnosis not present

## 2020-07-16 DIAGNOSIS — J3081 Allergic rhinitis due to animal (cat) (dog) hair and dander: Secondary | ICD-10-CM | POA: Diagnosis not present

## 2020-07-20 DIAGNOSIS — J3089 Other allergic rhinitis: Secondary | ICD-10-CM | POA: Diagnosis not present

## 2020-07-20 DIAGNOSIS — J3081 Allergic rhinitis due to animal (cat) (dog) hair and dander: Secondary | ICD-10-CM | POA: Diagnosis not present

## 2020-08-05 DIAGNOSIS — J3089 Other allergic rhinitis: Secondary | ICD-10-CM | POA: Diagnosis not present

## 2020-08-06 ENCOUNTER — Encounter: Payer: Self-pay | Admitting: Dermatology

## 2020-08-06 ENCOUNTER — Other Ambulatory Visit: Payer: Self-pay

## 2020-08-06 ENCOUNTER — Ambulatory Visit (INDEPENDENT_AMBULATORY_CARE_PROVIDER_SITE_OTHER): Payer: Medicare PPO | Admitting: Dermatology

## 2020-08-06 DIAGNOSIS — C44511 Basal cell carcinoma of skin of breast: Secondary | ICD-10-CM

## 2020-08-06 NOTE — Patient Instructions (Signed)

## 2020-08-06 NOTE — Progress Notes (Signed)
Curet cautery 38fu 1.5 cm tx size

## 2020-08-20 DIAGNOSIS — J3081 Allergic rhinitis due to animal (cat) (dog) hair and dander: Secondary | ICD-10-CM | POA: Diagnosis not present

## 2020-08-20 DIAGNOSIS — J3089 Other allergic rhinitis: Secondary | ICD-10-CM | POA: Diagnosis not present

## 2020-08-24 ENCOUNTER — Telehealth: Payer: Self-pay | Admitting: Medical

## 2020-08-24 NOTE — Telephone Encounter (Signed)
Pt called and states he can not get in touch with dr Earlean Shawl and he would like to get another referral to [facility] informed pt that shane was out of the office this week

## 2020-08-27 NOTE — Telephone Encounter (Signed)
Referral has been changed to [facility] per patient request.

## 2020-09-01 NOTE — Progress Notes (Signed)
   Follow-Up Visit   Subjective  Daniel [name] is a 74 y.o. male who presents for the following: Procedure (here for tx left breast-bcc x 1).  BCC Location: Left chest Duration:  Quality:  Associated Signs/Symptoms: Modifying Factors:  Severity:  Timing: Context: For treatment  Objective  Well appearing patient in no apparent distress; mood and affect are within normal limits.  All skin waist up examined.   Assessment & Plan    Basal cell carcinoma (BCC) of skin of left breast Left Breast  Destruction of lesion Complexity: simple   Destruction method: electrodesiccation and curettage   Informed consent: discussed and consent obtained   Timeout:  patient name, date of birth, surgical site, and procedure verified Anesthesia: the lesion was anesthetized in a standard fashion   Anesthetic:  1% lidocaine w/ epinephrine 1-100,000 local infiltration Curettage performed in three different directions: Yes   Curettage cycles:  3 Lesion length (cm):  1.5 Lesion width (cm):  1 Margin per side (cm):  0 Final wound size (cm):  1.5 Hemostasis achieved with:  ferric subsulfate Outcome: patient tolerated procedure well with no complications   Post-procedure details: wound care instructions given   Additional details:  Inoculated with parenteral 5% fluorouracil     I, Lavonna Monarch, MD, have reviewed all documentation for this visit.  The documentation on 09/01/20 for the exam, diagnosis, procedures, and orders are all accurate and complete.

## 2020-09-02 DIAGNOSIS — J3089 Other allergic rhinitis: Secondary | ICD-10-CM | POA: Diagnosis not present

## 2020-09-04 DIAGNOSIS — H1045 Other chronic allergic conjunctivitis: Secondary | ICD-10-CM | POA: Diagnosis not present

## 2020-09-04 DIAGNOSIS — R059 Cough, unspecified: Secondary | ICD-10-CM | POA: Diagnosis not present

## 2020-09-04 DIAGNOSIS — J3089 Other allergic rhinitis: Secondary | ICD-10-CM | POA: Diagnosis not present

## 2020-09-14 ENCOUNTER — Encounter: Payer: Self-pay | Admitting: Internal Medicine

## 2020-09-17 DIAGNOSIS — J3081 Allergic rhinitis due to animal (cat) (dog) hair and dander: Secondary | ICD-10-CM | POA: Diagnosis not present

## 2020-09-17 DIAGNOSIS — J3089 Other allergic rhinitis: Secondary | ICD-10-CM | POA: Diagnosis not present

## 2020-09-27 ENCOUNTER — Other Ambulatory Visit: Payer: Self-pay | Admitting: Family Medicine

## 2020-09-27 DIAGNOSIS — E785 Hyperlipidemia, unspecified: Secondary | ICD-10-CM

## 2020-09-27 DIAGNOSIS — I10 Essential (primary) hypertension: Secondary | ICD-10-CM

## 2020-10-01 DIAGNOSIS — J3089 Other allergic rhinitis: Secondary | ICD-10-CM | POA: Diagnosis not present

## 2020-10-16 DIAGNOSIS — J3081 Allergic rhinitis due to animal (cat) (dog) hair and dander: Secondary | ICD-10-CM | POA: Diagnosis not present

## 2020-10-16 DIAGNOSIS — J3089 Other allergic rhinitis: Secondary | ICD-10-CM | POA: Diagnosis not present

## 2020-10-20 ENCOUNTER — Other Ambulatory Visit: Payer: Self-pay

## 2020-10-20 ENCOUNTER — Ambulatory Visit (AMBULATORY_SURGERY_CENTER): Payer: Self-pay

## 2020-10-20 VITALS — Ht 71.5 in | Wt 211.0 lb

## 2020-10-20 DIAGNOSIS — Z1211 Encounter for screening for malignant neoplasm of colon: Secondary | ICD-10-CM

## 2020-10-20 MED ORDER — SUTAB 1479-225-188 MG PO TABS
12.0000 | ORAL_TABLET | ORAL | 0 refills | Status: DC
Start: 2020-10-20 — End: 2021-01-13

## 2020-10-20 NOTE — Progress Notes (Signed)

## 2020-10-30 DIAGNOSIS — J3089 Other allergic rhinitis: Secondary | ICD-10-CM | POA: Diagnosis not present

## 2020-10-30 DIAGNOSIS — J301 Allergic rhinitis due to pollen: Secondary | ICD-10-CM | POA: Diagnosis not present

## 2020-11-02 DIAGNOSIS — M5136 Other intervertebral disc degeneration, lumbar region: Secondary | ICD-10-CM | POA: Diagnosis not present

## 2020-11-02 DIAGNOSIS — M533 Sacrococcygeal disorders, not elsewhere classified: Secondary | ICD-10-CM | POA: Diagnosis not present

## 2020-11-12 ENCOUNTER — Encounter: Payer: Self-pay | Admitting: Internal Medicine

## 2020-11-12 ENCOUNTER — Other Ambulatory Visit: Payer: Self-pay

## 2020-11-12 ENCOUNTER — Ambulatory Visit (AMBULATORY_SURGERY_CENTER): Payer: Medicare PPO | Admitting: Internal Medicine

## 2020-11-12 VITALS — BP 147/80 | HR 56 | Temp 97.6°F | Resp 14 | Ht 71.0 in | Wt 211.0 lb

## 2020-11-12 DIAGNOSIS — Z1211 Encounter for screening for malignant neoplasm of colon: Secondary | ICD-10-CM | POA: Diagnosis not present

## 2020-11-12 MED ORDER — SODIUM CHLORIDE 0.9 % IV SOLN: 500.0000 mL | Freq: Once | INTRAVENOUS | Status: DC

## 2020-11-12 NOTE — Patient Instructions (Signed)
Please see handouts given to you on Diverticulosis. Thank you for letting us take care of your healthcare needs today.   YOU HAD AN ENDOSCOPIC PROCEDURE TODAY AT [facility]:   Refer to the procedure report that was given to you for any specific questions about what was found during the examination.  If the procedure report does not answer your questions, please call your gastroenterologist to clarify.  If you requested that your care partner not be given the details of your procedure findings, then the procedure report has been included in a sealed envelope for you to review at your convenience later.  YOU SHOULD EXPECT: Some feelings of bloating in the abdomen. Passage of more gas than usual.  Walking can help get rid of the air that was put into your GI tract during the procedure and reduce the bloating. If you had a lower endoscopy (such as a colonoscopy or flexible sigmoidoscopy) you may notice spotting of blood in your stool or on the toilet paper. If you underwent a bowel prep for your procedure, you may not have a normal bowel movement for a few days.  Please Note:  You might notice some irritation and congestion in your nose or some drainage.  This is from the oxygen used during your procedure.  There is no need for concern and it should clear up in a day or so.  SYMPTOMS TO REPORT IMMEDIATELY:   Following lower endoscopy (colonoscopy or flexible sigmoidoscopy):  Excessive amounts of blood in the stool  Significant tenderness or worsening of abdominal pains  Swelling of the abdomen that is new, acute  Fever of 100F or higher   For urgent or emergent issues, a gastroenterologist can be reached at any hour by calling [phone]. Do not use MyChart messaging for urgent concerns.    DIET:  We do recommend a small meal at first, but then you may proceed to your regular diet.  Drink plenty of fluids but you should avoid alcoholic beverages for 24 hours.  ACTIVITY:   You should plan to take it easy for the rest of today and you should NOT DRIVE or use heavy machinery until tomorrow (because of the sedation medicines used during the test).    FOLLOW UP: Our staff will call the number listed on your records 48-72 hours following your procedure to check on you and address any questions or concerns that you may have regarding the information given to you following your procedure. If we do not reach you, we will leave a message.  We will attempt to reach you two times.  During this call, we will ask if you have developed any symptoms of COVID 19. If you develop any symptoms (ie: fever, flu-like symptoms, shortness of breath, cough etc.) before then, please call [phone].  If you test positive for Covid 19 in the 2 weeks post procedure, please call and report this information to [place].    If any biopsies were taken you will be contacted by phone or by letter within the next 1-3 weeks.  Please call us at [phone] if you have not heard about the biopsies in 3 weeks.    SIGNATURES/CONFIDENTIALITY: You and/or your care partner have signed paperwork which will be entered into your electronic medical record.  These signatures attest to the fact that that the information above on your After Visit Summary has been reviewed and is understood.  Full responsibility of the confidentiality of this discharge information lies with  you and/or your care-partner.

## 2020-11-12 NOTE — Progress Notes (Signed)
pt tolerated well. VSS. awake and to recovery. Report given to RN.  

## 2020-11-12 NOTE — Op Note (Signed)
[place] Patient Name: Daniel [name] Procedure Date: 11/12/2020 8:17 AM MRN: [id number] Endoscopist: Docia Chuck. Henrene Pastor , MD Age: 74 Referring MD:  Date of Birth: Mar 12, 1946 Gender: Male Account #: [id number] Procedure:                Colonoscopy Indications:              Colon cancer screening. Average risk. Previous                            examinations 2004, 2009, 2014 Medicines:                Monitored Anesthesia Care Procedure:                Pre-Anesthesia Assessment:                           - Prior to the procedure, a History and Physical                            was performed, and patient medications and                            allergies were reviewed. The patient's tolerance of                            previous anesthesia was also reviewed. The risks                            and benefits of the procedure and the sedation                            options and risks were discussed with the patient.                            All questions were answered, and informed consent                            was obtained. Prior Anticoagulants: The patient has                            taken no previous anticoagulant or antiplatelet                            agents. After reviewing the risks and benefits, the                            patient was deemed in satisfactory condition to                            undergo the procedure.                           After obtaining informed consent, the colonoscope  was passed under direct vision. Throughout the                            procedure, the patient's blood pressure, pulse, and                            oxygen saturations were monitored continuously. The                            Olympus CF-HQ190 [id number]) Colonoscope was                            introduced through the anus and advanced to the the                            cecum, identified by appendiceal orifice and                             ileocecal valve. The ileocecal valve, appendiceal                            orifice, and rectum were photographed. The quality                            of the bowel preparation was excellent. The                            colonoscopy was performed without difficulty. The                            patient tolerated the procedure well. The bowel                            preparation used was SUPREP via split dose                            instruction. Scope In: 8:35:36 AM Scope Out: 8:53:21 AM Scope Withdrawal Time: 0 hours 10 minutes 56 seconds  Total Procedure Duration: 0 hours 17 minutes 45 seconds  Findings:                 A few small-mouthed diverticula were found in the                            sigmoid colon.                           The entire examined colon appeared normal on direct                            and retroflexion views. Complications:            No immediate complications. Estimated blood loss:                            None. Estimated Blood  Loss:     Estimated blood loss: none. Impression:               - Diverticulosis in the sigmoid colon.                           - The entire examined colon is normal on direct and                            retroflexion views.                           - No specimens collected. Recommendation:           - Repeat colonoscopy is not recommended for                            surveillance.                           - Patient has a contact number available for                            emergencies. The signs and symptoms of potential                            delayed complications were discussed with the                            patient. Return to normal activities tomorrow.                            Written discharge instructions were provided to the                            patient.                           - Resume previous diet.                           - Continue present medications. Docia Chuck. Henrene Pastor, MD 11/12/2020 9:10:18 AM This report has been signed electronically.

## 2020-11-13 DIAGNOSIS — J3081 Allergic rhinitis due to animal (cat) (dog) hair and dander: Secondary | ICD-10-CM | POA: Diagnosis not present

## 2020-11-13 DIAGNOSIS — J3089 Other allergic rhinitis: Secondary | ICD-10-CM | POA: Diagnosis not present

## 2020-11-16 ENCOUNTER — Telehealth: Payer: Self-pay

## 2020-11-16 DIAGNOSIS — M545 Low back pain, unspecified: Secondary | ICD-10-CM | POA: Diagnosis not present

## 2020-11-16 NOTE — Telephone Encounter (Signed)
Left message on follow up call. 

## 2020-11-16 NOTE — Telephone Encounter (Signed)
  Follow up Call-  Call back number 11/12/2020  Post procedure Call Back phone  # [phone]  Permission to leave phone message Yes  Some recent data might be hidden     Patient questions:  Do you have a fever, pain , or abdominal swelling? No. Pain Score  0 *  Have you tolerated food without any problems? Yes.    Have you been able to return to your normal activities? Yes.    Do you have any questions about your discharge instructions: Diet   No. Medications  No. Follow up visit  No.  Do you have questions or concerns about your Care? No.  Actions: * If pain score is 4 or above: No action needed, pain <4.  1. Have you developed a fever since your procedure? no  2.   Have you had an respiratory symptoms (SOB or cough) since your procedure? no  3.   Have you tested positive for COVID 19 since your procedure no  4.   Have you had any family members/close contacts diagnosed with the COVID 19 since your procedure?  no   If yes to any of these questions please route to Joylene John, RN and Joella Prince, RN

## 2020-11-26 DIAGNOSIS — J3089 Other allergic rhinitis: Secondary | ICD-10-CM | POA: Diagnosis not present

## 2020-11-26 DIAGNOSIS — J301 Allergic rhinitis due to pollen: Secondary | ICD-10-CM | POA: Diagnosis not present

## 2020-12-02 DIAGNOSIS — M545 Low back pain, unspecified: Secondary | ICD-10-CM | POA: Diagnosis not present

## 2020-12-11 DIAGNOSIS — J3081 Allergic rhinitis due to animal (cat) (dog) hair and dander: Secondary | ICD-10-CM | POA: Diagnosis not present

## 2020-12-11 DIAGNOSIS — J301 Allergic rhinitis due to pollen: Secondary | ICD-10-CM | POA: Diagnosis not present

## 2020-12-11 DIAGNOSIS — J3089 Other allergic rhinitis: Secondary | ICD-10-CM | POA: Diagnosis not present

## 2020-12-23 DIAGNOSIS — M47896 Other spondylosis, lumbar region: Secondary | ICD-10-CM | POA: Diagnosis not present

## 2020-12-23 DIAGNOSIS — I10 Essential (primary) hypertension: Secondary | ICD-10-CM | POA: Diagnosis not present

## 2020-12-23 DIAGNOSIS — Z6829 Body mass index (BMI) 29.0-29.9, adult: Secondary | ICD-10-CM | POA: Diagnosis not present

## 2020-12-25 DIAGNOSIS — J3089 Other allergic rhinitis: Secondary | ICD-10-CM | POA: Diagnosis not present

## 2020-12-29 ENCOUNTER — Other Ambulatory Visit: Payer: Self-pay

## 2020-12-29 ENCOUNTER — Ambulatory Visit (INDEPENDENT_AMBULATORY_CARE_PROVIDER_SITE_OTHER): Payer: Medicare PPO | Admitting: Dermatology

## 2020-12-29 ENCOUNTER — Encounter: Payer: Self-pay | Admitting: Dermatology

## 2020-12-29 DIAGNOSIS — L821 Other seborrheic keratosis: Secondary | ICD-10-CM

## 2020-12-29 DIAGNOSIS — L570 Actinic keratosis: Secondary | ICD-10-CM

## 2020-12-29 DIAGNOSIS — L304 Erythema intertrigo: Secondary | ICD-10-CM | POA: Diagnosis not present

## 2020-12-29 DIAGNOSIS — L729 Follicular cyst of the skin and subcutaneous tissue, unspecified: Secondary | ICD-10-CM | POA: Diagnosis not present

## 2021-01-01 ENCOUNTER — Other Ambulatory Visit: Payer: Self-pay | Admitting: Medical

## 2021-01-01 DIAGNOSIS — I10 Essential (primary) hypertension: Secondary | ICD-10-CM

## 2021-01-01 DIAGNOSIS — E785 Hyperlipidemia, unspecified: Secondary | ICD-10-CM

## 2021-01-05 ENCOUNTER — Telehealth: Payer: Self-pay | Admitting: Internal Medicine

## 2021-01-05 ENCOUNTER — Other Ambulatory Visit: Payer: Self-pay | Admitting: Medical

## 2021-01-05 DIAGNOSIS — I10 Essential (primary) hypertension: Secondary | ICD-10-CM

## 2021-01-05 DIAGNOSIS — E785 Hyperlipidemia, unspecified: Secondary | ICD-10-CM

## 2021-01-05 MED ORDER — ROSUVASTATIN CALCIUM 40 MG PO TABS: 40.0000 mg | ORAL_TABLET | Freq: Every day | ORAL | 0 refills | Status: DC

## 2021-01-05 MED ORDER — EZETIMIBE 10 MG PO TABS: ORAL_TABLET | ORAL | 0 refills | Status: DC

## 2021-01-05 MED ORDER — METOPROLOL SUCCINATE ER 50 MG PO TB24: ORAL_TABLET | ORAL | 0 refills | Status: DC

## 2021-01-05 NOTE — Telephone Encounter (Signed)
Pt scheduled an fasting med check coming up but needs a refill before his appt

## 2021-01-06 DIAGNOSIS — J3081 Allergic rhinitis due to animal (cat) (dog) hair and dander: Secondary | ICD-10-CM | POA: Diagnosis not present

## 2021-01-06 DIAGNOSIS — J3089 Other allergic rhinitis: Secondary | ICD-10-CM | POA: Diagnosis not present

## 2021-01-08 ENCOUNTER — Encounter: Payer: Self-pay | Admitting: Dermatology

## 2021-01-08 NOTE — Progress Notes (Signed)
   Follow-Up Visit   Subjective  Daniel [name] is a 75 y.o. male who presents for the following: Follow-up (NEW SPOT TO CHECK NEAR OLD BCC ON CHEST, ALSO LEFT NOSE SCALE +PICK AND BLEED/WANTS REFILL OF TAC FOR BACK RASH THAT COMES AND GOES/LEFT GROIN JOCK [place]? ).  New growth chest, history of itching groin and rash on back Location:  Duration:  Quality:  Associated Signs/Symptoms: Modifying Factors: Triamcinolone cream helps back but he also does use in groin. Severity:  Timing: Context:   Objective  Well appearing patient in no apparent distress; mood and affect are within normal limits. Objective  Left Breast: Flattopped brown textured 6 mm papule  Objective  Left Nasal Sidewall: 2 mm milia-like cyst  Objective  Left Genitocrural Fold, Mons Pubis (2), Right Genitocrural Fold: Mild erythema without margination, pustules, or satellite lesions. Irritant dermatitis.  Objective  Left Malar Cheek (3): Four millimeter hornlike crusts    A full examination was performed including scalp, head, eyes, ears, nose, lips, neck, chest, axillae, abdomen, back, buttocks, bilateral upper extremities, bilateral lower extremities, hands, feet, fingers, toes, fingernails, and toenails. All findings within normal limits unless otherwise noted below.   Assessment & Plan    Seborrheic keratosis Left Breast  Leave if stable  Cyst of skin Left Nasal Sidewall  Patient may choose to remove in future  Intertrigo (4) Left Genitocrural Fold; Right Genitocrural Fold; Mons Pubis (2)  Stop Triamcinolone and get OTC 1% Hydrocortisone. Can look for over-the-counter triple paste AF to minimize flareups.  AK (actinic keratosis) (3) Left Malar Cheek  Destruction of lesion - Left Malar Cheek Complexity: simple   Destruction method: cryotherapy   Informed consent: discussed and consent obtained   Timeout:  patient name, date of birth, surgical site, and procedure verified Lesion  destroyed using liquid nitrogen: Yes   Cryotherapy cycles:  5 Outcome: patient tolerated procedure well with no complications   Post-procedure details: wound care instructions given        I, Lavonna Monarch, MD, have reviewed all documentation for this visit.  The documentation on 01/08/21 for the exam, diagnosis, procedures, and orders are all accurate and complete.

## 2021-01-11 DIAGNOSIS — M5116 Intervertebral disc disorders with radiculopathy, lumbar region: Secondary | ICD-10-CM | POA: Diagnosis not present

## 2021-01-11 DIAGNOSIS — M5416 Radiculopathy, lumbar region: Secondary | ICD-10-CM | POA: Diagnosis not present

## 2021-01-13 ENCOUNTER — Ambulatory Visit (INDEPENDENT_AMBULATORY_CARE_PROVIDER_SITE_OTHER): Payer: Medicare PPO | Admitting: Medical

## 2021-01-13 ENCOUNTER — Encounter: Payer: Self-pay | Admitting: Medical

## 2021-01-13 ENCOUNTER — Other Ambulatory Visit: Payer: Self-pay

## 2021-01-13 VITALS — BP 128/78 | HR 62 | Ht 71.0 in | Wt 222.2 lb

## 2021-01-13 DIAGNOSIS — G5790 Unspecified mononeuropathy of unspecified lower limb: Secondary | ICD-10-CM | POA: Diagnosis not present

## 2021-01-13 DIAGNOSIS — Z7185 Encounter for immunization safety counseling: Secondary | ICD-10-CM

## 2021-01-13 DIAGNOSIS — I10 Essential (primary) hypertension: Secondary | ICD-10-CM

## 2021-01-13 DIAGNOSIS — I350 Nonrheumatic aortic (valve) stenosis: Secondary | ICD-10-CM

## 2021-01-13 DIAGNOSIS — N1831 Chronic kidney disease, stage 3a: Secondary | ICD-10-CM

## 2021-01-13 DIAGNOSIS — R7301 Impaired fasting glucose: Secondary | ICD-10-CM

## 2021-01-13 DIAGNOSIS — M5030 Other cervical disc degeneration, unspecified cervical region: Secondary | ICD-10-CM

## 2021-01-13 DIAGNOSIS — N400 Enlarged prostate without lower urinary tract symptoms: Secondary | ICD-10-CM

## 2021-01-13 DIAGNOSIS — E042 Nontoxic multinodular goiter: Secondary | ICD-10-CM | POA: Diagnosis not present

## 2021-01-13 DIAGNOSIS — M4180 Other forms of scoliosis, site unspecified: Secondary | ICD-10-CM

## 2021-01-13 DIAGNOSIS — K760 Fatty (change of) liver, not elsewhere classified: Secondary | ICD-10-CM

## 2021-01-13 DIAGNOSIS — M48061 Spinal stenosis, lumbar region without neurogenic claudication: Secondary | ICD-10-CM

## 2021-01-13 DIAGNOSIS — M5416 Radiculopathy, lumbar region: Secondary | ICD-10-CM

## 2021-01-13 DIAGNOSIS — N289 Disorder of kidney and ureter, unspecified: Secondary | ICD-10-CM

## 2021-01-13 DIAGNOSIS — E785 Hyperlipidemia, unspecified: Secondary | ICD-10-CM

## 2021-01-13 DIAGNOSIS — Z0000 Encounter for general adult medical examination without abnormal findings: Secondary | ICD-10-CM | POA: Insufficient documentation

## 2021-01-13 DIAGNOSIS — I6523 Occlusion and stenosis of bilateral carotid arteries: Secondary | ICD-10-CM

## 2021-01-13 DIAGNOSIS — Z7189 Other specified counseling: Secondary | ICD-10-CM

## 2021-01-13 NOTE — Progress Notes (Signed)
Subjective: Chief Complaint  Patient presents with  . Medication Management   Here for med check  Primary Care Provider Jeanne Diefendorf, Camelia Eng, PA-C here for primary care  Current Health Care Team:  Dentist, Dr. Georgana Curio doctor, Dr. Nicki Reaper   Dr. Susa Day, orthopedics  Dr. Quay Burow, cardiology  Dr. Lavonna Monarch, dermatology  Dr. [name] and /or Dr. Scarlette Shorts, Gastroenterology  Had allergy issue yesterday, went for run, and got real sneezy.  Did some benadryl, sneezed a lot and cleared up by this morning.  Chronic back pain, radicular pain - Monday of this week saw Dr. Ellene Route at neurosurgery for [facility] ([place] in coccyx region and lumbar region.   Uses Lyrica every few days, not daily.  Avoiding NSAIDs.  HTN - compliant with medication  hyperlipidemia- compliant with medication  He has his echocardiogram coming up soon with cardiology, thyroid ultrasound repeat in summer.  No other aggravating or relieving factors.    No other c/o.  Past Medical History:  Diagnosis Date  . Abdominal pain    with coffee  . Abdominal ultrasound, abnormal 12/09/09   fatty liver disease  . Abnormal carotid ultrasound 03/01/07   R no ICA stenosis. 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 Allergy    allergy shots, cat allergy; Dr. Mosetta Anis;  . Arthritis    osteoarthritis back/spine  . Basal cell carcinoma 06/15/2020   Left Breast (nodular)  . Diverticulosis   . Dyslipidemia   . Dysplastic nevus 03/01/1993   slight, left back and mid lower back, no treatment  . Dysplastic nevus 03/04/2004   moderate-marked, low mid back, excision  . Dysplastic nevus 06/14/2010   mild-mod, left hip, wider shave  . Fatty liver   . H/O CT scan of abdomen 03/20/12   abdomen/pelvis - normal  . H/O CT scan of chest 03/20/12   normal  . H/O diagnostic ultrasound 03/06/11   Aortic ultrasound - no AAA identified  . H/O echocardiogram 10/18/2011   transthoracic - mild concentric  LVH, EF 55%, mild valvular aortic stenosis, LA mild to moderate dilation, otherwise normal echo; Dr. Rollene Fare  . Heart murmur, aortic    cardiac eval 09/2011, Dr. Gwenlyn Found [facility]), mild aortic stenosis  . Hyperlipidemia   . Hypertension   . Inguinal hernia 2012   right  . Normal cardiac stress test 03/02/07   Myoview, EF 65%; Dr. Golden Hurter  . SCC (squamous cell carcinoma) 09/17/2019   well diff, left sideburn, Cx3, 5FU  . Statin intolerance    intolerance with Lipitor  . Testicular pain 01/08/10   Urology eval, Dr. Alinda Money   Current Outpatient Medications on File Prior to Visit  Medication Sig Dispense Refill  . aspirin EC 81 MG tablet Take 1 tablet (81 mg total) by mouth daily. 90 tablet 3  . EPINEPHrine 0.3 mg/0.3 mL IJ SOAJ injection     . ezetimibe (ZETIA) 10 MG tablet TAKE 1 TABLET(10 MG) BY MOUTH DAILY 30 tablet 0  . hydrochlorothiazide (HYDRODIURIL) 12.5 MG tablet TAKE 1 TABLET(12.5 MG) BY MOUTH DAILY 90 tablet 3  . metoprolol succinate (TOPROL-XL) 50 MG 24 hr tablet Take with or immediately following a meal. 30 tablet 0  . Multiple Vitamin (MULTIVITAMIN) tablet Take 1 tablet by mouth daily.    . pregabalin (LYRICA) 75 MG capsule Take 75 mg by mouth daily.    . rosuvastatin (CRESTOR) 40 MG tablet TAKE 1 TABLET(40 MG) BY MOUTH DAILY 90 tablet 0  No current facility-administered medications on file prior to visit.     The following portions of the patient's history were reviewed and updated as appropriate: allergies, current medications, past family history, past medical history, past social history, past surgical history and problem list.  ROS Otherwise as in subjective above  Objective: BP 128/78   Pulse 62   Ht 5\' 11"  (1.803 m)   Wt 222 lb 3.2 oz (100.8 kg)   SpO2 98%   BMI 30.99 kg/m   General appearance: alert, no distress, well developed, well nourished HEENT: normocephalic, but pink inflamed cheeks and chin behind mask but rest of face normal appearing,  otherwise sclerae anicteric, conjunctiva pink and moist, TMs pearly, nares patent, no discharge or erythema, pharynx normal Oral cavity: MMM, no lesions Neck: supple, no lymphadenopathy, no thyromegaly, no masses Heart: 2/6 holosystolic murmur heard in upper sternal borders, otherwise RRR, normal S1, S2 Lungs: CTA bilaterally, no wheezes, rhonchi, or rales Pulses: 2+ radial pulses, 2+ pedal pulses, normal cap refill Ext: no edema   Assessment: Encounter Diagnoses  Name Primary?  . Stage 3a chronic kidney disease ([place])   . Impaired fasting blood sugar Yes  . Vaccine counseling   . Spinal stenosis of lumbar region, unspecified whether neurogenic claudication present   . Neuropathy of lower extremity, unspecified laterality   . Other form of scoliosis, unspecified spinal region   . Multinodular thyroid   . Lumbar radiculopathy   . Impaired renal function   . Fatty liver   . Essential hypertension, benign   . Dyslipidemia   . DDD (degenerative disc disease), cervical   . Benign prostatic hyperplasia, unspecified whether lower urinary tract symptoms present   . Atherosclerosis of both carotid arteries   . Aortic valve stenosis, etiology of cardiac valve disease unspecified   . Advanced directives, counseling/discussion      Plan: CKD 3a -chronic kidney disease 3a - lab surveillance today Due to abnormal kidney function, and in order to protect your kidneys, I recommend you avoid medications that can harm the kidneys such as ibuprofen, Aleve, Advil, Motrin, Naprosyn, or prescription anti-inflammatories which are used for pain, inflammation, and arthritis.   You should avoid dehydration which can harm the kidneys.  Impaired fasting glucose-fasting BMET lab today  Chronic back pain, neuropathy-doesn't take Lyrica daily but ever few days.  Just saw neurosurgery for EDSI this week  Hypertension-continue current therapies  Dyslipidemia-continue Crestor cholesterol medicine and Zetia.   I reviewed labs from July 2021  Atherosclerosis, aortic valve stenosis, hypertension I reviewed his March 2021 echocardiogram showing EF 60-65%, moderate concentric LVH, moderately dilated left atrium, mild aortic valve stenosis, mild dilatation of the ascending aorta.  He is due for repeat echocardiogram in March 2022 through cardiology  Carotid ultrasound March 2021 showed less than 39% atherosclerosis in each left and right carotid artery  Multinodular thyroid-ultrasound results July 2021 were stable but radiologist advised annual screening for 5 years to demonstrate stability.  He will be due again in July 2022  Fatty liver disease-last ultrasound March 2018 showing fatty liver disease.  Counseled on the need to maintain a healthy weight through healthy diet and exercise and to eat a healthy low-fat diet.  I asked him to consider repeat ultrasound   Vaccines: Record shows you are due for tetanus diphtheria booster or Td vaccine Records show that you are behind on your yearly flu shot and COVID booster.  He will get [place] copy of the card showing booster.  You  are up-to-date on the initial 1 and 2 Covid vaccines, pneumococcal vaccine, shingles vaccine   Cancer screening I reviewed your December 2021 colonoscopy report from Dr. Scarlette Shorts showing diverticulosis of the sigmoid colon otherwise normal  Your last prostate cancer screening was last year July 2021 which was normal.  The [place] preventative service task force does not recommend continued PSA for cancer screening.  We sometimes check your PSA due to enlarged prostate or BPH  Monitor your skin for skin changes that could be concerning for skin cancer   Kevyn was seen today for medication management.  Diagnoses and all orders for this visit:  Impaired fasting blood sugar  Stage 3a chronic kidney disease ([place]) -     Basic metabolic panel  Vaccine counseling  Spinal stenosis of lumbar region, unspecified whether  neurogenic claudication present  Neuropathy of lower extremity, unspecified laterality  Other form of scoliosis, unspecified spinal region  Multinodular thyroid  Lumbar radiculopathy  Impaired renal function  Fatty liver  Essential hypertension, benign  Dyslipidemia  DDD (degenerative disc disease), cervical  Benign prostatic hyperplasia, unspecified whether lower urinary tract symptoms present  Atherosclerosis of both carotid arteries  Aortic valve stenosis, etiology of cardiac valve disease unspecified  Advanced directives, counseling/discussion    Follow up: pending labs, July for well visit

## 2021-01-13 NOTE — Patient Instructions (Signed)
CKD 3a -chronic kidney disease 3a - lab surveillance today Due to abnormal kidney function, and in order to protect your kidneys, I recommend you avoid medications that can harm the kidneys such as ibuprofen, Aleve, Advil, Motrin, Naprosyn, or prescription anti-inflammatories which are used for pain, inflammation, and arthritis.   You should avoid dehydration which can harm the kidneys.  Impaired fasting glucose-fasting BMET lab today  Chronic back pain, neuropathy-doesn't take Lyrica daily but ever few days.  Just saw neurosurgery for EDSI this week  Hypertension-continue current therapies  Dyslipidemia-continue Crestor cholesterol medicine and Zetia.  I reviewed labs from July 2021  Atherosclerosis, aortic valve stenosis, hypertension I reviewed his March 2021 echocardiogram showing EF 60-65%, moderate concentric LVH, moderately dilated left atrium, mild aortic valve stenosis, mild dilatation of the ascending aorta.  He is due for repeat echocardiogram in March 2022 through cardiology  Carotid ultrasound March 2021 showed less than 39% atherosclerosis in each left and right carotid artery  Multinodular thyroid-ultrasound results July 2021 were stable but radiologist advised annual screening for 5 years to demonstrate stability.  He will be due again in July 2022  Fatty liver disease-last ultrasound March 2018 showing fatty liver disease.  Counseled on the need to maintain a healthy weight through healthy diet and exercise and to eat a healthy low-fat diet.  I asked him to consider repeat ultrasound   Vaccines: Record shows you are due for tetanus diphtheria booster or Td vaccine Records show that you are behind on your yearly flu shot and COVID booster.  He will get [place] copy of the card showing booster.  You are up-to-date on the initial 1 and 2 Covid vaccines, pneumococcal vaccine, shingles vaccine   Cancer screening I reviewed your December 2021 colonoscopy report from Dr. Scarlette Shorts  showing diverticulosis of the sigmoid colon otherwise normal  Your last prostate cancer screening was last year July 2021 which was normal.  [place] preventative service task force does not recommend continued PSA for cancer screening.  We sometimes check your PSA due to enlarged prostate or BPH  Monitor your skin for skin changes that could be concerning for skin cancer

## 2021-01-14 LAB — BASIC METABOLIC PANEL
BUN/Creatinine Ratio: 17 (ref 10–24)
BUN: 22 mg/dL (ref 8–27)
CO2: 21 mmol/L (ref 20–29)
Calcium: 9.6 mg/dL (ref 8.6–10.2)
Chloride: 108 mmol/L — ABNORMAL HIGH (ref 96–106)
Creatinine, Ser: 1.32 mg/dL — ABNORMAL HIGH (ref 0.76–1.27)
GFR calc Af Amer: 62 mL/min/{1.73_m2} (ref >59)
GFR calc non Af Amer: 54 mL/min/{1.73_m2} — ABNORMAL LOW (ref >59)
Glucose: 112 mg/dL — ABNORMAL HIGH (ref 65–99)
Potassium: 4.8 mmol/L (ref 3.5–5.2)
Sodium: 145 mmol/L — ABNORMAL HIGH (ref 134–144)

## 2021-01-16 DIAGNOSIS — J3089 Other allergic rhinitis: Secondary | ICD-10-CM | POA: Diagnosis not present

## 2021-01-19 ENCOUNTER — Ambulatory Visit: Payer: Medicare PPO | Admitting: Dermatology

## 2021-01-22 DIAGNOSIS — J3089 Other allergic rhinitis: Secondary | ICD-10-CM | POA: Diagnosis not present

## 2021-01-25 ENCOUNTER — Telehealth: Payer: Self-pay | Admitting: Cardiovascular Disease

## 2021-01-25 NOTE — Telephone Encounter (Signed)
2.28.22 spoke with wife, Carolynn Serve to leave message to have Karenann Cai call to schedule 1 yr fu w/Dr. Gwenlyn Found. lp

## 2021-02-01 ENCOUNTER — Telehealth: Payer: Self-pay | Admitting: Medical

## 2021-02-01 NOTE — Telephone Encounter (Signed)
We can refer to nutritionist.

## 2021-02-01 NOTE — Telephone Encounter (Signed)
Pt called with concerns over recent labs that showed increase in kidney function. He would like to have a referral to a dietitian to discuss changes in diet that could help with level. Pt can be reached at [phone].

## 2021-02-02 ENCOUNTER — Telehealth: Payer: Self-pay

## 2021-02-02 ENCOUNTER — Other Ambulatory Visit: Payer: Self-pay

## 2021-02-02 DIAGNOSIS — I10 Essential (primary) hypertension: Secondary | ICD-10-CM

## 2021-02-02 DIAGNOSIS — R7301 Impaired fasting glucose: Secondary | ICD-10-CM

## 2021-02-02 NOTE — Telephone Encounter (Signed)
Sent again with correct diagnosis

## 2021-02-02 NOTE — Telephone Encounter (Signed)
Pt. Called stating that he was referred to a nutritionists by [place] based on his lab work the other day. They called to schedule him and told him it would not be covered by his ins. Based on the reason for the referral that was sent to them, which was high BP and high BS. He said it was supposed to be for stage 3 kidney disease which the office said it would be covered by the ins. For that diagnosis. So if you could change the referral reason to stage 3 kidney disease.

## 2021-02-02 NOTE — Telephone Encounter (Signed)
Referral sent 

## 2021-02-04 ENCOUNTER — Ambulatory Visit (HOSPITAL_COMMUNITY): Payer: Medicare PPO | Attending: Cardiology

## 2021-02-04 ENCOUNTER — Other Ambulatory Visit: Payer: Self-pay

## 2021-02-04 DIAGNOSIS — J3089 Other allergic rhinitis: Secondary | ICD-10-CM | POA: Diagnosis not present

## 2021-02-04 DIAGNOSIS — I350 Nonrheumatic aortic (valve) stenosis: Secondary | ICD-10-CM | POA: Diagnosis not present

## 2021-02-04 DIAGNOSIS — J3081 Allergic rhinitis due to animal (cat) (dog) hair and dander: Secondary | ICD-10-CM | POA: Diagnosis not present

## 2021-02-04 LAB — ECHOCARDIOGRAM COMPLETE
AR max vel: 1.61 cm2
AV Area VTI: 1.66 cm2
AV Area mean vel: 1.58 cm2
AV Mean grad: 12 mmHg
AV Peak grad: 22.8 mmHg
Ao pk vel: 2.39 m/s
Area-P 1/2: 3.08 cm2
S' Lateral: 3 cm

## 2021-02-08 ENCOUNTER — Other Ambulatory Visit: Payer: Self-pay

## 2021-02-08 ENCOUNTER — Other Ambulatory Visit (INDEPENDENT_AMBULATORY_CARE_PROVIDER_SITE_OTHER): Payer: Medicare PPO

## 2021-02-08 ENCOUNTER — Telehealth: Payer: Self-pay | Admitting: Medical

## 2021-02-08 DIAGNOSIS — Z23 Encounter for immunization: Secondary | ICD-10-CM

## 2021-02-08 DIAGNOSIS — S61401A Unspecified open wound of right hand, initial encounter: Secondary | ICD-10-CM

## 2021-02-08 NOTE — Telephone Encounter (Signed)
Pt called and state that he cut his hand on the top of a can of dog food but does not need to come in for that but states he needs a tetanus shot, states his out of date he would like to come in for one if that is ok  Pt can be reached at [phone]

## 2021-02-08 NOTE — Telephone Encounter (Signed)
Check with Daniel [name].  Since he is Medicare I know they are particular on coverage for tetanus.  This may require an appointment because of Medicare and the ridiculous rules on tetanus vaccine coverage.  I don't have a problem with him coming in but I want to make sure we handle the billing part correctly

## 2021-02-08 NOTE — Telephone Encounter (Signed)
Please advise 

## 2021-02-15 DIAGNOSIS — G43109 Migraine with aura, not intractable, without status migrainosus: Secondary | ICD-10-CM | POA: Diagnosis not present

## 2021-02-15 DIAGNOSIS — D3132 Benign neoplasm of left choroid: Secondary | ICD-10-CM | POA: Diagnosis not present

## 2021-02-17 DIAGNOSIS — J3089 Other allergic rhinitis: Secondary | ICD-10-CM | POA: Diagnosis not present

## 2021-03-03 DIAGNOSIS — J3089 Other allergic rhinitis: Secondary | ICD-10-CM | POA: Diagnosis not present

## 2021-03-03 DIAGNOSIS — J3081 Allergic rhinitis due to animal (cat) (dog) hair and dander: Secondary | ICD-10-CM | POA: Diagnosis not present

## 2021-03-09 ENCOUNTER — Ambulatory Visit (INDEPENDENT_AMBULATORY_CARE_PROVIDER_SITE_OTHER): Payer: Medicare PPO | Admitting: Medical

## 2021-03-09 ENCOUNTER — Encounter: Payer: Self-pay | Admitting: Medical

## 2021-03-09 ENCOUNTER — Other Ambulatory Visit: Payer: Self-pay

## 2021-03-09 VITALS — BP 120/76 | HR 66 | Wt 197.6 lb

## 2021-03-09 DIAGNOSIS — M549 Dorsalgia, unspecified: Secondary | ICD-10-CM | POA: Diagnosis not present

## 2021-03-09 DIAGNOSIS — R7301 Impaired fasting glucose: Secondary | ICD-10-CM | POA: Diagnosis not present

## 2021-03-09 DIAGNOSIS — E042 Nontoxic multinodular goiter: Secondary | ICD-10-CM | POA: Diagnosis not present

## 2021-03-09 DIAGNOSIS — N1831 Chronic kidney disease, stage 3a: Secondary | ICD-10-CM

## 2021-03-09 DIAGNOSIS — I10 Essential (primary) hypertension: Secondary | ICD-10-CM

## 2021-03-09 DIAGNOSIS — M62830 Muscle spasm of back: Secondary | ICD-10-CM

## 2021-03-09 DIAGNOSIS — I350 Nonrheumatic aortic (valve) stenosis: Secondary | ICD-10-CM

## 2021-03-09 DIAGNOSIS — R634 Abnormal weight loss: Secondary | ICD-10-CM

## 2021-03-09 LAB — POCT URINALYSIS DIPSTICK (MANUAL)
Leukocytes, UA: NEGATIVE
Nitrite, UA: NEGATIVE
Poct Bilirubin: NEGATIVE
Poct Glucose: NORMAL mg/dL
Poct Ketones: NEGATIVE
Poct Urobilinogen: NORMAL mg/dL
Spec Grav, UA: 1.015 (ref 1.010–1.025)
pH, UA: 6 (ref 5.0–8.0)

## 2021-03-09 MED ORDER — CYCLOBENZAPRINE HCL 10 MG PO TABS: 10.0000 mg | ORAL_TABLET | Freq: Two times a day (BID) | ORAL | 0 refills | Status: DC | PRN

## 2021-03-09 NOTE — Progress Notes (Signed)
Subjective:  Daniel [name] is a 75 y.o. male who presents for Chief Complaint  Patient presents with  . Weight Loss    Pt is present for concerns of weight loss, fatigue and bp meds. B/p keep dropping low feel like he's going to pass out.     Current Health Care Team:  Dentist, Dr.Wilkerson  Eye doctor, Dr.Scott  Dr. Susa Day, orthopedics  Dr. Quay Burow, cardiology  Dr. Lavonna Monarch, dermatology  Dr. [name] and /or Dr. Scarlette Shorts, Gastroenterology  Oda Lansdowne, Camelia Eng, PA-C here for primary care   Here for concerns.  Has been seeing some lower BP readings.    He brought a list of BP readings with him.   Stopped Hydrochlorothiazide 3 mo ago after getting normal to low readings.  He continues on Troprol XL 50mg  daily.   In the past week, only been taking 1/2 tablet daily given low BP readings .   He notes some recent weight loss.    He had gained some weight from December til February.  Has been working on [employer], recently cut out potatatoes bananas, and other high carb foods.  Has cut out sweets, chips, pretzels, salty foods as well since last visit.    Eating plenty of fish , chicken, vegetables, milk, still eating oatmeal.    No bowel changes, no blood in stool.   No recent nausea no change in appetite.    Cant lift weights due to back spasm.  Is doing calisthenics, and step aerobics some.  Walks dog around block some.  Been having lots of back cramping lately.  Would like a refill on Flexeril that he is used in the past  No other aggravating or relieving factors.    No other c/o.  The following portions of the patient's history were reviewed and updated as appropriate: allergies, current medications, past family history, past medical history, past social history, past surgical history and problem list.  ROS Otherwise as in subjective above  Objective: BP 120/76   Pulse 66   Wt 197 lb 9.6 oz (89.6 kg)   SpO2 99%   BMI 27.56 kg/m    BP Readings from Last 3 Encounters:  03/09/21 120/76  01/13/21 128/78  11/12/20 (!) 147/80   Wt Readings from Last 3 Encounters:  03/09/21 197 lb 9.6 oz (89.6 kg)  01/13/21 222 lb 3.2 oz (100.8 kg)  11/12/20 211 lb (95.7 kg)    General appearance: alert, no distress, well developed, well nourished Left lumbar spine with positive spasm, pain with range of motion which is limited today, had pain leaning back on the exam table HEENT: normocephalic, sclerae anicteric, conjunctiva pink and moist, nares patent, no discharge or erythema, pharynx with some postnasal drainage Oral cavity: MMM, no lesions Neck: supple, no lymphadenopathy, no thyromegaly, no masses, no JVD Heart: 2+ systolic murmur heard best in right upper sternal border, otherwise RRR, normal S1, S2 Lungs: CTA bilaterally, no wheezes, rhonchi, or rales Abdomen: +bs, soft, non tender, non distended, no masses, no hepatomegaly, no splenomegaly Pulses: 2+ radial pulses, 2+ pedal pulses, normal cap refill Ext: no edema   Assessment: Encounter Diagnoses  Name Primary?  . Weight loss Yes  . Stage 3a chronic kidney disease ([place])   . Multinodular thyroid   . Impaired fasting blood sugar   . Essential hypertension, benign   . Back pain, unspecified back location, unspecified back pain laterality, unspecified chronicity   . Aortic valve stenosis, etiology of cardiac valve  disease unspecified   . Back spasm      Plan: Today he presents with acute weight loss over the last 2 months unintended.  He has lost some weight likely to dietary changes but that should not explain the totality of the weight loss.  Differential could include new onset diabetes uncontrolled.  He has been impaired glucose prior.  Differential also includes tumor, dietary changes, thyroid related changes or other.  I am concerned that we need to rule out some potential worrisome causes of weight loss unintended  Labs and CT ordered as below  Of note I  reviewed his colonoscopy from December 2021.  At that time he had diverticulosis of the sigmoid colon otherwise: Normal  Last PSA normal July 2021  Thyroid ultrasound from June 12, 2020 IMPRESSION: Multinodular thyroid again demonstrated.  Right inferior thyroid nodule (labeled 3, 1.45 cm, TR 4) meets criteria for continued surveillance, as designated by the newly established ACR TI-RADS criteria. Surveillance ultrasound study recommended to be performed annually up to 5 years.  Recommendations follow those established by the new ACR TI-RADS criteria (J Am Coll Radiol 8592;92:446-286).  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 Denna Haggard   His blood pressures have been dropping due to the weight loss.  He is only taking 25 mg of Toprol-XL currently, half tablet of the 50 mg dose.  He was on Toprol-XL 50 mg plus hydrochlorothiazide 12.5 mg just a month ago    Schneur was seen today for weight loss.  Diagnoses and all orders for this visit:  Weight loss -     Comprehensive metabolic panel -     CBC with Differential/Platelet -     Lipase -     CT CHEST ABDOMEN PELVIS W CONTRAST; Future -     POCT Urinalysis Dip Manual -     TSH + free T4  Stage 3a chronic kidney disease (HCC) -     Comprehensive metabolic panel -     CBC with Differential/Platelet -     POCT Urinalysis Dip Manual  Multinodular thyroid -     TSH + free T4  Impaired fasting blood sugar -     Comprehensive metabolic panel  Essential hypertension, benign -     Comprehensive metabolic panel  Back pain, unspecified back location, unspecified back pain laterality, unspecified chronicity  Aortic valve stenosis, etiology of cardiac valve disease unspecified  Back spasm  Other orders -     cyclobenzaprine (FLEXERIL) 10 MG tablet;  Take 1 tablet (10 mg total) by mouth 2 (two) times daily as needed.    Follow up: pending studies

## 2021-03-10 LAB — COMPREHENSIVE METABOLIC PANEL
ALT: 49 IU/L — ABNORMAL HIGH (ref 0–44)
AST: 39 IU/L (ref 0–40)
Albumin/Globulin Ratio: 2.1 (ref 1.2–2.2)
Albumin: 4.6 g/dL (ref 3.7–4.7)
Alkaline Phosphatase: 42 IU/L — ABNORMAL LOW (ref 44–121)
BUN/Creatinine Ratio: 16 (ref 10–24)
BUN: 22 mg/dL (ref 8–27)
Bilirubin Total: 0.6 mg/dL (ref 0.0–1.2)
CO2: 20 mmol/L (ref 20–29)
Calcium: 9.9 mg/dL (ref 8.6–10.2)
Chloride: 101 mmol/L (ref 96–106)
Creatinine, Ser: 1.34 mg/dL — ABNORMAL HIGH (ref 0.76–1.27)
Globulin, Total: 2.2 g/dL (ref 1.5–4.5)
Glucose: 90 mg/dL (ref 65–99)
Potassium: 4.3 mmol/L (ref 3.5–5.2)
Sodium: 143 mmol/L (ref 134–144)
Total Protein: 6.8 g/dL (ref 6.0–8.5)
eGFR: 56 mL/min/{1.73_m2} — ABNORMAL LOW (ref >59)

## 2021-03-10 LAB — CBC WITH DIFFERENTIAL/PLATELET
Basophils Absolute: 0 10*3/uL (ref 0.0–0.2)
Basos: 1 %
EOS (ABSOLUTE): 0.1 10*3/uL (ref 0.0–0.4)
Eos: 1 %
Hematocrit: 47.6 % (ref 37.5–51.0)
Hemoglobin: 16 g/dL (ref 13.0–17.7)
Immature Grans (Abs): 0 10*3/uL (ref 0.0–0.1)
Immature Granulocytes: 1 %
Lymphocytes Absolute: 1.4 10*3/uL (ref 0.7–3.1)
Lymphs: 25 %
MCH: 31.1 pg (ref 26.6–33.0)
MCHC: 33.6 g/dL (ref 31.5–35.7)
MCV: 93 fL (ref 79–97)
Monocytes Absolute: 0.7 10*3/uL (ref 0.1–0.9)
Monocytes: 12 %
Neutrophils Absolute: 3.5 10*3/uL (ref 1.4–7.0)
Neutrophils: 60 %
Platelets: 141 10*3/uL — ABNORMAL LOW (ref 150–450)
RBC: 5.14 x10E6/uL (ref 4.14–5.80)
RDW: 13.1 % (ref 11.6–15.4)
WBC: 5.7 10*3/uL (ref 3.4–10.8)

## 2021-03-10 LAB — LIPASE: Lipase: 36 U/L (ref 13–78)

## 2021-03-10 LAB — TSH+FREE T4
Free T4: 1.34 ng/dL (ref 0.82–1.77)
TSH: 1.63 u[IU]/mL (ref 0.450–4.500)

## 2021-03-18 DIAGNOSIS — J3089 Other allergic rhinitis: Secondary | ICD-10-CM | POA: Diagnosis not present

## 2021-03-19 ENCOUNTER — Encounter: Payer: Self-pay | Admitting: Dietician

## 2021-03-19 ENCOUNTER — Other Ambulatory Visit: Payer: Self-pay

## 2021-03-19 ENCOUNTER — Encounter: Payer: Medicare PPO | Attending: Medical | Admitting: Dietician

## 2021-03-19 DIAGNOSIS — N1831 Chronic kidney disease, stage 3a: Secondary | ICD-10-CM | POA: Diagnosis not present

## 2021-03-19 NOTE — Patient Instructions (Addendum)
Continue to stay active. Continue to follow a low sodium diet. Continue to limit portion size of chicken, fish, and other meat. There is no need to limit your potassium intake at this time  Avoid foods that have phos... as an ingredient. Stay hydrated. Listen to your body.

## 2021-03-19 NOTE — Progress Notes (Signed)
Medical Nutrition Therapy  Appointment Start time:  1100  Appointment End time:  1207  Primary concerns today: Patient is here today with his wife.  They would like to learn more about how to eat with CKD, slightly elevated blood glucose.  His wife is concerned that patient is being too restrictive with his diet.  He is trying to follow a low sodium, low potassium diet.  He has stopped eating cookies. Concerns of weight loss noted per chart.  Patient's diet is quite restrictive at this time with patient intent of weight loss.  Referral diagnosis: CKD Preferred learning style: no preference indicated Learning readiness: ready, change in progress   NUTRITION ASSESSMENT   Anthropometrics  6' 197 lbs (192 lbs at home nude) 03/19/21 222 lbs 12/2020.  Lost weight by avoiding sugar, decreasing portion sizes 167 lbs before the [employer] 176 lbs after the [employer] 186 lbs when he got married and UBW Starting gaining when he retired.   Clinical Medical Hx: CKD stage 3, HTN, HLD, fatty liver, neuropathy, back problems Medications: see list Labs: 03/09/2021, GFR 56, BUN 22, Creatine 1.34, potassium 4.3 (highest 4.8 prior to diet changes), ALT 49 Notable Signs/Symptoms: none  Lifestyle & Dietary Hx Patient lives with his wife.  They share cooking and shopping. He is a retired Engineer, structural.  Supplements: MVI, Vitamin B-12, co Q-10 Sleep: 7 hours Stress / self-care: good Current average weekly physical activity: daily walking or push ups, stretching, push mows his own yard  24-Hr Dietary Recall First Meal: corn chex, blueberries, skim milk or almond milk OR toast and 2 eggs OR oatmeal, berries Snack:  Second Meal: apple and green pepper,  Snack: 1/2 PB and SF jelly sandwich, skim milk or almond milk Third Meal: salmon patties, stewed cabbage, salad OR salad with chicken or salmon OR baked chicken, 2 vegetables OR vegetarian hotdog or vegeburger OR cauliflower, onions, mushrooms Snack:  celery Beverages: water, skim or almond milk, diet Pepsi  Estimated Energy Needs Calories: 2000 Protein: 60-70g   NUTRITION DIAGNOSIS  NB-1.1 Food and nutrition-related knowledge deficit As related to balance of diet for CKD.  As evidenced by diet hx and patient report.   NUTRITION INTERVENTION  Nutrition education (E-1) on the following topics:  . Protein portion control with CKD . Sodium . Potassium (only restrict if blood potassium becomes elevated) . Avoid foods with phos... in the ingredient list . Importance of adequate nutrition and nutritional balance for adequate energy and muscle . Making choices   Handouts Provided Include   NKD National Kidney diet placemat  Learning Style & Readiness for Change Teaching method utilized: Visual & Auditory  Demonstrated degree of understanding via: Teach Back  Barriers to learning/adherence to lifestyle change: none  Plan: Continue to stay active. Continue to follow a low sodium diet. Continue to limit portion size of chicken, fish, and other meat. There is no need to limit your potassium intake at this time  Avoid foods that have phos... as an ingredient. Stay hydrated. Listen to your body.   MONITORING & EVALUATION Dietary intake, weekly physical activity, prn.  Next Steps  Patient is to call for questions.

## 2021-03-23 ENCOUNTER — Encounter: Payer: Self-pay | Admitting: Cardiovascular Disease

## 2021-03-23 ENCOUNTER — Other Ambulatory Visit: Payer: Self-pay

## 2021-03-23 ENCOUNTER — Ambulatory Visit (INDEPENDENT_AMBULATORY_CARE_PROVIDER_SITE_OTHER): Payer: Medicare PPO | Admitting: Cardiovascular Disease

## 2021-03-23 DIAGNOSIS — E785 Hyperlipidemia, unspecified: Secondary | ICD-10-CM

## 2021-03-23 DIAGNOSIS — I350 Nonrheumatic aortic (valve) stenosis: Secondary | ICD-10-CM | POA: Diagnosis not present

## 2021-03-23 DIAGNOSIS — I10 Essential (primary) hypertension: Secondary | ICD-10-CM

## 2021-03-23 NOTE — Progress Notes (Signed)
03/23/2021 Daniel [name]   Nov 07, 1946  [id number]  Primary Physician Tysinger, Camelia Eng, PA-C Primary Cardiologist: [name] MD Renae Gloss  HPI:  Daniel [name] is a 75 y.o.  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  He was referred by Dr. Earlean Shawl for cardiovascular evaluation because of an auscultated murmur. He saw Dr. Golden Hurter remotely.  I last saw him in the office 01/15/2020.  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 He has rare episodes of tachy palpitations. His cardiovascular risk factor profile is positive for hypertension and hyperlipidemia but otherwise is benign. There is no family history of heart disease. He has never had a heart attack or stroke and denies chest pain or shortness of breath. He had an echo done on 2008 that showed normal LV systolic function with an aortic valve area of 1.79 cm consistent with mild aortic stenosis.   Since I saw him in the office a year ago he continues to do well.  He has lost 25 pounds as a result of diet and exercise.  He has since come off his antihypertensive medications and is normotensive at home when he checks.  He does do exercise on a daily basis.  He denies chest pain or shortness of breath.  His most recent 2D echocardiogram performed 02/04/2021 revealed normal LV size and function, grade 1 diastolic dysfunction and mild aortic stenosis with a valve area of 1.66 cm.  A peak gradient of 22 mmHg.   Current Meds  Medication Sig  . co-enzyme Q-10 30 MG capsule Take 30 mg by mouth 3 (three) times daily.  . cyclobenzaprine (FLEXERIL) 10 MG tablet Take 1 tablet (10 mg total) by mouth 2 (two) times daily as needed.  Marland Kitchen EPINEPHrine 0.3 mg/0.3 mL IJ SOAJ injection   . ezetimibe (ZETIA) 10 MG tablet TAKE 1 TABLET(10 MG) BY MOUTH DAILY  . hydrochlorothiazide (HYDRODIURIL) 12.5 MG tablet TAKE 1 TABLET(12.5  MG) BY MOUTH DAILY  . metoprolol succinate (TOPROL-XL) 50 MG 24 hr tablet Take with or immediately following a meal.  . Multiple Vitamin (MULTIVITAMIN) tablet Take 1 tablet by mouth daily.  . pregabalin (LYRICA) 75 MG capsule Take 75 mg by mouth daily.  . rosuvastatin (CRESTOR) 40 MG tablet TAKE 1 TABLET(40 MG) BY MOUTH DAILY  . vitamin B-12 (CYANOCOBALAMIN) 100 MCG tablet Take 100 mcg by mouth daily.  . [DISCONTINUED] aspirin EC 81 MG tablet Take 1 tablet (81 mg total) by mouth daily.     Allergies  Allergen Reactions  . Codeine Nausea Only and Other (See Comments)    Dizziness, sweat and will make patient pass out.  . Ampicillin     Rash   . Lipitor [Atorvastatin]     Elevated LFTs, no symptoms  . Sulfa Antibiotics Rash    Social History   Socioeconomic History  . Marital status: Married    Spouse name: Not on file  . Number of children: Not on file  . Years of education: Not on file  . Highest education level: Not on file  Occupational History  . Occupation: Presenter, broadcasting; part time [employer]    Employer: [employer]  Tobacco Use  . Smoking status: Never Smoker  . Smokeless tobacco: Never Used  Vaping Use  . Vaping Use: Never used  Substance and Sexual Activity  . Alcohol use: Yes  Alcohol/week: 2.0 standard drinks    Types: 2 Cans of beer per week    Comment: beer once or twice per week  . Drug use: No  . Sexual activity: Yes  Other Topics Concern  . Not on file  Social History Narrative   Married, no children, 2 dogs and a cat, exercise - aerobics, some weight bearing   Social Determinants of Health   Financial Resource Strain: Not on file  Food Insecurity: Not on file  Transportation Needs: Not on file  Physical Activity: Not on file  Stress: Not on file  Social Connections: Not on file  Intimate Partner Violence: Not on file     Review of Systems: General: negative for chills, fever, night sweats or weight changes.  Cardiovascular:  negative for chest pain, dyspnea on exertion, edema, orthopnea, palpitations, paroxysmal nocturnal dyspnea or shortness of breath Dermatological: negative for rash Respiratory: negative for cough or wheezing Urologic: negative for hematuria Abdominal: negative for nausea, vomiting, diarrhea, bright red blood per rectum, melena, or hematemesis Neurologic: negative for visual changes, syncope, or dizziness All other systems reviewed and are otherwise negative except as noted above.    Blood pressure 124/72, pulse 70, height 5\' 11"  (1.803 m), weight 197 lb (89.4 kg), SpO2 98 %.  General appearance: alert and no distress Neck: no adenopathy, no JVD, supple, symmetrical, trachea midline, thyroid not enlarged, symmetric, no tenderness/mass/nodules and Bilateral carotid bruits versus transmitted murmur Lungs: clear to auscultation bilaterally Heart: 2/6 outflow tract murmur consistent with aortic stenosis Extremities: extremities normal, atraumatic, no cyanosis or edema Pulses: 2+ and symmetric Skin: Skin color, texture, turgor normal. No rashes or lesions Neurologic: Alert and oriented X 3, normal strength and tone. Normal symmetric reflexes. Normal coordination and gait  EKG sinus rhythm at 70 with LVH voltage and nonspecific ST and T wave changes.  I personally reviewed this EKG.  ASSESSMENT AND PLAN:   Essential hypertension, benign History of essential hypertension a blood pressure measured today 124/72.  He was on hydrochlorothiazide and metoprolol which she has stopped over the last several months after losing 25 pounds of weight intentionally.  He no longer is hypertensive.  Dyslipidemia History of dyslipidemia intolerant to statin therapy.  He is on Zetia.  He does have a heart healthy diet.  His most recent lipid profile performed 06/16/2020 revealed total cholesterol 136, LDL 73 and HDL 45.  Aortic stenosis History of mild aortic stenosis with recent 2D echocardiogram performed  02/04/2021 revealing normal LV size and function, grade 1 diastolic dysfunction and moderate aortic stenosis with a valve area of 1.66 cm, peak gradient of 22 mmHg.  He does have a 2/6 outflow tract murmur which radiates to his carotid arteries which have been shown to be normal.  We will continue to follow his echo on annual basis.      [name] MD FACP,FACC,FAHA, [facility] 03/23/2021 9:14 AM

## 2021-03-23 NOTE — Patient Instructions (Signed)
Medication Instructions:  Your physician recommends that you continue on your current medications as directed. Please refer to the Current Medication list given to you today.  *If you need a refill on your cardiac medications before your next appointment, please call your pharmacy*   Testing/Procedures: Your physician has requested that you have an echocardiogram. Echocardiography is a painless test that uses sound waves to create images of your heart. It provides your doctor with information about the size and shape of your heart and how well your heart's chambers and valves are working. This procedure takes approximately one hour. There are no restrictions for this procedure. This procedure is done at [address]  -To be done in March 2023.     Follow-Up: At [facility], you and your health needs are our priority.  As part of our continuing mission to provide you with exceptional heart care, we have created designated Provider Care Teams.  These Care Teams include your primary Cardiologist (physician) and Advanced Practice Providers (APPs -  Physician Assistants and Nurse Practitioners) who all work together to provide you with the care you need, when you need it.  We recommend signing up for the patient portal called "MyChart".  Sign up information is provided on this After Visit Summary.  MyChart is used to connect with patients for Virtual Visits (Telemedicine).  Patients are able to view lab/test results, encounter notes, upcoming appointments, etc.  Non-urgent messages can be sent to your provider as well.   To learn more about what you can do with MyChart, go to [email or web address].    Your next appointment:   12 month(s)  The format for your next appointment:   In Person  Provider:   Jonathan Berry, MD 

## 2021-03-23 NOTE — Assessment & Plan Note (Signed)
History of dyslipidemia intolerant to statin therapy.  He is on Zetia.  He does have a heart healthy diet.  His most recent lipid profile performed 06/16/2020 revealed total cholesterol 136, LDL 73 and HDL 45.

## 2021-03-23 NOTE — Assessment & Plan Note (Signed)
History of essential hypertension a blood pressure measured today 124/72.  He was on hydrochlorothiazide and metoprolol which she has stopped over the last several months after losing 25 pounds of weight intentionally.  He no longer is hypertensive.

## 2021-03-23 NOTE — Assessment & Plan Note (Signed)
History of mild aortic stenosis with recent 2D echocardiogram performed 02/04/2021 revealing normal LV size and function, grade 1 diastolic dysfunction and moderate aortic stenosis with a valve area of 1.66 cm, peak gradient of 22 mmHg.  He does have a 2/6 outflow tract murmur which radiates to his carotid arteries which have been shown to be normal.  We will continue to follow his echo on annual basis.

## 2021-04-02 DIAGNOSIS — J3089 Other allergic rhinitis: Secondary | ICD-10-CM | POA: Diagnosis not present

## 2021-04-02 DIAGNOSIS — J3081 Allergic rhinitis due to animal (cat) (dog) hair and dander: Secondary | ICD-10-CM | POA: Diagnosis not present

## 2021-04-14 DIAGNOSIS — J3089 Other allergic rhinitis: Secondary | ICD-10-CM | POA: Diagnosis not present

## 2021-04-29 DIAGNOSIS — J3089 Other allergic rhinitis: Secondary | ICD-10-CM | POA: Diagnosis not present

## 2021-04-29 DIAGNOSIS — J3081 Allergic rhinitis due to animal (cat) (dog) hair and dander: Secondary | ICD-10-CM | POA: Diagnosis not present

## 2021-05-04 ENCOUNTER — Other Ambulatory Visit: Payer: Self-pay | Admitting: Medical

## 2021-05-04 ENCOUNTER — Telehealth: Payer: Self-pay | Admitting: Cardiovascular Disease

## 2021-05-04 DIAGNOSIS — E785 Hyperlipidemia, unspecified: Secondary | ICD-10-CM

## 2021-05-04 NOTE — Telephone Encounter (Signed)
Patient c/o Palpitations:  High priority if patient c/o lightheadedness, shortness of breath, or chest pain  1) How long have you had palpitations/irregular HR/ Afib? Are you having the symptoms now? A couple weeks, no   2) Are you currently experiencing lightheadedness, SOB or CP? No   3) Do you have a history of afib (atrial fibrillation) or irregular heart rhythm? Yes   4) Have you checked your BP or HR? (document readings if available): 112/71 on 5/28... 121/73, 113/69  Are you experiencing any other symptoms?

## 2021-05-04 NOTE — Telephone Encounter (Signed)
This RN returned patient's call, patient reports he has been experiencing heart "skipping a beat" since seeing Dr. Gwenlyn Found about 1 month ago, but in the last week or so he has had palpitations that worsen when he is lying down. Pt denies chest pain, shortness of breath, headache, or dizziness. Pt reports when he stands, the palpitations tend to subside. Pt reports he does drink decaf coffee, and per his preference has self-discontinued his metoprolol and rosuvastatin. Pt reports recent blood pressures from the end of May: 114/70, 112/71, 110/73.   This RN scheduled patient an appointment 06/04/2021 with Coletta Memos, pt reports he is also to see his PCP at the end of July. This RN advised patient to call 911 or have someone take him to the nearest emergency department should he develop chest pain, dizziness, shortness of breath, or other concerning symptoms in the interim. Patient verbalized understanding. This RN also let patient know his concerns would be forwarded to Dr. Gwenlyn Found.

## 2021-05-12 DIAGNOSIS — J3089 Other allergic rhinitis: Secondary | ICD-10-CM | POA: Diagnosis not present

## 2021-05-26 DIAGNOSIS — J3089 Other allergic rhinitis: Secondary | ICD-10-CM | POA: Diagnosis not present

## 2021-06-01 DIAGNOSIS — J3089 Other allergic rhinitis: Secondary | ICD-10-CM | POA: Diagnosis not present

## 2021-06-01 DIAGNOSIS — J3081 Allergic rhinitis due to animal (cat) (dog) hair and dander: Secondary | ICD-10-CM | POA: Diagnosis not present

## 2021-06-01 NOTE — Progress Notes (Signed)
Cardiology Clinic Note   Patient Name: Daniel [name] Date of Encounter: 06/04/2021  Primary Care Provider:  Carlena Hurl, PA-C Primary Cardiologist:  Quay Burow, MD  Patient Profile    Daniel [name] 75 year old male presents the clinic today for follow-up evaluation of his palpitations.  Past Medical History    Past Medical History:  Diagnosis Date   Abdominal pain    with coffee   Abdominal ultrasound, abnormal 12/09/09   fatty liver disease   Abnormal carotid ultrasound 03/01/07   R no ICA stenosis. 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 Mosetta Anis;   Arthritis    osteoarthritis back/spine   Basal cell carcinoma 06/15/2020   Left Breast (nodular)   Diverticulosis    Dyslipidemia    Dysplastic nevus 03/01/1993   slight, left back and mid lower back, no treatment   Dysplastic nevus 03/04/2004   moderate-marked, low mid back, excision   Dysplastic nevus 06/14/2010   mild-mod, left hip, wider shave   Fatty liver    H/O CT scan of abdomen 03/20/12   abdomen/pelvis - normal   H/O CT scan of chest 03/20/12   normal   H/O diagnostic ultrasound 03/06/11   Aortic ultrasound - no AAA identified   H/O echocardiogram 10/18/2011   transthoracic - mild concentric LVH, EF 55%, mild valvular aortic stenosis, LA mild to moderate dilation, otherwise normal echo; Dr. Rollene Fare   Heart murmur, aortic    cardiac eval 09/2011, Dr. Gwenlyn Found [facility]), mild aortic stenosis   Hyperlipidemia    Hypertension    Inguinal hernia 2012   right   Normal cardiac stress test 03/02/07   Myoview, EF 65%; Dr. 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 Alinda Money   Past Surgical History:  Procedure Laterality Date   COLONOSCOPY  10/21/2008   internal hemorrhoids, diverticulosis; repeat 10/2018;  Dr. Scarlette Shorts, [place]   HAND SURGERY  2004   left ganglion cyst removed    NOSE SURGERY  2005   obstruction, polyp and deviated septum; Dr. Omar Person SURGERY  2002, 2008   bilat, RTC    Allergies  Allergies  Allergen Reactions   Codeine Nausea Only and Other (See Comments)    Dizziness, sweat and will make patient pass out.   Ampicillin     Rash    Lipitor [Atorvastatin]     Elevated LFTs, no symptoms   Sulfa Antibiotics Rash    History of Present Illness    Daniel [name] has a PMH of essential hypertension, aortic stenosis, fatty liver, multi nodular thyroid, degenerative disc disease, skin cancer, stage III CKD, dyslipidemia, back pain, back spasms, weight loss, and palpitations.  He has a previous diagnosis of WPW which was diagnosed in 1969.  His echocardiogram 2008 showed normal LV function with aortic valve area of 1.79 cm which is consistent with mild aortic stenosis.  He notes rare episodes of tachypalpitations.  He is a retired Mining engineer.  He was seen in follow-up by Dr. Gwenlyn Found on 03/23/2021.  He continued to do well at that time.  He had lost 25 pounds with diet exercise.  He was able to come off some of his antihypertensive medications.  He reported good blood pressure control at  home.  He was exercising regularly.  He denied chest pain or shortness of breath.  His echocardiogram 02/04/2021 showed normal LV function, G1 DD, mild aortic stenosis with a valve area of 1.66 cm and a peak gradient of 22 mmHg.  He presents the clinic today for follow-up evaluation states he continues to notice palpitations at night and in the morning.  He reports that his symptoms are much better since starting metoprolol.  We reviewed triggers for palpitations.  We discussed the option of continuing to avoid triggers and increase physical activity/maintain or slightly increasing metoprolol dosing.  He wishes to take 25 mg in the morning, continue to avoid triggers, and maintain  his physical activity.  We will have him maintain a blood pressure log and follow-up in 3 months.  Today he denies chest pain, shortness of breath, lower extremity edema, fatigue, increased palpitations, melena, hematuria, hemoptysis, diaphoresis, weakness, presyncope, syncope, orthopnea, and PND.    Home Medications    Prior to Admission medications   Medication Sig Start Date End Date Taking? Authorizing Provider  co-enzyme Q-10 30 MG capsule Take 30 mg by mouth 3 (three) times daily.    [provider]  cyclobenzaprine (FLEXERIL) 10 MG tablet Take 1 tablet (10 mg total) by mouth 2 (two) times daily as needed. 03/09/21   Tysinger, Camelia Eng, PA-C  EPINEPHrine 0.3 mg/0.3 mL IJ SOAJ injection     [provider]  ezetimibe (ZETIA) 10 MG tablet TAKE 1 TABLET(10 MG) BY MOUTH DAILY 05/04/21   Tysinger, Camelia Eng, PA-C  hydrochlorothiazide (HYDRODIURIL) 12.5 MG tablet TAKE 1 TABLET(12.5 MG) BY MOUTH DAILY 06/15/20   Tysinger, Camelia Eng, PA-C  metoprolol succinate (TOPROL-XL) 50 MG 24 hr tablet Take with or immediately following a meal. 01/05/21   Tysinger, Camelia Eng, PA-C  Multiple Vitamin (MULTIVITAMIN) tablet Take 1 tablet by mouth daily.    [provider]  pregabalin (LYRICA) 75 MG capsule Take 75 mg by mouth daily.    [provider]  rosuvastatin (CRESTOR) 40 MG tablet TAKE 1 TABLET(40 MG) BY MOUTH DAILY 01/05/21   Tysinger, Camelia Eng, PA-C  vitamin B-12 (CYANOCOBALAMIN) 100 MCG tablet Take 100 mcg by mouth daily.    [provider]    Family History    Family History  Problem Relation Age of Onset   Cancer Mother        breast   Dementia Mother 60   Kidney disease Father 44       kidney failure   Rheum arthritis Father    Diabetes Neg Hx    Heart disease Neg Hx    Stroke Neg Hx    Hypertension Neg Hx    Hyperlipidemia Neg Hx    Colon cancer Neg Hx    Colon polyps Neg Hx    Esophageal cancer Neg Hx    Rectal cancer Neg Hx    Stomach cancer Neg Hx     He indicated that his mother is deceased. He indicated that his father is deceased. He indicated that his sister is alive. He indicated that his brother is alive. He indicated that the status of his neg hx is unknown.  Social History    Social History   Socioeconomic History   Marital status: Married    Spouse name: Not on file   Number of children: Not on file   Years of education: Not on file   Highest education level: Not on file  Occupational History   Occupation: nursing  student; part time [employer]    Employer: [employer]  Tobacco Use   Smoking status: Never   Smokeless tobacco: Never  Vaping Use   Vaping Use: Never used  Substance and Sexual Activity   Alcohol use: Yes    Alcohol/week: 2.0 standard drinks    Types: 2 Cans of beer per week    Comment: beer once or twice per week   Drug use: No   Sexual activity: Yes  Other Topics Concern   Not on file  Social History Narrative   Married, no children, 2 dogs and a cat, exercise - aerobics, some weight bearing   Social Determinants of Health   Financial Resource Strain: Not on file  Food Insecurity: Not on file  Transportation Needs: Not on file  Physical Activity: Not on file  Stress: Not on file  Social Connections: Not on file  Intimate Partner Violence: Not on file     Review of Systems    General:  No chills, fever, night sweats or weight changes.  Cardiovascular:  No chest pain, dyspnea on exertion, edema, orthopnea, palpitations, paroxysmal nocturnal dyspnea. Dermatological: No rash, lesions/masses Respiratory: No cough, dyspnea Urologic: No hematuria, dysuria Abdominal:   No nausea, vomiting, diarrhea, bright red blood per rectum, melena, or hematemesis Neurologic:  No visual changes, wkns, changes in mental status. All other systems reviewed and are otherwise negative except as noted above.  Physical Exam    VS:  BP 118/70 (BP Location: Left Arm, Patient Position: Sitting, Cuff  Size: Normal)   Pulse (!) 56   Ht 5' 11.5" (1.816 m)   Wt 193 lb 6.4 oz (87.7 kg)   SpO2 99%   BMI 26.60 kg/m  , BMI Body mass index is 26.6 kg/m. GEN: Well nourished, well developed, in no acute distress. HEENT: normal. Neck: Supple, no JVD, carotid bruits, or masses. Cardiac: RRR, no murmurs, rubs, or gallops. No clubbing, cyanosis, edema.  Radials/DP/PT 2+ and equal bilaterally.  Respiratory:  Respirations regular and unlabored, clear to auscultation bilaterally. GI: Soft, nontender, nondistended, BS + x 4. MS: no deformity or atrophy. Skin: warm and dry, no rash. Neuro:  Strength and sensation are intact. Psych: Normal affect.  Accessory Clinical Findings    Recent Labs: 03/09/2021: ALT 49; BUN 22; Creatinine, Ser 1.34; Hemoglobin 16.0; Platelets 141; Potassium 4.3; Sodium 143; TSH 1.630   Recent Lipid Panel    Component Value Date/Time   CHOL 136 06/16/2020 0854   TRIG 97 06/16/2020 0854   HDL 45 06/16/2020 0854   CHOLHDL 3.0 06/16/2020 0854   CHOLHDL 2.3 01/26/2017 0857   VLDL 13 01/26/2017 0857   LDLCALC 73 06/16/2020 0854    ECG personally reviewed by me today-none today.  Echocardiogram 02/04/2021 IMPRESSIONS     1. Left ventricular ejection fraction, by estimation, is 60 to 65%. The  left ventricle has normal function. The left ventricle has no regional  wall motion abnormalities. There is mild concentric left ventricular  hypertrophy. Left ventricular diastolic  parameters are consistent with Grade I diastolic dysfunction (impaired  relaxation). The average left ventricular global longitudinal strain is  -24.3 %. The global longitudinal strain is normal.   2. Right ventricular systolic function is normal. The right ventricular  size is normal.   3. Left atrial size was mildly dilated.   4. The mitral valve is normal in structure. Mild mitral valve  regurgitation. No evidence of mitral stenosis.   5. The aortic valve is normal in structure.  There is  severe calcifcation  of the aortic valve. There is severe thickening of the aortic valve.  Aortic valve regurgitation is mild. Mild aortic valve stenosis. Aortic  valve mean gradient measures 12.0 mmHg.   6. Aortic dilatation noted.   7. The inferior vena cava is normal in size with greater than 50%  respiratory variability, suggesting right atrial pressure of 3 mmHg.   Comparison(s): No significant change from prior study. 01/29/20 EF 60-65%.  Mild AS 39mmHg mean PG, 35mmHg peak PG.  Assessment & Plan   1.  Palpitations-continues to have rare occasional episodes of palpitations.  Previously diagnosed with WPW in 1969. Increase metoprolol to 25 in AM and maintain 50 milligrams PM  Heart healthy low-sodium diet-salty 6 given Increase physical activity as tolerated Avoid triggers caffeine, chocolate, EtOH, dehydration etc.  Essential hypertension-BP today 118/70.  Well-controlled at home. Continue HCTZ, metoprolol Heart healthy low-sodium diet-salty 6 given Increase physical activity as tolerated Maintain blood pressure log  Hyperlipidemia-06/16/2020: Cholesterol, Total 136; HDL 45; LDL Chol Calc (NIH) 73; Triglycerides 97 Continue co-Q10, rosuvastatin, ezetimibe Heart healthy low-sodium high-fiber diet Increase physical activity as tolerated  Aortic stenosis-no increased DOE or activity intolerance.  Echocardiogram 02/04/2021 showed mild aortic stenosis.  Details above. Repeat echocardiogram 3/23  Disposition: Follow-up with Dr. Gwenlyn Found or me in 3 months.  Jossie Ng. Marcellous Snarski NP-C    06/04/2021, 11:21 AM [place] [address] Office [phone] Fax [phone]  Notice: This dictation was prepared with Dragon dictation along with smaller phrase technology. Any transcriptional errors that result from this process are unintentional and may not be corrected upon review.  I spent 15 minutes examining this patient, reviewing medications, and  using patient centered shared decision making involving her cardiac care.  Prior to her visit I spent greater than 20 minutes reviewing her past medical history,  medications, and prior cardiac tests.

## 2021-06-02 DIAGNOSIS — H524 Presbyopia: Secondary | ICD-10-CM | POA: Diagnosis not present

## 2021-06-04 ENCOUNTER — Ambulatory Visit (INDEPENDENT_AMBULATORY_CARE_PROVIDER_SITE_OTHER): Payer: Medicare PPO | Admitting: General Practice

## 2021-06-04 ENCOUNTER — Other Ambulatory Visit: Payer: Self-pay

## 2021-06-04 ENCOUNTER — Encounter: Payer: Self-pay | Admitting: General Practice

## 2021-06-04 VITALS — BP 118/70 | HR 56 | Ht 71.5 in | Wt 193.4 lb

## 2021-06-04 DIAGNOSIS — I10 Essential (primary) hypertension: Secondary | ICD-10-CM | POA: Diagnosis not present

## 2021-06-04 DIAGNOSIS — R002 Palpitations: Secondary | ICD-10-CM

## 2021-06-04 DIAGNOSIS — E785 Hyperlipidemia, unspecified: Secondary | ICD-10-CM | POA: Diagnosis not present

## 2021-06-04 DIAGNOSIS — I350 Nonrheumatic aortic (valve) stenosis: Secondary | ICD-10-CM | POA: Diagnosis not present

## 2021-06-04 MED ORDER — METOPROLOL SUCCINATE ER 50 MG PO TB24: ORAL_TABLET | ORAL | 3 refills | Status: DC

## 2021-06-04 NOTE — Patient Instructions (Signed)
Medication Instructions:  TAKE METOPROLOL 25MG (1/2) IN THE AM AND 50MG  IN THE PM *If you need a refill on your cardiac medications before your next appointment, please call your pharmacy*  Lab Work:   Testing/Procedures:  NONE    NONE  Special Instructions MAY INCREASE SODIUM IN YOUR DIET  PLEASE MAINTAIN PHYSICAL ACTIVITY AS TOLERATED  TAKE AND LOG YOUR BLOOD PRESSURE A COUPLE TIMES A WEEK.  Please try to avoid these triggers: Do not use any products that have nicotine or tobacco in them. These include cigarettes, e-cigarettes, and chewing tobacco. If you need help quitting, ask your doctor. Eat heart-healthy foods. Talk with your doctor about the right eating plan for you. Exercise regularly as told by your doctor. Stay hydrated Do not drink alcohol, Caffeine or chocolate. Lose weight if you are overweight. Do not use drugs, including cannabis   Follow-Up: Your next appointment:  3 month(s) In Person with Quay Burow, MD OR IF UNAVAILABLE [place], FNP-C   At [facility], you and your health needs are our priority.  As part of our continuing mission to provide you with exceptional heart care, we have created designated Provider Care Teams.  These Care Teams include your primary Cardiologist (physician) and Advanced Practice Providers (APPs -  Physician Assistants and Nurse Practitioners) who all work together to provide you with the care you need, when you need it.

## 2021-06-07 DIAGNOSIS — J3089 Other allergic rhinitis: Secondary | ICD-10-CM | POA: Diagnosis not present

## 2021-06-14 DIAGNOSIS — J3089 Other allergic rhinitis: Secondary | ICD-10-CM | POA: Diagnosis not present

## 2021-06-15 ENCOUNTER — Other Ambulatory Visit: Payer: Self-pay

## 2021-06-15 ENCOUNTER — Ambulatory Visit (INDEPENDENT_AMBULATORY_CARE_PROVIDER_SITE_OTHER): Payer: Medicare PPO | Admitting: Dermatology

## 2021-06-15 ENCOUNTER — Encounter: Payer: Self-pay | Admitting: Dermatology

## 2021-06-15 DIAGNOSIS — L729 Follicular cyst of the skin and subcutaneous tissue, unspecified: Secondary | ICD-10-CM

## 2021-06-15 DIAGNOSIS — D692 Other nonthrombocytopenic purpura: Secondary | ICD-10-CM

## 2021-06-15 DIAGNOSIS — Z1283 Encounter for screening for malignant neoplasm of skin: Secondary | ICD-10-CM

## 2021-06-15 DIAGNOSIS — D170 Benign lipomatous neoplasm of skin and subcutaneous tissue of head, face and neck: Secondary | ICD-10-CM | POA: Diagnosis not present

## 2021-06-17 DIAGNOSIS — J3081 Allergic rhinitis due to animal (cat) (dog) hair and dander: Secondary | ICD-10-CM | POA: Diagnosis not present

## 2021-06-18 DIAGNOSIS — J3089 Other allergic rhinitis: Secondary | ICD-10-CM | POA: Diagnosis not present

## 2021-06-23 ENCOUNTER — Encounter: Payer: Self-pay | Admitting: Dermatology

## 2021-06-23 NOTE — Progress Notes (Signed)
   Follow-Up Visit   Subjective  Daniel [name] is a 75 y.o. male who presents for the following: Annual Exam (History of bcc scc atypia, raised spot on post neck cyst?).  General skin examination Location:  Duration:  Quality:  Associated Signs/Symptoms: Modifying Factors:  Severity:  Timing: Context:   Objective  Well appearing patient in no apparent distress; mood and affect are within normal limits. Left Posterior Neck Doughy 2cm subcutaneous nontender nodule  Left Supraclavicular Area, Right Malar Cheek Dermal white for 6 mm papules  Mid Back Full body skin check, skin tear right forearm due to dog.  No atypical pigmented lesions, no nonmelanoma skin cancer.  Left Forearm - Posterior, Right Forearm - Posterior Scattered 1cm ecchymosis.  Denies abnormal bleeding.    A full examination was performed including scalp, head, eyes, ears, nose, lips, neck, chest, axillae, abdomen, back, buttocks, bilateral upper extremities, bilateral lower extremities, hands, feet, fingers, toes, fingernails, and toenails. All findings within normal limits unless otherwise noted below.   Assessment & Plan    Lipoma of neck Left Posterior Neck  May leave if stable  Follicular cyst of skin and subcutaneous tissue (2) Right Malar Cheek; Left Supraclavicular Area  May leave if stable  Screening exam for skin cancer Mid Back  Annual skin examination  Solar purpura ([place]) (2) Left Forearm - Posterior; Right Forearm - Posterior  Dermend over the counter lotion       I, Lavonna Monarch, MD, have reviewed all documentation for this visit.  The documentation on 06/23/21 for the exam, diagnosis, procedures, and orders are all accurate and complete.

## 2021-06-24 ENCOUNTER — Encounter: Payer: Self-pay | Admitting: Medical

## 2021-06-24 ENCOUNTER — Other Ambulatory Visit: Payer: Self-pay

## 2021-06-24 ENCOUNTER — Ambulatory Visit (INDEPENDENT_AMBULATORY_CARE_PROVIDER_SITE_OTHER): Payer: Medicare PPO | Admitting: Medical

## 2021-06-24 VITALS — BP 128/84 | HR 60 | Temp 98.3°F | Ht 71.0 in | Wt 190.8 lb

## 2021-06-24 DIAGNOSIS — I10 Essential (primary) hypertension: Secondary | ICD-10-CM | POA: Diagnosis not present

## 2021-06-24 DIAGNOSIS — M5030 Other cervical disc degeneration, unspecified cervical region: Secondary | ICD-10-CM

## 2021-06-24 DIAGNOSIS — E785 Hyperlipidemia, unspecified: Secondary | ICD-10-CM

## 2021-06-24 DIAGNOSIS — R7301 Impaired fasting glucose: Secondary | ICD-10-CM | POA: Diagnosis not present

## 2021-06-24 DIAGNOSIS — M5416 Radiculopathy, lumbar region: Secondary | ICD-10-CM

## 2021-06-24 DIAGNOSIS — E042 Nontoxic multinodular goiter: Secondary | ICD-10-CM | POA: Diagnosis not present

## 2021-06-24 DIAGNOSIS — Z7189 Other specified counseling: Secondary | ICD-10-CM

## 2021-06-24 DIAGNOSIS — K760 Fatty (change of) liver, not elsewhere classified: Secondary | ICD-10-CM | POA: Diagnosis not present

## 2021-06-24 DIAGNOSIS — L821 Other seborrheic keratosis: Secondary | ICD-10-CM

## 2021-06-24 DIAGNOSIS — Z7185 Encounter for immunization safety counseling: Secondary | ICD-10-CM

## 2021-06-24 DIAGNOSIS — G5790 Unspecified mononeuropathy of unspecified lower limb: Secondary | ICD-10-CM

## 2021-06-24 DIAGNOSIS — I6523 Occlusion and stenosis of bilateral carotid arteries: Secondary | ICD-10-CM

## 2021-06-24 DIAGNOSIS — Z13820 Encounter for screening for osteoporosis: Secondary | ICD-10-CM

## 2021-06-24 DIAGNOSIS — M62838 Other muscle spasm: Secondary | ICD-10-CM

## 2021-06-24 DIAGNOSIS — Z0000 Encounter for general adult medical examination without abnormal findings: Secondary | ICD-10-CM | POA: Diagnosis not present

## 2021-06-24 DIAGNOSIS — M48061 Spinal stenosis, lumbar region without neurogenic claudication: Secondary | ICD-10-CM

## 2021-06-24 DIAGNOSIS — M4180 Other forms of scoliosis, site unspecified: Secondary | ICD-10-CM

## 2021-06-24 DIAGNOSIS — N400 Enlarged prostate without lower urinary tract symptoms: Secondary | ICD-10-CM | POA: Diagnosis not present

## 2021-06-24 DIAGNOSIS — M533 Sacrococcygeal disorders, not elsewhere classified: Secondary | ICD-10-CM

## 2021-06-24 DIAGNOSIS — R29890 Loss of height: Secondary | ICD-10-CM

## 2021-06-24 DIAGNOSIS — Z125 Encounter for screening for malignant neoplasm of prostate: Secondary | ICD-10-CM

## 2021-06-24 DIAGNOSIS — I350 Nonrheumatic aortic (valve) stenosis: Secondary | ICD-10-CM

## 2021-06-24 DIAGNOSIS — N1831 Chronic kidney disease, stage 3a: Secondary | ICD-10-CM

## 2021-06-24 MED ORDER — CYCLOBENZAPRINE HCL 10 MG PO TABS: 10.0000 mg | ORAL_TABLET | Freq: Every day | ORAL | 1 refills | Status: DC

## 2021-06-24 NOTE — Progress Notes (Signed)
Subjective:    Daniel [name] is a 75 y.o. male who presents for Preventative Services visit and chronic medical problems/med check visit.    Primary Care Provider Elani Delph, Camelia Eng, PA-C here for primary care  Current Health Care Team: Dentist, Dr. Georgana Curio doctor, Dr. Nicki Reaper Dr. Susa Day, orthopedics Dr. Quay Burow, cardiology Dr. Lavonna Monarch, dermatology Dr. [name], GI prior, Dr. Scarlette Shorts, GI 2021   Medical Services you may have received from other than [facility] providers in the past year (date may be approximate) Dr. Gwenlyn Found Cardiology  Exercise Current exercise habits: Home exercise routine includes weights at home push ups.   Nutrition/Diet Current diet: well balanced low potassium diet. Limits sweats   Depression Screen Depression screen [facility] 2/9 06/24/2021  Decreased Interest 0  Down, Depressed, Hopeless 0  PHQ - 2 Score 0    Activities of Daily Living Screen/Functional Status Survey Is the patient deaf or have difficulty hearing?: No Does the patient have difficulty seeing, even when wearing glasses/contacts?: No Does the patient have difficulty concentrating, remembering, or making decisions?: No Does the patient have difficulty walking or climbing stairs?: No Does the patient have difficulty dressing or bathing?: No Does the patient have difficulty doing errands alone such as visiting a doctor's office or shopping?: No  Can patient draw a clock face showing 3:15 oclock, yes  Fall Risk Screen Fall Risk  06/24/2021 06/04/2021 03/19/2021 03/09/2021 06/23/2020  Falls in the past year? 0 0 0 0 0  Number falls in past yr: 0 - - 0 -  Injury with Fall? 0 - - 0 -  Risk for fall due to : No Fall Risks - - No Fall Risks -  Follow up Falls evaluation completed - - Falls evaluation completed -    Gait Assessment: Normal gait observed - yes  Advanced directives Does patient have a [place]?  no Does patient have a Living Will?  no  Past Medical History:  Diagnosis Date   Abdominal pain    with coffee   Abdominal ultrasound, abnormal 12/09/09   fatty liver disease   Abnormal carotid ultrasound 03/01/07   R no ICA stenosis. 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 Mosetta Anis;   Arthritis    osteoarthritis back/spine   Basal cell carcinoma 06/15/2020   Left Breast (nodular)   Diverticulosis    Dyslipidemia    Dysplastic nevus 03/01/1993   slight, left back and mid lower back, no treatment   Dysplastic nevus 03/04/2004   moderate-marked, low mid back, excision   Dysplastic nevus 06/14/2010   mild-mod, left hip, wider shave   Fatty liver    H/O CT scan of abdomen 03/20/12   abdomen/pelvis - normal   H/O CT scan of chest 03/20/12   normal   H/O diagnostic ultrasound 03/06/11   Aortic ultrasound - no AAA identified   H/O echocardiogram 10/18/2011   transthoracic - mild concentric LVH, EF 55%, mild valvular aortic stenosis, LA mild to moderate dilation, otherwise normal echo; Dr. Rollene Fare   Heart murmur, aortic    cardiac eval 09/2011, Dr. Gwenlyn Found [facility]), mild aortic stenosis   Hyperlipidemia    Hypertension    Inguinal hernia 2012   right   Normal cardiac stress test 03/02/07   Myoview, EF 65%; Dr. Golden Hurter   SCC (squamous cell carcinoma) 09/17/2019   well diff, left sideburn, Cx3, 5FU   Statin  intolerance    intolerance with Lipitor   Testicular pain 01/08/10   Urology eval, Dr. Alinda Money    Past Surgical History:  Procedure Laterality Date   COLONOSCOPY  10/21/2008   internal hemorrhoids, diverticulosis; repeat 10/2018; Dr. Scarlette Shorts, [place]   HAND SURGERY  2004   left ganglion cyst removed    NOSE SURGERY  2005   obstruction, polyp and deviated septum; Dr. Omar Person SURGERY  2002, 2008   bilat, RTC    Social History   Socioeconomic History   Marital status: Married    Spouse name: Not on file   Number of children: Not on  file   Years of education: Not on file   Highest education level: Not on file  Occupational History   Occupation: Presenter, broadcasting; part time [employer]    Employer: [employer]  Tobacco Use   Smoking status: Never   Smokeless tobacco: Never  Vaping Use   Vaping Use: Never used  Substance and Sexual Activity   Alcohol use: Yes    Alcohol/week: 2.0 standard drinks    Types: 2 Cans of beer per week    Comment: beer once or twice per week   Drug use: No   Sexual activity: Yes  Other Topics Concern   Not on file  Social History Narrative   Married, no children, 2 dogs and a cat, exercise - aerobics, some weight bearing   Social Determinants of Health   Financial Resource Strain: Not on file  Food Insecurity: Not on file  Transportation Needs: Not on file  Physical Activity: Not on file  Stress: Not on file  Social Connections: Not on file  Intimate Partner Violence: Not on file    Family History  Problem Relation Age of Onset   Cancer Mother        breast   Dementia Mother 40   Kidney disease Father 33       kidney failure   Rheum arthritis Father    Diabetes Neg Hx    Heart disease Neg Hx    Stroke Neg Hx    Hypertension Neg Hx    Hyperlipidemia Neg Hx    Colon cancer Neg Hx    Colon polyps Neg Hx    Esophageal cancer Neg Hx    Rectal cancer Neg Hx    Stomach cancer Neg Hx      Current Outpatient Medications:    co-enzyme Q-10 30 MG capsule, Take 30 mg by mouth 3 (three) times daily., Disp: , Rfl:    EPINEPHrine 0.3 mg/0.3 mL IJ SOAJ injection, , Disp: , Rfl:    ezetimibe (ZETIA) 10 MG tablet, TAKE 1 TABLET(10 MG) BY MOUTH DAILY, Disp: 90 tablet, Rfl: 0   metoprolol succinate (TOPROL-XL) 50 MG 24 hr tablet, Take 1 tablet (50 mg total) by mouth every evening AND 0.5 tablets (25 mg total) every morning. Take with or immediately following a meal.., Disp: 45 tablet, Rfl: 3   Multiple Vitamin (MULTIVITAMIN) tablet, Take 1 tablet by mouth daily., Disp: ,  Rfl:    pregabalin (LYRICA) 75 MG capsule, Take 75 mg by mouth daily., Disp: , Rfl:    vitamin B-12 (CYANOCOBALAMIN) 100 MCG tablet, Take 100 mcg by mouth daily., Disp: , Rfl:    cyclobenzaprine (FLEXERIL) 10 MG tablet, Take 1 tablet (10 mg total) by mouth at bedtime. Pt. Takes 1/2 tablet only if needed, Disp: 45 tablet, Rfl: 1  Allergies  Allergen Reactions   Codeine  Nausea Only and Other (See Comments)    Dizziness, sweat and will make patient pass out.   Ampicillin     Rash    Lipitor [Atorvastatin]     Elevated LFTs, no symptoms   Sulfa Antibiotics Rash    History reviewed: allergies, current medications, past family history, past medical history, past social history, past surgical history and problem list  Chronic issues discussed: Checking home BPs.  Seeing regularly 115/70 range .  He has worked hard to lose some weight in recent months, healthy diet, regular exercise  Hyperlipidemia - c/t on Zetia and coenzyme q10   Chronic back pain - he continues on Lyrica  Acute issues discussed: Having some morning hoarseness in recent week     Objective:      Biometrics BP 128/84   Pulse 60   Temp 98.3 F (36.8 C)   Ht $R'5\' 11"'aq$  (1.803 m)   Wt 190 lb 12.8 oz (86.5 kg)   BMI 26.61 kg/m   Cognitive Testing  Alert? Yes  Normal Appearance?Yes  Oriented to person? Yes  Place? Yes   Time? Yes  Recall of three objects?  Yes  Can perform simple calculations? Yes  Displays appropriate judgment?Yes  Can read the correct time from a watch face?Yes  General appearance: alert, no distress, WD/WN, white male  Nutritional Status: Inadequate calore intake? no Loss of muscle mass? no Loss of fat beneath skin? no Localized or general edema? no Diminished functional status? no  Other pertinent exam: Skin: scattered macules, no worrisome lesions HEENT: normocephalic, sclerae anicteric, TMs pearly, nares patent, no discharge or erythema, pharynx normal Oral cavity: MMM, no  lesions, mild post nasal drainage Neck: supple, no lymphadenopathy, no thyromegaly, no masses Heart: RRR, normal S1, S2, no murmurs Lungs: CTA bilaterally, no wheezes, rhonchi, or rales Abdomen: +bs, soft, non tender, non distended, no masses, no hepatomegaly, no splenomegaly Musculoskeletal: nontender, no swelling, no obvious deformity Extremities: no edema, no cyanosis, no clubbing Pulses: 2+ symmetric, upper and lower extremities, normal cap refill Neurological: alert, oriented x 3, CN2-12 intact, strength normal upper extremities and lower extremities, sensation normal throughout, DTRs 2+ throughout, no cerebellar signs, gait normal Psychiatric: normal affect, behavior normal, pleasant  GU: normal male, circ, no lesions, no hernia Rectal: tender over coccyx, anus normal tone, prostate mildly enlarged, no nodules    Assessment:   Encounter Diagnoses  Name Primary?   Encounter for health maintenance examination in adult Yes   Advanced directives, counseling/discussion    Medicare annual wellness visit, subsequent    Multinodular thyroid    Lumbar radiculopathy    Impaired fasting blood sugar    Fatty liver    Essential hypertension, benign    Dyslipidemia    DDD (degenerative disc disease), cervical    Benign prostatic hyperplasia, unspecified whether lower urinary tract symptoms present    Atherosclerosis of both carotid arteries    Nonrheumatic aortic valve stenosis    Vaccine counseling    Stage 3a chronic kidney disease ([place])    Spinal stenosis of lumbar region, unspecified whether neurogenic claudication present    Seborrheic keratosis    Other form of scoliosis, unspecified spinal region    Neuropathy of lower extremity, unspecified laterality    Coccydynia    Muscle spasm    Loss of height    Screening for osteoporosis      Plan:   A preventative services visit was completed today.  During the course of the visit today, we discussed  and counseled about  appropriate screening and preventive services.  A health risk assessment was established today that included a review of current medications, allergies, social history, family history, medical and preventative health history, biometrics, and preventative screenings to identify potential safety concerns or impairments.   This visit was a preventative care visit, also known as wellness visit or routine physical.   Topics typically include healthy lifestyle, diet, exercise, preventative care, vaccinations, sick and well care, proper use of emergency dept and after hours care, as well as other concerns.     Recommendations: Continue to return yearly for your annual wellness and preventative care visits.  This gives [place] a chance to discuss healthy lifestyle, exercise, vaccinations, review your chart record, and perform screenings where appropriate.  I recommend you see your eye doctor yearly for routine vision care.  I recommend you see your dentist yearly for routine dental care including hygiene visits twice yearly.   Vaccination recommendations were reviewed Immunization History  Administered Date(s) Administered   Hepatitis B 05/17/1992, 12/17/1992, 06/16/2002   HiB (PRP-OMP) 09/17/2018   Influenza Split 09/08/2014, 08/29/2015   Influenza, High Dose Seasonal PF 09/01/2015, 09/13/2016, 09/13/2017, 09/17/2018   Influenza,inj,quad, With Preservative 08/13/2019   Influenza-Unspecified 09/02/2017, 09/17/2018   MMR 04/16/2012   PFIZER(Purple Top)SARS-COV-2 Vaccination 12/24/2019, 01/14/2020, 08/23/2020, 09/04/2020   PPD Test 04/02/2012, 04/16/2012, 06/04/2013, 06/19/2013   Pneumococcal Conjugate-13 11/19/2015, 08/19/2017   Pneumococcal Polysaccharide-23 03/14/2012, 09/13/2016, 08/25/2017, 08/15/2018, 09/24/2018, 09/23/2019, 09/04/2020   Pneumococcal-Unspecified 12/05/2001, 09/14/2018, 07/01/2019   Td 02/03/2003, 02/08/2021   Tdap 12/02/2009   Zoster Recombinat (Shingrix) 09/09/2017, 10/05/2017    Zoster, Live 09/16/2008    I recommend a yearly flu shot in the fall    Screening for cancer: Colon cancer screening: I reviewed your colonoscopy on file that is up to date from 11/12/2020 showing diverticula sigmoid colon, otherwise normal, Dr. Scarlette Shorts  We discussed PSA, prostate exam, and prostate cancer screening risks/benefits.     Skin cancer screening: Check your skin regularly for new changes, growing lesions, or other lesions of concern Come in for evaluation if you have skin lesions of concern.  Lung cancer screening: If you have a greater than 20 pack year history of tobacco use, then you may qualify for lung cancer screening with a chest CT scan.   Please call your insurance company to inquire about coverage for this test.  We currently don't have screenings for other cancers besides breast, cervical, colon, and lung cancers.  If you have a strong family history of cancer or have other cancer screening concerns, please let me know.    Bone health: Get at least 150 minutes of aerobic exercise weekly Get weight bearing exercise at least once weekly Bone density test:  A bone density test is an imaging test that uses a type of X-ray to measure the amount of calcium and other minerals in your bones. The test may be used to diagnose or screen you for a condition that causes weak or thin bones (osteoporosis), predict your risk for a broken bone (fracture), or determine how well your osteoporosis treatment is working. The bone density test is recommended for females 52 and older, or females or males <37 if certain risk factors such as thyroid disease, long term use of steroids such as for asthma or rheumatological issues, vitamin D deficiency, estrogen deficiency, family history of osteoporosis, self or family history of fragility fracture in first degree relative.   Consider bone density test.  Call insurance to see  if they will cover this    Heart health: Get at least  150 minutes of aerobic exercise weekly Limit alcohol It is important to maintain a healthy blood pressure and healthy cholesterol numbers  Heart disease screening: Screening for heart disease includes screening for blood pressure, fasting lipids, glucose/diabetes screening, BMI height to weight ratio, reviewed of smoking status, physical activity, and diet.    Goals include blood pressure 120/80 or less, maintaining a healthy lipid/cholesterol profile, preventing diabetes or keeping diabetes numbers under good control, not smoking or using tobacco products, exercising most days per week or at least 150 minutes per week of exercise, and eating healthy variety of fruits and vegetables, healthy oils, and avoiding unhealthy food choices like fried food, fast food, high sugar and high cholesterol foods.    Other tests may possibly include EKG test, CT coronary calcium score, echocardiogram, exercise treadmill stress test.     Medical care options: I recommend you continue to seek care here first for routine care.  We try really hard to have available appointments Monday through Friday daytime hours for sick visits, acute visits, and physicals.  Urgent care should be used for after hours and weekends for significant issues that cannot wait till the next day.  The emergency department should be used for significant potentially life-threatening emergencies.  The emergency department is expensive, can often have long wait times for less significant concerns, so try to utilize primary care, urgent care, or telemedicine when possible to avoid unnecessary trips to the emergency department.  Virtual visits and telemedicine have been introduced since the pandemic started in 2020, and can be convenient ways to receive medical care.  We offer virtual appointments as well to assist you in a variety of options to seek medical care.    Separate significant issues discussed:  Advanced directives - discussed nature  and purpose of Advanced Directives, encouraged them to complete them if they have not done so and/or encouraged them to get [place] a copy if they have done this already.  Fatty liver disease-you have prior fatty liver disease on ultrasound.  However given your recent weight loss and healthier lifestyle, this is probably improved.  If you would like to repeat ultrasound we can do this  Thyroid nodule-I have placed an order to do updated ultrasound soon.  Expect a call back on this.  This is for surveillance  Muscle spasm-continue half tablet of Flexeril cyclobenzaprine as needed.  Caution with sedation.  Continue regular exercise and stretching  Coccydynia/butt bone pain-we will request records from orthopedics to see if we need to do any other type of evaluation.  Consider follow-up with them since the steroid shot last year did help  Hoarseness-begin over-the-counter allergy pill at night for the next week to see if that helps.  I suspect this is due to postnasal drainage  Dyslipidemia/high cholesterol-continue Zetia, labs today  Hypertension-managed by cardiology, continue current therapy  Atherosclerosis of carotid arteries-continue Zetia, eat a low-cholesterol diet  Loss of height, degenerative disc disease-consider bone density screening for osteoporosis    Reyli was seen today for annual exam.  Diagnoses and all orders for this visit:  Encounter for health maintenance examination in adult -     US THYROID; Future -     Lipid panel -     PSA, total and free  Advanced directives, counseling/discussion  Medicare annual wellness visit, subsequent  Multinodular thyroid -     US THYROID; Future  Lumbar radiculopathy  Impaired  fasting blood sugar  Fatty liver  Essential hypertension, benign  Dyslipidemia -     Lipid panel  DDD (degenerative disc disease), cervical -     DG Bone Density; Future  Benign prostatic hyperplasia, unspecified whether lower urinary tract  symptoms present -     PSA, total and free  Atherosclerosis of both carotid arteries  Nonrheumatic aortic valve stenosis  Vaccine counseling  Stage 3a chronic kidney disease ([place]) -     DG Bone Density; Future  Spinal stenosis of lumbar region, unspecified whether neurogenic claudication present  Seborrheic keratosis  Other form of scoliosis, unspecified spinal region  Neuropathy of lower extremity, unspecified laterality  Coccydynia -     DG Bone Density; Future  Muscle spasm  Loss of height -     DG Bone Density; Future  Screening for osteoporosis -     DG Bone Density; Future  Other orders -     cyclobenzaprine (FLEXERIL) 10 MG tablet; Take 1 tablet (10 mg total) by mouth at bedtime. Pt. Takes 1/2 tablet only if needed      Medicare Attestation A preventative services visit was completed today.  During the course of the visit the patient was educated and counseled about appropriate screening and preventive services.  A health risk assessment was established with the patient that included a review of current medications, allergies, social history, family history, medical and preventative health history, biometrics, and preventative screenings to identify potential safety concerns or impairments.  A personalized plan was printed today for the patient's records and use.   Personalized health advice and education was given today to reduce health risks and promote self management and wellness.  Information regarding end of life planning was discussed today.  Dorothea Ogle, PA-C   06/24/2021

## 2021-06-24 NOTE — Patient Instructions (Addendum)
DENTIST RECOMMENDATION Dr. Jonna Coup, dentist [address] [place], [place] [address] [phone] [email or web address]   This visit was a preventative care visit, also known as wellness visit or routine physical.   Topics typically include healthy lifestyle, diet, exercise, preventative care, vaccinations, sick and well care, proper use of emergency dept and after hours care, as well as other concerns.     Recommendations: Continue to return yearly for your annual wellness and preventative care visits.  This gives [place] a chance to discuss healthy lifestyle, exercise, vaccinations, review your chart record, and perform screenings where appropriate.  I recommend you see your eye doctor yearly for routine vision care.  I recommend you see your dentist yearly for routine dental care including hygiene visits twice yearly.   Vaccination recommendations were reviewed Immunization History  Administered Date(s) Administered   Hepatitis B 05/17/1992, 12/17/1992, 06/16/2002   HiB (PRP-OMP) 09/17/2018   Influenza Split 09/08/2014, 08/29/2015   Influenza, High Dose Seasonal PF 09/01/2015, 09/13/2016, 09/13/2017, 09/17/2018   Influenza,inj,quad, With Preservative 08/13/2019   Influenza-Unspecified 09/02/2017, 09/17/2018   MMR 04/16/2012   PFIZER(Purple Top)SARS-COV-2 Vaccination 12/24/2019, 01/14/2020, 08/23/2020, 09/04/2020   PPD Test 04/02/2012, 04/16/2012, 06/04/2013, 06/19/2013   Pneumococcal Conjugate-13 11/19/2015, 08/19/2017   Pneumococcal Polysaccharide-23 03/14/2012, 09/13/2016, 08/25/2017, 08/15/2018, 09/24/2018, 09/23/2019, 09/04/2020   Pneumococcal-Unspecified 12/05/2001, 09/14/2018, 07/01/2019   Td 02/03/2003, 02/08/2021   Tdap 12/02/2009   Zoster Recombinat (Shingrix) 09/09/2017, 10/05/2017   Zoster, Live 09/16/2008    I recommend a yearly flu shot in the fall    Screening for cancer: Colon cancer screening: I reviewed your colonoscopy on file that is up to date from  11/12/2020 showing diverticula sigmoid colon, otherwise normal, Dr. Scarlette Shorts  We discussed PSA, prostate exam, and prostate cancer screening risks/benefits.     Skin cancer screening: Check your skin regularly for new changes, growing lesions, or other lesions of concern Come in for evaluation if you have skin lesions of concern.  Lung cancer screening: If you have a greater than 20 pack year history of tobacco use, then you may qualify for lung cancer screening with a chest CT scan.   Please call your insurance company to inquire about coverage for this test.  We currently don't have screenings for other cancers besides breast, cervical, colon, and lung cancers.  If you have a strong family history of cancer or have other cancer screening concerns, please let me know.    Bone health: Get at least 150 minutes of aerobic exercise weekly Get weight bearing exercise at least once weekly Bone density test:  A bone density test is an imaging test that uses a type of X-ray to measure the amount of calcium and other minerals in your bones. The test may be used to diagnose or screen you for a condition that causes weak or thin bones (osteoporosis), predict your risk for a broken bone (fracture), or determine how well your osteoporosis treatment is working. The bone density test is recommended for females 38 and older, or females or males <63 if certain risk factors such as thyroid disease, long term use of steroids such as for asthma or rheumatological issues, vitamin D deficiency, estrogen deficiency, family history of osteoporosis, self or family history of fragility fracture in first degree relative.   Consider bone density test.  Call insurance to see if they will cover this    Heart health: Get at least 150 minutes of aerobic exercise weekly Limit alcohol It is important to maintain a healthy blood pressure  and healthy cholesterol numbers  Heart disease screening: Screening for heart  disease includes screening for blood pressure, fasting lipids, glucose/diabetes screening, BMI height to weight ratio, reviewed of smoking status, physical activity, and diet.    Goals include blood pressure 120/80 or less, maintaining a healthy lipid/cholesterol profile, preventing diabetes or keeping diabetes numbers under good control, not smoking or using tobacco products, exercising most days per week or at least 150 minutes per week of exercise, and eating healthy variety of fruits and vegetables, healthy oils, and avoiding unhealthy food choices like fried food, fast food, high sugar and high cholesterol foods.    Other tests may possibly include EKG test, CT coronary calcium score, echocardiogram, exercise treadmill stress test.     Medical care options: I recommend you continue to seek care here first for routine care.  We try really hard to have available appointments Monday through Friday daytime hours for sick visits, acute visits, and physicals.  Urgent care should be used for after hours and weekends for significant issues that cannot wait till the next day.  The emergency department should be used for significant potentially life-threatening emergencies.  The emergency department is expensive, can often have long wait times for less significant concerns, so try to utilize primary care, urgent care, or telemedicine when possible to avoid unnecessary trips to the emergency department.  Virtual visits and telemedicine have been introduced since the pandemic started in 2020, and can be convenient ways to receive medical care.  We offer virtual appointments as well to assist you in a variety of options to seek medical care.    Separate significant issues discussed:  Advanced directives - discussed nature and purpose of Advanced Directives, encouraged them to complete them if they have not done so and/or encouraged them to get [place] a copy if they have done this already.    Advanced  directives - discussed nature and purpose of Advanced Directives, encouraged them to complete them if they have not done so and/or encouraged them to get [place] a copy if they have done this already.  Fatty liver disease-you have prior fatty liver disease on ultrasound.  However given your recent weight loss and healthier lifestyle, this is probably improved.  If you would like to repeat ultrasound we can do this  Thyroid nodule-I have placed an order to do updated ultrasound soon.  Expect a call back on this.  This is for surveillance  Muscle spasm-continue half tablet of Flexeril cyclobenzaprine as needed.  Caution with sedation.  Continue regular exercise and stretching  Coccydynia/butt bone pain-we will request records from orthopedics to see if we need to do any other type of evaluation.  Consider follow-up with them since the steroid shot last year did help  Hoarseness-begin over-the-counter allergy pill at night for the next week to see if that helps.  I suspect this is due to postnasal drainage  Dyslipidemia/high cholesterol-continue Zetia, labs today  Hypertension-managed by cardiology, continue current therapy  Atherosclerosis of carotid arteries-continue Zetia, eat a low-cholesterol diet  Loss of height, degenerative disc disease-consider bone density screening for osteoporosis

## 2021-06-25 LAB — LIPID PANEL
Chol/HDL Ratio: 3.7 ratio (ref 0.0–5.0)
Cholesterol, Total: 186 mg/dL (ref 100–199)
HDL: 50 mg/dL (ref >39)
LDL Chol Calc (NIH): 118 mg/dL — ABNORMAL HIGH (ref 0–99)
Triglycerides: 98 mg/dL (ref 0–149)
VLDL Cholesterol Cal: 18 mg/dL (ref 5–40)

## 2021-06-25 LAB — PSA, TOTAL AND FREE
PSA, Free Pct: 33 %
PSA, Free: 1.32 ng/mL
Prostate Specific Ag, Serum: 4 ng/mL (ref 0.0–4.0)

## 2021-06-29 ENCOUNTER — Other Ambulatory Visit: Payer: Self-pay | Admitting: Medical

## 2021-06-29 MED ORDER — ROSUVASTATIN CALCIUM 10 MG PO TABS: 10.0000 mg | ORAL_TABLET | ORAL | 0 refills | Status: DC

## 2021-06-30 ENCOUNTER — Other Ambulatory Visit: Payer: Self-pay | Admitting: Medical

## 2021-06-30 DIAGNOSIS — M533 Sacrococcygeal disorders, not elsewhere classified: Secondary | ICD-10-CM

## 2021-06-30 DIAGNOSIS — Z13820 Encounter for screening for osteoporosis: Secondary | ICD-10-CM

## 2021-06-30 DIAGNOSIS — R29890 Loss of height: Secondary | ICD-10-CM

## 2021-06-30 DIAGNOSIS — N1831 Chronic kidney disease, stage 3a: Secondary | ICD-10-CM

## 2021-06-30 DIAGNOSIS — M5030 Other cervical disc degeneration, unspecified cervical region: Secondary | ICD-10-CM

## 2021-07-02 DIAGNOSIS — J3081 Allergic rhinitis due to animal (cat) (dog) hair and dander: Secondary | ICD-10-CM | POA: Diagnosis not present

## 2021-07-02 DIAGNOSIS — J301 Allergic rhinitis due to pollen: Secondary | ICD-10-CM | POA: Diagnosis not present

## 2021-07-09 ENCOUNTER — Ambulatory Visit
Admission: RE | Admit: 2021-07-09 | Discharge: 2021-07-09 | Disposition: A | Discharge status: Home / Self Care | Payer: Medicare PPO | Source: Ambulatory Visit | Attending: Medical | Admitting: Medical

## 2021-07-09 ENCOUNTER — Telehealth: Payer: Self-pay | Admitting: Medical

## 2021-07-09 DIAGNOSIS — J3089 Other allergic rhinitis: Secondary | ICD-10-CM | POA: Diagnosis not present

## 2021-07-09 DIAGNOSIS — Z0000 Encounter for general adult medical examination without abnormal findings: Secondary | ICD-10-CM

## 2021-07-09 DIAGNOSIS — E042 Nontoxic multinodular goiter: Secondary | ICD-10-CM

## 2021-07-09 DIAGNOSIS — E041 Nontoxic single thyroid nodule: Secondary | ICD-10-CM | POA: Diagnosis not present

## 2021-07-09 NOTE — Telephone Encounter (Signed)
Received requested records from [facility]

## 2021-07-12 ENCOUNTER — Telehealth: Payer: Self-pay | Admitting: Medical

## 2021-07-12 ENCOUNTER — Other Ambulatory Visit: Payer: Self-pay | Admitting: Medical

## 2021-07-12 DIAGNOSIS — E041 Nontoxic single thyroid nodule: Secondary | ICD-10-CM

## 2021-07-12 NOTE — Telephone Encounter (Signed)
Pt called and said [facility] wants to do a biopsy on his thyroid but they need an order or some type of documentation sent over so they can do so.

## 2021-07-12 NOTE — Telephone Encounter (Signed)
Pt was notified.  

## 2021-07-14 DIAGNOSIS — J3089 Other allergic rhinitis: Secondary | ICD-10-CM | POA: Diagnosis not present

## 2021-07-28 ENCOUNTER — Ambulatory Visit
Admission: RE | Admit: 2021-07-28 | Discharge: 2021-07-28 | Disposition: A | Discharge status: Home / Self Care | Payer: Medicare PPO | Source: Ambulatory Visit | Attending: Medical | Admitting: Medical

## 2021-07-28 ENCOUNTER — Other Ambulatory Visit: Payer: Self-pay

## 2021-07-28 ENCOUNTER — Other Ambulatory Visit (HOSPITAL_COMMUNITY)
Admission: RE | Admit: 2021-07-28 | Discharge: 2021-07-28 | Disposition: A | Discharge status: Home / Self Care | Payer: Medicare PPO | Source: Ambulatory Visit | Attending: Radiology | Admitting: Radiology

## 2021-07-28 DIAGNOSIS — E041 Nontoxic single thyroid nodule: Secondary | ICD-10-CM | POA: Diagnosis not present

## 2021-07-29 LAB — CYTOLOGY - NON PAP

## 2021-07-30 DIAGNOSIS — J3089 Other allergic rhinitis: Secondary | ICD-10-CM | POA: Diagnosis not present

## 2021-08-04 DIAGNOSIS — J3081 Allergic rhinitis due to animal (cat) (dog) hair and dander: Secondary | ICD-10-CM | POA: Diagnosis not present

## 2021-08-09 ENCOUNTER — Other Ambulatory Visit: Payer: Self-pay | Admitting: Medical

## 2021-08-09 DIAGNOSIS — N400 Enlarged prostate without lower urinary tract symptoms: Secondary | ICD-10-CM | POA: Diagnosis not present

## 2021-08-09 DIAGNOSIS — R972 Elevated prostate specific antigen [PSA]: Secondary | ICD-10-CM | POA: Diagnosis not present

## 2021-08-09 DIAGNOSIS — E785 Hyperlipidemia, unspecified: Secondary | ICD-10-CM

## 2021-08-12 DIAGNOSIS — J3089 Other allergic rhinitis: Secondary | ICD-10-CM | POA: Diagnosis not present

## 2021-08-25 DIAGNOSIS — J3089 Other allergic rhinitis: Secondary | ICD-10-CM | POA: Diagnosis not present

## 2021-09-03 DIAGNOSIS — R059 Cough, unspecified: Secondary | ICD-10-CM | POA: Diagnosis not present

## 2021-09-03 DIAGNOSIS — J3089 Other allergic rhinitis: Secondary | ICD-10-CM | POA: Diagnosis not present

## 2021-09-03 DIAGNOSIS — H1045 Other chronic allergic conjunctivitis: Secondary | ICD-10-CM | POA: Diagnosis not present

## 2021-09-09 DIAGNOSIS — J3089 Other allergic rhinitis: Secondary | ICD-10-CM | POA: Diagnosis not present

## 2021-09-14 ENCOUNTER — Other Ambulatory Visit: Payer: Self-pay

## 2021-09-14 ENCOUNTER — Ambulatory Visit (INDEPENDENT_AMBULATORY_CARE_PROVIDER_SITE_OTHER): Payer: Medicare PPO | Admitting: Cardiovascular Disease

## 2021-09-14 ENCOUNTER — Encounter: Payer: Self-pay | Admitting: Cardiovascular Disease

## 2021-09-14 DIAGNOSIS — I10 Essential (primary) hypertension: Secondary | ICD-10-CM

## 2021-09-14 DIAGNOSIS — E785 Hyperlipidemia, unspecified: Secondary | ICD-10-CM | POA: Diagnosis not present

## 2021-09-14 DIAGNOSIS — I350 Nonrheumatic aortic (valve) stenosis: Secondary | ICD-10-CM

## 2021-09-14 NOTE — Patient Instructions (Signed)
Medication Instructions:  Your physician recommends that you continue on your current medications as directed. Please refer to the Current Medication list given to you today.  *If you need a refill on your cardiac medications before your next appointment, please call your pharmacy*   Testing/Procedures: Your physician has requested that you have an echocardiogram. Echocardiography is a painless test that uses sound waves to create images of your heart. It provides your doctor with information about the size and shape of your heart and how well your heart's chambers and valves are working. This procedure takes approximately one hour. There are no restrictions for this procedure. To be done March 2023. This procedure is done at [address].     Follow-Up: At [facility], you and your health needs are our priority.  As part of our continuing mission to provide you with exceptional heart care, we have created designated Provider Care Teams.  These Care Teams include your primary Cardiologist (physician) and Advanced Practice Providers (APPs -  Physician Assistants and Nurse Practitioners) who all work together to provide you with the care you need, when you need it.  We recommend signing up for the patient portal called "MyChart".  Sign up information is provided on this After Visit Summary.  MyChart is used to connect with patients for Virtual Visits (Telemedicine).  Patients are able to view lab/test results, encounter notes, upcoming appointments, etc.  Non-urgent messages can be sent to your provider as well.   To learn more about what you can do with MyChart, go to [email or web address].    Your next appointment:   12 month(s)  The format for your next appointment:   In Person  Provider:   Quay Burow, MD

## 2021-09-14 NOTE — Progress Notes (Signed)
09/14/2021 DONNALD TABAR   1946/09/21  [id number]  Primary Physician Tysinger, Camelia Eng, PA-C Primary Cardiologist: [name] MD Renae Gloss  HPI:  Daniel [name] is a 75 y.o. 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  He was referred by Dr. Earlean Shawl for cardiovascular evaluation because of an auscultated murmur. He saw Dr. Golden Hurter remotely.  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 He has rare episodes of tachy palpitations. His cardiovascular risk factor profile is positive for hypertension and hyperlipidemia but otherwise is benign. There is no family history of heart disease. He has never had a heart attack or stroke and denies chest pain or shortness of breath. He had an echo done on 2008 that showed normal LV systolic function with an aortic valve area of 1.79 cm consistent with mild aortic stenosis.      He has lost 25 pounds as a result of diet and exercise.  He has since come off his antihypertensive medications and is normotensive at home when he checks.  He does do exercise on a daily basis.   His most recent 2D echocardiogram performed 02/04/2021 revealed normal LV size and function, grade 1 diastolic dysfunction and mild aortic stenosis with a valve area of 1.66 cm.  A peak gradient of 22 mmHg.  Because of his weight loss he came off his antihypertensive medications and noted palpitations at night.  He is placed back on his beta-blocker and these have resolved.  He continues to be very active.  He does 250 push-ups a night, does aerobics and weights as well and is completely asymptomatic.   Current Meds  Medication Sig   co-enzyme Q-10 30 MG capsule Take 30 mg by mouth 3 (three) times daily.   cyclobenzaprine (FLEXERIL) 10 MG tablet Take 1 tablet (10 mg total) by mouth at bedtime. Pt. Takes 1/2  tablet only if needed   EPINEPHrine 0.3 mg/0.3 mL IJ SOAJ injection    ezetimibe (ZETIA) 10 MG tablet TAKE 1 TABLET(10 MG) BY MOUTH DAILY   metoprolol succinate (TOPROL-XL) 50 MG 24 hr tablet Take 1 tablet (50 mg total) by mouth every evening AND 0.5 tablets (25 mg total) every morning. Take with or immediately following a meal..   Multiple Vitamin (MULTIVITAMIN) tablet Take 1 tablet by mouth daily.   pregabalin (LYRICA) 75 MG capsule Take 75 mg by mouth daily.   rosuvastatin (CRESTOR) 10 MG tablet Take 1 tablet (10 mg total) by mouth every other day.   vitamin B-12 (CYANOCOBALAMIN) 100 MCG tablet Take 100 mcg by mouth daily.     Allergies  Allergen Reactions   Codeine Nausea Only and Other (See Comments)    Dizziness, sweat and will make patient pass out.   Ampicillin     Rash    Lipitor [Atorvastatin]     Elevated LFTs, no symptoms   Sulfa Antibiotics Rash    Social History   Socioeconomic History   Marital status: Married    Spouse name: Not on file   Number of children: Not on file   Years of education: Not on file   Highest education level: Not on file  Occupational History   Occupation: Presenter, broadcasting; part time [employer]    Employer: [employer]  Tobacco Use   Smoking status: Never  Smokeless tobacco: Never  Vaping Use   Vaping Use: Never used  Substance and Sexual Activity   Alcohol use: Yes    Alcohol/week: 2.0 standard drinks    Types: 2 Cans of beer per week    Comment: beer once or twice per week   Drug use: No   Sexual activity: Yes  Other Topics Concern   Not on file  Social History Narrative   Married, no children, 2 dogs and a cat, exercise - aerobics, some weight bearing   Social Determinants of Health   Financial Resource Strain: Not on file  Food Insecurity: Not on file  Transportation Needs: Not on file  Physical Activity: Not on file  Stress: Not on file  Social Connections: Not on file  Intimate Partner Violence: Not on file      Review of Systems: General: negative for chills, fever, night sweats or weight changes.  Cardiovascular: negative for chest pain, dyspnea on exertion, edema, orthopnea, palpitations, paroxysmal nocturnal dyspnea or shortness of breath Dermatological: negative for rash Respiratory: negative for cough or wheezing Urologic: negative for hematuria Abdominal: negative for nausea, vomiting, diarrhea, bright red blood per rectum, melena, or hematemesis Neurologic: negative for visual changes, syncope, or dizziness All other systems reviewed and are otherwise negative except as noted above.    Blood pressure 120/84, pulse (!) 55, height 5' 11.5" (1.816 m), weight 194 lb 3.2 oz (88.1 kg).  General appearance: alert and no distress Neck: no adenopathy, no JVD, supple, symmetrical, trachea midline, thyroid not enlarged, symmetric, no tenderness/mass/nodules, and soft bilateral carotid bruits versus transmitted murmur Lungs: clear to auscultation bilaterally Heart: 2/6 systolic ejection murmur at the base consistent with aortic stenosis Extremities: extremities normal, atraumatic, no cyanosis or edema Pulses: 2+ and symmetric Skin: Skin color, texture, turgor normal. No rashes or lesions Neurologic: Grossly normal  EKG sinus bradycardia 55 with moderate LVH voltage .  I personally reviewed this EKG.  ASSESSMENT AND PLAN:   Essential hypertension, benign History of essential hypertension a blood pressure measured today at 120/84.  He is on Toprol.  Dyslipidemia History of dyslipidemia on Crestor and Zetia with lipid profile performed 06/24/2021 revealing total cholesterol 186, LDL of 118 and HDL 50.  Aortic stenosis History of mild aortic stenosis with 2D echo performed 02/04/2021 revealing normal LV size and function with mild concentric LVH, grade 1 diastolic dysfunction and a valve area of 1.66 cm with a peak gradient of 22 mmHg.  He is completely asymptomatic and very active.  We will  repeat a 2D echo this coming March.     [name] MD FACP,FACC,FAHA, [facility] 09/14/2021 11:30 AM

## 2021-09-14 NOTE — Assessment & Plan Note (Signed)
History of dyslipidemia on Crestor and Zetia with lipid profile performed 06/24/2021 revealing total cholesterol 186, LDL of 118 and HDL 50.

## 2021-09-14 NOTE — Assessment & Plan Note (Signed)
History of mild aortic stenosis with 2D echo performed 02/04/2021 revealing normal LV size and function with mild concentric LVH, grade 1 diastolic dysfunction and a valve area of 1.66 cm with a peak gradient of 22 mmHg.  He is completely asymptomatic and very active.  We will repeat a 2D echo this coming March.

## 2021-09-14 NOTE — Assessment & Plan Note (Signed)
History of essential hypertension a blood pressure measured today at 120/84.  He is on Toprol.

## 2021-09-23 DIAGNOSIS — J3089 Other allergic rhinitis: Secondary | ICD-10-CM | POA: Diagnosis not present

## 2021-10-08 ENCOUNTER — Telehealth (INDEPENDENT_AMBULATORY_CARE_PROVIDER_SITE_OTHER): Payer: Medicare PPO | Admitting: Medical

## 2021-10-08 ENCOUNTER — Other Ambulatory Visit: Payer: Self-pay

## 2021-10-08 VITALS — BP 125/69 | HR 62 | Temp 97.3°F | Wt 190.0 lb

## 2021-10-08 DIAGNOSIS — R448 Other symptoms and signs involving general sensations and perceptions: Secondary | ICD-10-CM | POA: Diagnosis not present

## 2021-10-08 DIAGNOSIS — R0981 Nasal congestion: Secondary | ICD-10-CM

## 2021-10-08 DIAGNOSIS — J3089 Other allergic rhinitis: Secondary | ICD-10-CM | POA: Diagnosis not present

## 2021-10-08 MED ORDER — CLARITHROMYCIN 500 MG PO TABS: 500.0000 mg | ORAL_TABLET | Freq: Two times a day (BID) | ORAL | 0 refills | Status: DC

## 2021-10-08 NOTE — Progress Notes (Signed)
Subjective:     Patient ID: Daniel [name], male   DOB: [birth year], 75 y.o.   MRN: [id number]  This visit type was conducted due to national recommendations for restrictions regarding the COVID-19 Pandemic (e.g. social distancing) in an effort to limit this patient's exposure and mitigate transmission in our community.  Due to their co-morbid illnesses, this patient is at least at moderate risk for complications without adequate follow up.  This format is felt to be most appropriate for this patient at this time.    Documentation for virtual audio and video telecommunications through [place] encounter:  The patient was located at home. The provider was located in the office. The patient did consent to this visit and is aware of possible charges through their insurance for this visit.  The other persons participating in this telemedicine service were none. Time spent on call was 20 minutes and in review of previous records 20 minutes total.  This virtual service is not related to other E/M service within previous 7 days.   HPI Chief Complaint  Patient presents with   sins infection    Benting over and having sinus pain and teeth pain on left side-  2 weeks. No other symptoms   Virtual consult for sinus pain.  For the past 1-2 weeks having pain in face on left.  Left upper teeth have been hurting some, particularly bending over.  When doing pushups hurts on the way down but when he stands back up the pain subsides.   Hurts to rub teeth with tongue sometimes.  Eating, chewing, even hard texture foods doesn't hurt to bite on left side.     Denies sore throat, drainage, but has had some stopped up nose, no fever, no cough, no sneezing.  However, does get nasal turbinate swelling intermittent more chronically, like one side will be stopped up.   Worse at bedtime.  Uses afrin occasionally.  Just started Flonase OTC 2 nights ago.    Last dentist appt 6 months ago without issue.  No cavity in  20 years.  No other aggravating or relieving factors. No other complaint.  Review of Systems As in subjective    Objective:   Physical Exam Due to coronavirus pandemic stay at home measures, patient visit was virtual and they were not examined in person.   BP 125/69   Pulse 62   Temp (!) 97.3 F (36.3 C)   Wt 190 lb (86.2 kg)   BMI 26.13 kg/m   General: Well-developed, well-nourished, no acute distress, well appearing  does not sound congested    Assessment:     Encounter Diagnoses  Name Primary?   Facial pressure Yes   Nasal congestion        Plan:     We discussed his concerns and symptoms and possible causes.  Doubt tooth decay given good history of regular dental care and no pain with chewing or biting.  Advise nasal saline flush, stop Afrin and do Flonase every night.  Discussed proper use of Flonase.  For the next few days use either Benadryl or Mucinex to help with possible mucus in the sinuses.  Hydrate well.  If not much improved or worsening symptoms suggestive of sinus infection over the next 3 to 5 days then begin Biaxin antibiotic.  Use supportive measures first to see if this helps.  We discussed the risk of overuse of Afrin  If he begins Biaxin advise he stop his cholesterol medicine temporarily while on the  medication   Daniel [name] was seen today for sins infection.  Diagnoses and all orders for this visit:  Facial pressure  Nasal congestion  Other orders -     clarithromycin (BIAXIN) 500 MG tablet; Take 1 tablet (500 mg total) by mouth 2 (two) times daily.  F/u prn

## 2021-10-19 DIAGNOSIS — J3089 Other allergic rhinitis: Secondary | ICD-10-CM | POA: Diagnosis not present

## 2021-10-20 ENCOUNTER — Other Ambulatory Visit: Payer: Self-pay

## 2021-10-20 ENCOUNTER — Other Ambulatory Visit: Payer: Self-pay | Admitting: Medical

## 2021-10-20 MED ORDER — PREDNISONE 10 MG PO TABS: ORAL_TABLET | ORAL | 0 refills | Status: DC

## 2021-10-20 MED ORDER — DOXYCYCLINE MONOHYDRATE 100 MG PO CAPS
100.0000 mg | ORAL_CAPSULE | Freq: Two times a day (BID) | ORAL | 0 refills | Status: DC
Start: 2021-10-20 — End: 2021-12-03

## 2021-10-20 MED ORDER — DOXYCYCLINE HYCLATE 100 MG PO TBEC: 100.0000 mg | DELAYED_RELEASE_TABLET | Freq: Two times a day (BID) | ORAL | 0 refills | Status: DC

## 2021-10-20 NOTE — Telephone Encounter (Signed)
Patient seen on 11/11 for sinus infection states finished clarithromycin yesterday and sinus pressure/ dental pain is still present. Is still using saline and flonase.  Asks if he should go on more antibiotics or cont nasal sprays

## 2021-10-20 NOTE — Telephone Encounter (Signed)
Patient notified   Shortly after received a fax that doxycycline hyclate is not covered bu doxycyline monohydrate is. Pended new rx for review if switch is appropriate

## 2021-10-25 DIAGNOSIS — J3089 Other allergic rhinitis: Secondary | ICD-10-CM | POA: Diagnosis not present

## 2021-11-04 DIAGNOSIS — J3089 Other allergic rhinitis: Secondary | ICD-10-CM | POA: Diagnosis not present

## 2021-11-05 ENCOUNTER — Other Ambulatory Visit: Payer: Self-pay | Admitting: Medical

## 2021-11-05 DIAGNOSIS — E785 Hyperlipidemia, unspecified: Secondary | ICD-10-CM

## 2021-11-17 DIAGNOSIS — J3089 Other allergic rhinitis: Secondary | ICD-10-CM | POA: Diagnosis not present

## 2021-12-02 DIAGNOSIS — J3089 Other allergic rhinitis: Secondary | ICD-10-CM | POA: Diagnosis not present

## 2021-12-03 ENCOUNTER — Encounter: Payer: Self-pay | Admitting: Medical

## 2021-12-03 ENCOUNTER — Telehealth (INDEPENDENT_AMBULATORY_CARE_PROVIDER_SITE_OTHER): Payer: Medicare PPO | Admitting: Medical

## 2021-12-03 ENCOUNTER — Other Ambulatory Visit: Payer: Self-pay

## 2021-12-03 VITALS — BP 124/81 | HR 61 | Wt 200.0 lb

## 2021-12-03 DIAGNOSIS — R519 Headache, unspecified: Secondary | ICD-10-CM

## 2021-12-03 DIAGNOSIS — J329 Chronic sinusitis, unspecified: Secondary | ICD-10-CM | POA: Diagnosis not present

## 2021-12-03 MED ORDER — AMOXICILLIN-POT CLAVULANATE 875-125 MG PO TABS: 1.0000 | ORAL_TABLET | Freq: Two times a day (BID) | ORAL | 0 refills | Status: DC

## 2021-12-03 NOTE — Progress Notes (Signed)
Subjective:     Patient ID: Daniel [name], male   DOB: February 07, 1946, 76 y.o.   MRN: [id number]  This visit type was conducted due to national recommendations for restrictions regarding the COVID-19 Pandemic (e.g. social distancing) in an effort to limit this patient's exposure and mitigate transmission in our community.  Due to their co-morbid illnesses, this patient is at least at moderate risk for complications without adequate follow up.  This format is felt to be most appropriate for this patient at this time.    Documentation for virtual audio and video telecommunications through [place] encounter:  The patient was located at home. The provider was located in the office. The patient did consent to this visit and is aware of possible charges through their insurance for this visit.  The other persons participating in this telemedicine service were none. Time spent on call was 20 minutes and in review of previous records 20 minutes total.  This virtual service is not related to other E/M service within previous 7 days.   HPI Chief Complaint  Patient presents with   Dental Pain    Tooth pain- when bending over tooth start hurting but when standing up he's fine. Thinks it might be sinus related.    Virtual consult for sinus pain.  We did a video visit back October 09, 2019 for sinus infection.  He completed 7 to 10 days of Biaxin.  He did notice improvement to the last few days of the Biaxin.  Things got better for about a week then got worse again.  We did a round of doxycycline at that point.  Felt better by day 5.  But within 3 to 4 days of being off antibiotics symptoms seem to get worse again.  He notes that if he bends over he gets worse sinus pressure.  However standing straight up he does fine.  He also gets teeth discomfort when he bends over or does push-ups with his face down he will feel sinus pressure.  No runny nose.  No sneezing, no fever, no malaise, no ear pressure, no  cough, no sore throat.  Not getting any phlegm.  No change in smell or taste.  No body aches or chills.  Feels fine other than his ongoing sinus pressure.  No temporal pain.  No nosebleed.  He has tried Nasonex, neti pot recently.  He does have an appointment to see a new dentist, Dr. Donn Pierini later this month.  Review of Systems As in subjective    Objective:   Physical Exam Due to coronavirus pandemic stay at home measures, patient visit was virtual and they were not examined in person.   BP 124/81    Pulse 61    Wt 200 lb (90.7 kg)    BMI 27.51 kg/m   General: Well-developed, well-nourished, no acute distress, well appearing  does not sound congested    Assessment:     Encounter Diagnoses  Name Primary?   Chronic sinusitis, unspecified location Yes   Facial pain        Plan:     We discussed his concerns and symptoms and possible causes.  Doubt tooth decay given good history of regular dental care and no pain with chewing or biting.  I am not sure he fully completed the antibiotics but nevertheless he did seem to have improvement while on antibiotics both times.  He finished or released took most of a course of Biaxin and doxycycline separately over the last month.  At this point given ongoing symptoms do still suggest chronic sinusitis will begin antibiotic below.  Prior to this visit there was a listing in the chart for ampicillin allergy.  after discussing this thoroughly today he thinks this was an incorrect listing in the chart record.  He notes the only allergy he has ever had sent an antibiotic with sulfa.  No head injury.  No other new symptoms.  Advised good hydration, can use the Nasonex.  Advised to call back in 3 to 4 days.  If still no improvement either referral to ENT or CT scan of sinuses.  Nahshon was seen today for dental pain.  Diagnoses and all orders for this visit:  Chronic sinusitis, unspecified location  Facial pain  Other orders -      amoxicillin-clavulanate (AUGMENTIN) 875-125 MG tablet; Take 1 tablet by mouth 2 (two) times daily.  F/u with call back next week

## 2021-12-08 ENCOUNTER — Telehealth: Payer: Self-pay | Admitting: Medical

## 2021-12-08 NOTE — Telephone Encounter (Signed)
Pt called and states that the new medication has not made any change concerning sinus issue. He did have an appt with Dr. Sue Lush who is a Endodontists who took rays to make sure issue was not a dental issue. Per pt Dr. Sue Lush stated not a dental issue but could see that his sinuses were cloudy. Dr. Sue Lush place pt on a dose pack of prednisone and instructed patient to take with the antibiotic pt was placed on by [facility]. Pt can be reached at [phone].

## 2021-12-08 NOTE — Telephone Encounter (Signed)
Pt started on prednisone yesterday and he is taking both and will give a few more days to see if hes feeling better. He will call Friday if hes not any better

## 2021-12-10 ENCOUNTER — Telehealth: Payer: Self-pay

## 2021-12-10 NOTE — Telephone Encounter (Signed)
Pt. Called stating he was told to call back and let you know he is doing on Friday. He said he is doing much better and thanks you for everything you did for him.

## 2021-12-13 ENCOUNTER — Other Ambulatory Visit: Payer: Self-pay | Admitting: General Practice

## 2021-12-13 DIAGNOSIS — I10 Essential (primary) hypertension: Secondary | ICD-10-CM

## 2021-12-16 DIAGNOSIS — J3089 Other allergic rhinitis: Secondary | ICD-10-CM | POA: Diagnosis not present

## 2021-12-22 ENCOUNTER — Ambulatory Visit
Admission: RE | Admit: 2021-12-22 | Discharge: 2021-12-22 | Disposition: A | Discharge status: Home / Self Care | Payer: Medicare PPO | Source: Ambulatory Visit | Attending: Medical | Admitting: Medical

## 2021-12-22 DIAGNOSIS — M533 Sacrococcygeal disorders, not elsewhere classified: Secondary | ICD-10-CM

## 2021-12-22 DIAGNOSIS — Z13820 Encounter for screening for osteoporosis: Secondary | ICD-10-CM

## 2021-12-22 DIAGNOSIS — N1831 Chronic kidney disease, stage 3a: Secondary | ICD-10-CM

## 2021-12-22 DIAGNOSIS — Z0389 Encounter for observation for other suspected diseases and conditions ruled out: Secondary | ICD-10-CM | POA: Diagnosis not present

## 2021-12-22 DIAGNOSIS — M5030 Other cervical disc degeneration, unspecified cervical region: Secondary | ICD-10-CM

## 2021-12-22 DIAGNOSIS — R29890 Loss of height: Secondary | ICD-10-CM

## 2021-12-30 DIAGNOSIS — J3089 Other allergic rhinitis: Secondary | ICD-10-CM | POA: Diagnosis not present

## 2022-01-03 ENCOUNTER — Ambulatory Visit (INDEPENDENT_AMBULATORY_CARE_PROVIDER_SITE_OTHER): Payer: Medicare PPO | Admitting: Medical

## 2022-01-03 ENCOUNTER — Other Ambulatory Visit: Payer: Self-pay

## 2022-01-03 VITALS — BP 130/82 | HR 76 | Temp 97.1°F | Wt 204.4 lb

## 2022-01-03 DIAGNOSIS — H6121 Impacted cerumen, right ear: Secondary | ICD-10-CM | POA: Diagnosis not present

## 2022-01-03 DIAGNOSIS — K0889 Other specified disorders of teeth and supporting structures: Secondary | ICD-10-CM | POA: Diagnosis not present

## 2022-01-03 DIAGNOSIS — J3489 Other specified disorders of nose and nasal sinuses: Secondary | ICD-10-CM | POA: Insufficient documentation

## 2022-01-03 DIAGNOSIS — M47812 Spondylosis without myelopathy or radiculopathy, cervical region: Secondary | ICD-10-CM | POA: Diagnosis not present

## 2022-01-03 DIAGNOSIS — R52 Pain, unspecified: Secondary | ICD-10-CM | POA: Diagnosis not present

## 2022-01-03 DIAGNOSIS — J329 Chronic sinusitis, unspecified: Secondary | ICD-10-CM | POA: Insufficient documentation

## 2022-01-03 DIAGNOSIS — L608 Other nail disorders: Secondary | ICD-10-CM | POA: Insufficient documentation

## 2022-01-03 MED ORDER — TRAMADOL HCL 50 MG PO TABS: 50.0000 mg | ORAL_TABLET | Freq: Every day | ORAL | 0 refills | Status: AC | PRN

## 2022-01-03 NOTE — Progress Notes (Signed)
Subjective:  Daniel [name] is a 76 y.o. male who presents for Chief Complaint  Patient presents with   SINUS ISSUES    Sinus issues going on 2 months.      Here for multiple issues.  Here for recurrent sinus problems.  We did a virtual visit in November 2022 for sinus infection.  He ended up doing a round of Biaxin.  After a period of time and not improving we did a round of doxycycline.  Things improved.  Then I saw him again virtually in January for sinus infection.  He went a few months without any problem.  In January we did a round of Augmentin on December 03, 2021.  He did not feel completely resolved so he went back and saw his endodontist.  An x-ray was taken and there was a concern for ongoing maxillary sinus infection.  He just started on a second round of Augmentin plus prednisone added on as well.  He is on day 4 of this treatment.  He is concerned that he has continued to have sinus problems since November.  He notes that sometimes the crown he has on his upper and lower teeth will be sensitive but then 15 minutes later they can feel fine.  He is using nasal saline and a Vicks inhaler.  Currently he notes that he gets a pulsating sensation when he bends his head over.  If he sits in the same position for a while it goes away.  However if he simply bends over he gets the pulsation but when he raises up it is less noticeable.  He does not describe this as sinus pressure or headache.  No ear pain, no mucus discharge, no nausea, no sore throat, no dizziness.  He denies hearing change, no confusion, no slurred speech, no numbness tingling or weakness.  He wants his toenails looked at.  He notes some thickening and increased in the great toenails bilaterally.  He has tried some over-the-counter topical antifungal occasionally.  Lately he has had pains in his neck.  He is taking over-the-counter Tylenol without relief.  Because of abnormal kidney marker he does not want to take anything this  can harm the kidneys.  No other aggravating or relieving factors.    No other c/o.  Past Medical History:  Diagnosis Date   Abdominal pain    with coffee   Abdominal ultrasound, abnormal 12/09/09   fatty liver disease   Abnormal carotid ultrasound 03/01/07   R no ICA stenosis. 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 Mosetta Anis;   Arthritis    osteoarthritis back/spine   Basal cell carcinoma 06/15/2020   Left Breast (nodular)   Diverticulosis    Dyslipidemia    Dysplastic nevus 03/01/1993   slight, left back and mid lower back, no treatment   Dysplastic nevus 03/04/2004   moderate-marked, low mid back, excision   Dysplastic nevus 06/14/2010   mild-mod, left hip, wider shave   Fatty liver    H/O CT scan of abdomen 03/20/12   abdomen/pelvis - normal   H/O CT scan of chest 03/20/12   normal   H/O diagnostic ultrasound 03/06/11   Aortic ultrasound - no AAA identified   H/O echocardiogram 10/18/2011   transthoracic - mild concentric LVH, EF 55%, mild valvular aortic stenosis, LA mild to moderate dilation, otherwise normal echo; Dr. Rollene Fare   Heart murmur, aortic    cardiac eval  09/2011, Dr. Gwenlyn [facility]), mild aortic stenosis   Hyperlipidemia    Hypertension    Inguinal hernia 2012   right   Normal cardiac stress test 03/02/07   Myoview, EF 65%; Dr. 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 Alinda Money   Current Outpatient Medications on File Prior to Visit  Medication Sig Dispense Refill   amoxicillin-clavulanate (AUGMENTIN) 875-125 MG tablet Take 1 tablet by mouth 2 (two) times daily. 20 tablet 0   co-enzyme Q-10 30 MG capsule Take 30 mg by mouth 3 (three) times daily.     cyclobenzaprine (FLEXERIL) 10 MG tablet Take 1 tablet (10 mg total) by mouth at bedtime. Pt. Takes 1/2  tablet only if needed 45 tablet 1   ezetimibe (ZETIA) 10 MG tablet TAKE 1 TABLET(10 MG) BY MOUTH DAILY 90 tablet 0   metoprolol succinate (TOPROL-XL) 50 MG 24 hr tablet TAKE 1 TABLET BY MOUTH EVERY EVENING AND 1/2 TABLET(25 MG TOTAL) EVERY MORNING. TAKE WITH OR IMMEDIATELY FOLLOWING A MEAL 135 tablet 3   Multiple Vitamin (MULTIVITAMIN) tablet Take 1 tablet by mouth daily.     pregabalin (LYRICA) 75 MG capsule Take 75 mg by mouth daily.     rosuvastatin (CRESTOR) 10 MG tablet Take 1 tablet (10 mg total) by mouth every other day. 90 tablet 0   vitamin B-12 (CYANOCOBALAMIN) 100 MCG tablet Take 100 mcg by mouth daily.     EPINEPHrine 0.3 mg/0.3 mL IJ SOAJ injection      No current facility-administered medications on file prior to visit.     The following portions of the patient's history were reviewed and updated as appropriate: allergies, current medications, past family history, past medical history, past social history, past surgical history and problem list.  ROS Otherwise as in subjective above    Objective: BP 130/82    Pulse 76    Temp (!) 97.1 F (36.2 C)    Wt 204 lb 6.4 oz (92.7 kg)    BMI 28.11 kg/m   General appearance: alert, no distress, well developed, well nourished HEENT: normocephalic, sclerae anicteric, conjunctiva pink and moist, TMs pearly, nares patent, no discharge or erythema, pharynx normal Oral cavity: MMM, no lesions, teeth in good repair Neck: supple, no lymphadenopathy, no thyromegaly, no masses Pulses: 2+ radial pulses, 2+ pedal pulses, normal cap refill Ext: no edema Neuro: CN II through XII intact    Assessment: Encounter Diagnoses  Name Primary?   Sinus pressure Yes   Recurrent sinusitis    Toenail deformity    Throbbing pain    Neck arthritis    Impacted cerumen of right ear    Tooth pain      Plan: Sinus pressure, recurrent sinusitis-we discussed his symptoms.  He is attributing to all this is a ongoing problem although I treated him in  November and he got better.  He had a new acute sinus infection in January.  He is on the second round of antibiotics (Augmentin) and a round of oral steroids for this.  Previously in November 2022 he ended up being on Biaxin and doxycycline separately during that episode.  He is on day 4 of his current antibiotic treatment per endodontist.  I advise he call report in 1 to 2 weeks.  If not resolving we can pursue ENT consult or CT of the  head.  Some of his symptoms are unclear such as a throbbing sensation that is intermittent.  He does have quite a bit of earwax on the right, impacted.  Discussed findings.  Discussed risk/benefits of procedure and patient agrees to procedure. Successfully used warm water lavage to remove impacted cerumen from right ear canal. Patient tolerated procedure well. Advised they avoid using any cotton swabs or other devices to clean the ear canals.  Use basic hygiene as discussed.  Follow up prn.    Toenail deformity-the appearance of the toenails would suggest that he may have had some toenail fungus that is trying to heal.  All the nail behind the crease is new healthy looking nail or as the crease distally looks possibly fungal but that is growing outward.  For now I would recommend we just use a watch and wait approach  Neck arthritis-I reviewed his x-ray of his C-spine from 2017.  Given his kidney function we want to avoid NSAIDs.  Tylenol is not helping so I gave him for short-term Ultram to use as needed for worse pain.  If this continues to be an issue we will refer to orthopedics  Tooth discomfort-we discussed his recent visit with the dentist I recommend he go see.  He was not happy with that visit.  I recommended he call them back and discuss with them.   His initial visit was with the hygienist for cleaning and he feels like he did not get an adequate exam by the dentist.   Justice was seen today for sinus issues.  Diagnoses and all orders for this visit:  Sinus  pressure  Recurrent sinusitis  Toenail deformity  Throbbing pain  Neck arthritis  Impacted cerumen of right ear  Tooth pain  Other orders -     traMADol (ULTRAM) 50 MG tablet; Take 1 tablet (50 mg total) by mouth daily as needed for up to 5 days.    Follow up: 2 weeks

## 2022-01-12 DIAGNOSIS — J3089 Other allergic rhinitis: Secondary | ICD-10-CM | POA: Diagnosis not present

## 2022-01-18 ENCOUNTER — Telehealth: Payer: Self-pay | Admitting: Medical

## 2022-01-18 ENCOUNTER — Encounter: Payer: Self-pay | Admitting: Internal Medicine

## 2022-01-18 NOTE — Telephone Encounter (Signed)
Spoke with patient to schedule Medicare Annual Wellness Visit (AWV) either virtually or in office. I Patient wanted call back in June  he stated he has had a lot appt right now   Last AWV ;06/24/21\ please schedule at anytime with health coach  This should be a 45 minute visit.    Awv can be scheduled calendar year [place]

## 2022-01-26 DIAGNOSIS — J3089 Other allergic rhinitis: Secondary | ICD-10-CM | POA: Diagnosis not present

## 2022-01-31 ENCOUNTER — Other Ambulatory Visit: Payer: Self-pay

## 2022-01-31 ENCOUNTER — Ambulatory Visit (HOSPITAL_COMMUNITY): Payer: Medicare PPO | Attending: Internal Medicine

## 2022-01-31 DIAGNOSIS — I350 Nonrheumatic aortic (valve) stenosis: Secondary | ICD-10-CM | POA: Diagnosis not present

## 2022-01-31 DIAGNOSIS — I10 Essential (primary) hypertension: Secondary | ICD-10-CM | POA: Insufficient documentation

## 2022-01-31 LAB — ECHOCARDIOGRAM COMPLETE
AR max vel: 1.43 cm2
AV Area VTI: 1.55 cm2
AV Area mean vel: 1.35 cm2
AV Mean grad: 13 mmHg
AV Peak grad: 24.4 mmHg
Ao pk vel: 2.47 m/s
Area-P 1/2: 3.12 cm2
S' Lateral: 2.3 cm

## 2022-02-02 DIAGNOSIS — J3089 Other allergic rhinitis: Secondary | ICD-10-CM | POA: Diagnosis not present

## 2022-02-02 DIAGNOSIS — R972 Elevated prostate specific antigen [PSA]: Secondary | ICD-10-CM | POA: Diagnosis not present

## 2022-02-05 IMAGING — US US THYROID
1 series · 13 of 25 positions shown · non-contrast
Comparison: 07/10/2019, 06/11/2019

CLINICAL DATA: 74-year-old male with a history of thyroid nodules

EXAM:
THYROID ULTRASOUND
TECHNIQUE: Ultrasound examination of the thyroid gland and adjacent soft
tissues was performed.

[Series 1: us thyroid · 0.05mm/px · 13 of 85 slices shown]
[im 1/85]
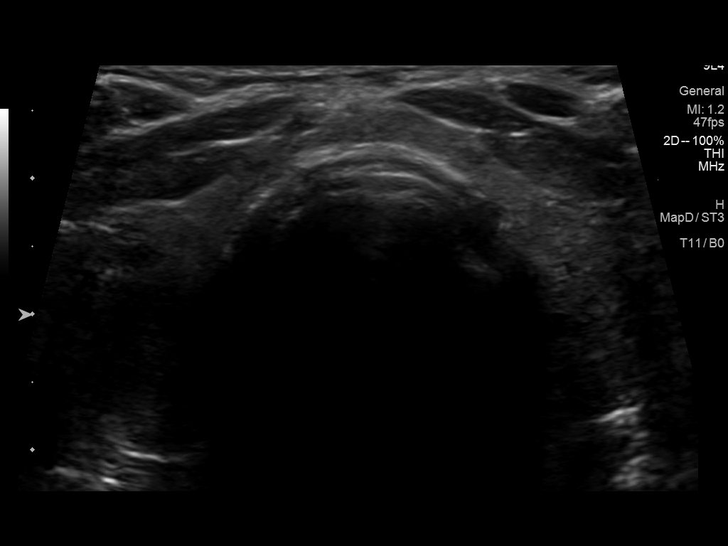
[im 8/85]
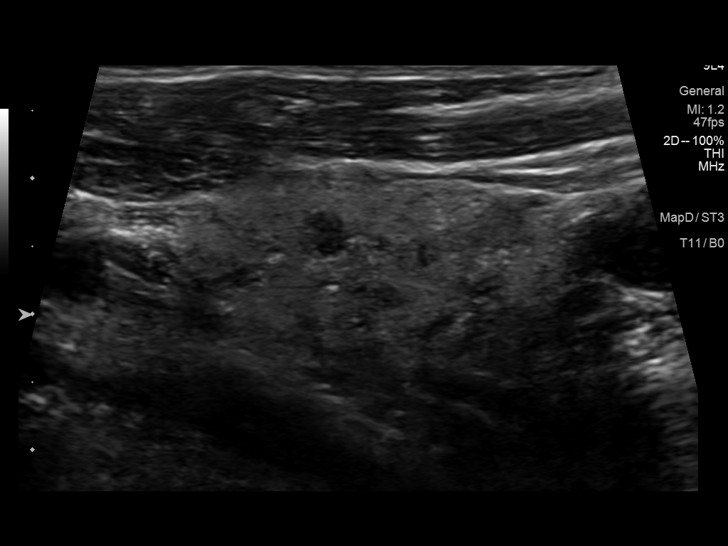
[im 15/85]
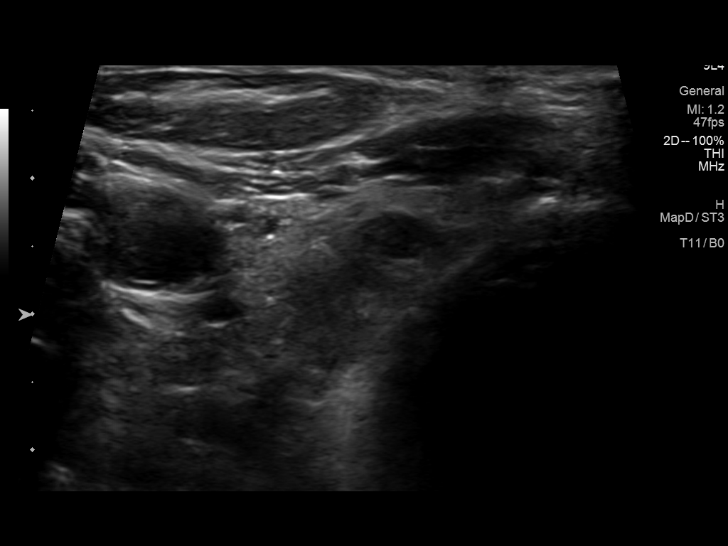
[im 22/85]
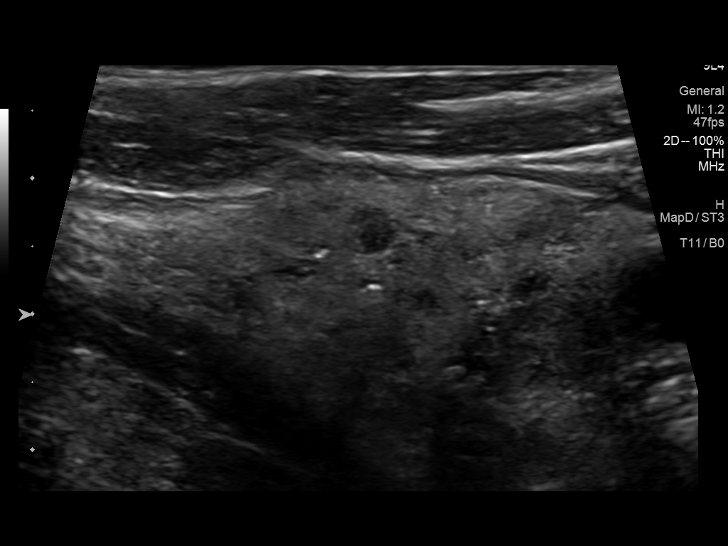
[im 29/85]
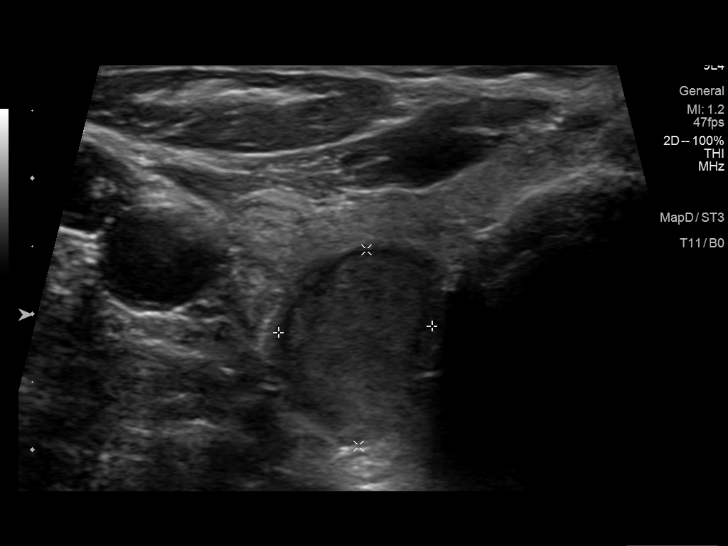
[im 36/85]
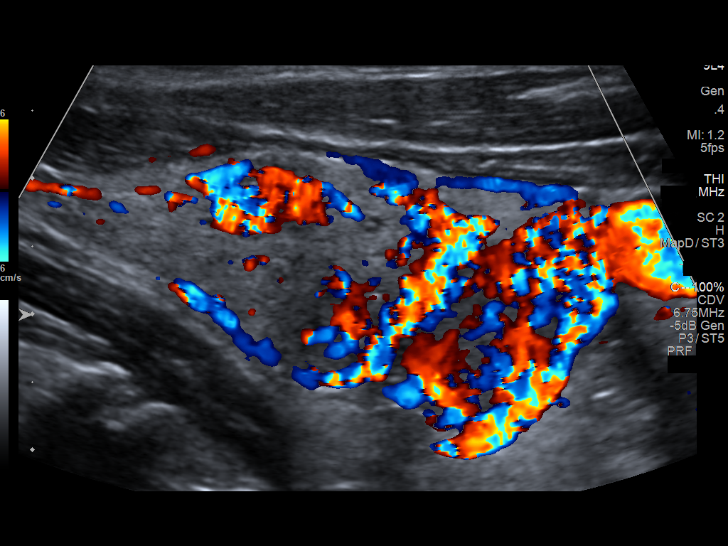
[im 43/85]
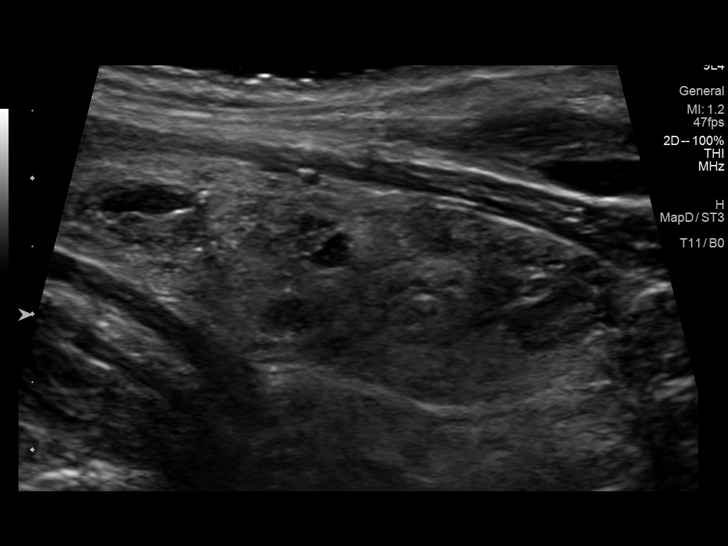
[im 50/85]
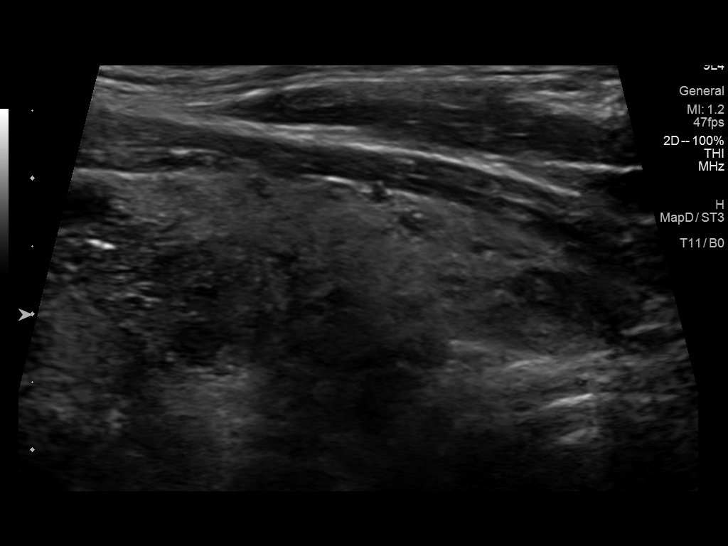
[im 57/85]
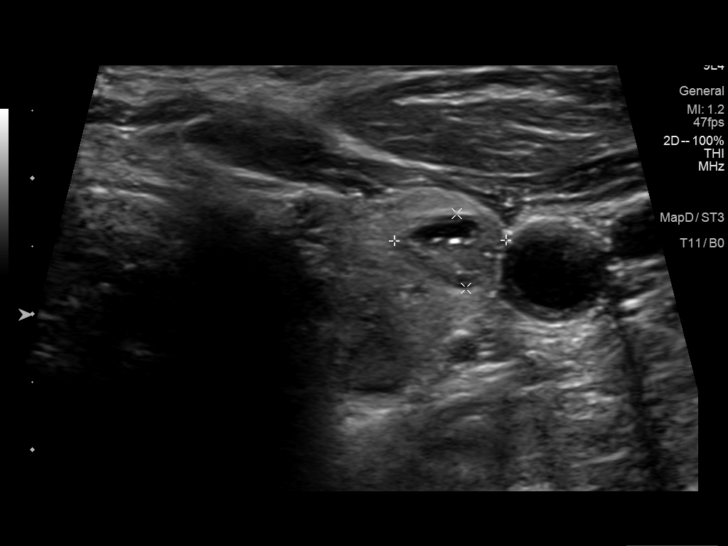
[im 64/85]
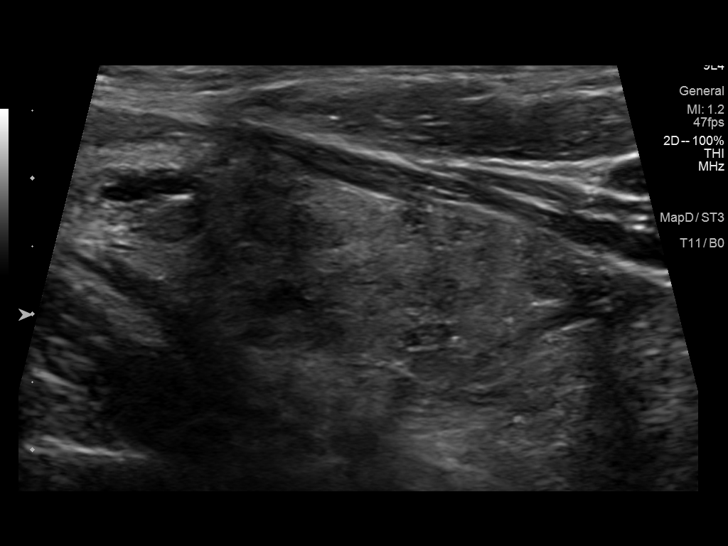
[im 71/85]
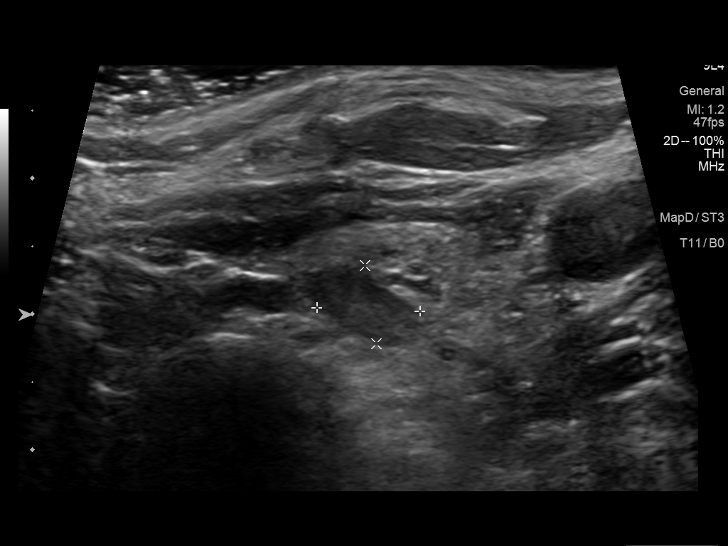
[im 78/85]
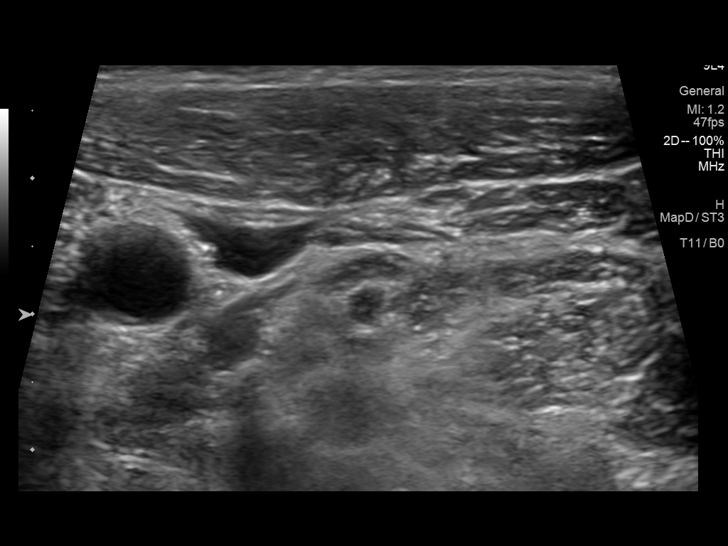
[im 85/85]
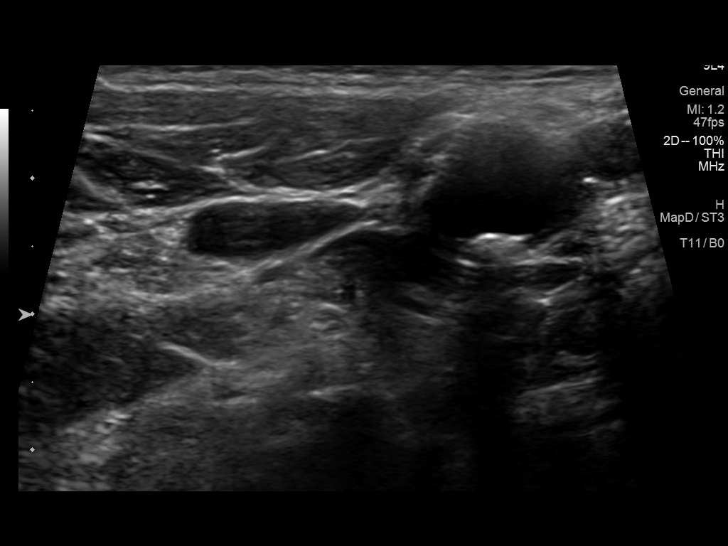

[13 of 25 positions shown; findings below may reference images not displayed]

FINDINGS: Parenchymal Echotexture: Moderately heterogenous

Isthmus: 0.3 cm

Right lobe: 4.3 cm x 2.7 cm x 1.8 cm

Left lobe: 4.6 cm x 1.5 cm x 1.9 cm

_________________________________________________________

Estimated total number of nodules >/= 1 cm: 3

Number of spongiform nodules >/=  2 cm not described below (TR1): 0

Number of mixed cystic and solid nodules >/= 1.5 cm not described
below (TR2): 0

_________________________________________________________

Nodule labeled 1 superior right thyroid, 9 mm, is unchanged and does
not meet criteria for surveillance or biopsy.

Nodule labeled 2 mid right thyroid, 7 mm, is unchanged and does not
meet criteria for surveillance or biopsy

Nodule labeled 3 inferior right thyroid maximum diameter 1.45 cm, TR
4. Nodule meets criteria for continued surveillance.

Nodule labeled 5 superior left thyroid, 1.0 cm. Nodule remains TR 3
and does not meet criteria for surveillance or biopsy.

Nodule labeled 6, mid left thyroid, unchanged in size 1.1 cm. This
nodule was previously biopsied 07/10/2019. Assuming benign result no
further specific follow-up would be indicated.

Nodule labeled 7 inferior left thyroid, 8 mm, TR 3 and does not meet
criteria for surveillance or biopsy.

Nodule labeled 8, inferior left thyroid, 9 mm. Nodule is TR 4 and
does not meet criteria for surveillance or biopsy.

No adenopathy
IMPRESSION: Multinodular thyroid again demonstrated.

Right inferior thyroid nodule (labeled 3, 1.45 cm, TR 4) meets
criteria for continued surveillance, as designated by the newly
established ACR TI-RADS criteria. Surveillance ultrasound study
recommended to be performed annually up to 5 years.

Recommendations follow those established by the new ACR TI-RADS
criteria ([HOSPITAL] 8791;[DATE]).

## 2022-02-09 DIAGNOSIS — N400 Enlarged prostate without lower urinary tract symptoms: Secondary | ICD-10-CM | POA: Diagnosis not present

## 2022-02-09 DIAGNOSIS — R972 Elevated prostate specific antigen [PSA]: Secondary | ICD-10-CM | POA: Diagnosis not present

## 2022-02-10 DIAGNOSIS — J3089 Other allergic rhinitis: Secondary | ICD-10-CM | POA: Diagnosis not present

## 2022-02-10 DIAGNOSIS — J301 Allergic rhinitis due to pollen: Secondary | ICD-10-CM | POA: Diagnosis not present

## 2022-02-11 ENCOUNTER — Other Ambulatory Visit: Payer: Self-pay | Admitting: Medical

## 2022-02-11 DIAGNOSIS — E785 Hyperlipidemia, unspecified: Secondary | ICD-10-CM

## 2022-02-18 DIAGNOSIS — J3089 Other allergic rhinitis: Secondary | ICD-10-CM | POA: Diagnosis not present

## 2022-02-18 DIAGNOSIS — J301 Allergic rhinitis due to pollen: Secondary | ICD-10-CM | POA: Diagnosis not present

## 2022-02-18 DIAGNOSIS — J3081 Allergic rhinitis due to animal (cat) (dog) hair and dander: Secondary | ICD-10-CM | POA: Diagnosis not present

## 2022-02-23 DIAGNOSIS — J3089 Other allergic rhinitis: Secondary | ICD-10-CM | POA: Diagnosis not present

## 2022-03-10 DIAGNOSIS — J3081 Allergic rhinitis due to animal (cat) (dog) hair and dander: Secondary | ICD-10-CM | POA: Diagnosis not present

## 2022-03-10 DIAGNOSIS — J301 Allergic rhinitis due to pollen: Secondary | ICD-10-CM | POA: Diagnosis not present

## 2022-03-10 DIAGNOSIS — J3089 Other allergic rhinitis: Secondary | ICD-10-CM | POA: Diagnosis not present

## 2022-03-23 DIAGNOSIS — J3081 Allergic rhinitis due to animal (cat) (dog) hair and dander: Secondary | ICD-10-CM | POA: Diagnosis not present

## 2022-03-23 DIAGNOSIS — J301 Allergic rhinitis due to pollen: Secondary | ICD-10-CM | POA: Diagnosis not present

## 2022-03-23 DIAGNOSIS — J3089 Other allergic rhinitis: Secondary | ICD-10-CM | POA: Diagnosis not present

## 2022-04-06 DIAGNOSIS — J3089 Other allergic rhinitis: Secondary | ICD-10-CM | POA: Diagnosis not present

## 2022-04-07 DIAGNOSIS — J3081 Allergic rhinitis due to animal (cat) (dog) hair and dander: Secondary | ICD-10-CM | POA: Diagnosis not present

## 2022-04-07 DIAGNOSIS — J3089 Other allergic rhinitis: Secondary | ICD-10-CM | POA: Diagnosis not present

## 2022-04-13 DIAGNOSIS — J320 Chronic maxillary sinusitis: Secondary | ICD-10-CM | POA: Diagnosis not present

## 2022-04-13 DIAGNOSIS — J310 Chronic rhinitis: Secondary | ICD-10-CM | POA: Diagnosis not present

## 2022-04-13 DIAGNOSIS — J343 Hypertrophy of nasal turbinates: Secondary | ICD-10-CM | POA: Diagnosis not present

## 2022-04-21 DIAGNOSIS — J3081 Allergic rhinitis due to animal (cat) (dog) hair and dander: Secondary | ICD-10-CM | POA: Diagnosis not present

## 2022-04-21 DIAGNOSIS — J3089 Other allergic rhinitis: Secondary | ICD-10-CM | POA: Diagnosis not present

## 2022-04-21 DIAGNOSIS — J301 Allergic rhinitis due to pollen: Secondary | ICD-10-CM | POA: Diagnosis not present

## 2022-05-05 DIAGNOSIS — J301 Allergic rhinitis due to pollen: Secondary | ICD-10-CM | POA: Diagnosis not present

## 2022-05-05 DIAGNOSIS — J3081 Allergic rhinitis due to animal (cat) (dog) hair and dander: Secondary | ICD-10-CM | POA: Diagnosis not present

## 2022-05-05 DIAGNOSIS — J3089 Other allergic rhinitis: Secondary | ICD-10-CM | POA: Diagnosis not present

## 2022-05-18 DIAGNOSIS — J3089 Other allergic rhinitis: Secondary | ICD-10-CM | POA: Diagnosis not present

## 2022-05-18 DIAGNOSIS — J301 Allergic rhinitis due to pollen: Secondary | ICD-10-CM | POA: Diagnosis not present

## 2022-05-18 DIAGNOSIS — J3081 Allergic rhinitis due to animal (cat) (dog) hair and dander: Secondary | ICD-10-CM | POA: Diagnosis not present

## 2022-06-02 DIAGNOSIS — J3081 Allergic rhinitis due to animal (cat) (dog) hair and dander: Secondary | ICD-10-CM | POA: Diagnosis not present

## 2022-06-02 DIAGNOSIS — J301 Allergic rhinitis due to pollen: Secondary | ICD-10-CM | POA: Diagnosis not present

## 2022-06-02 DIAGNOSIS — J3089 Other allergic rhinitis: Secondary | ICD-10-CM | POA: Diagnosis not present

## 2022-06-15 ENCOUNTER — Encounter: Payer: Self-pay | Admitting: Dermatology

## 2022-06-15 ENCOUNTER — Ambulatory Visit (INDEPENDENT_AMBULATORY_CARE_PROVIDER_SITE_OTHER): Payer: Medicare PPO | Admitting: Dermatology

## 2022-06-15 DIAGNOSIS — Z1283 Encounter for screening for malignant neoplasm of skin: Secondary | ICD-10-CM

## 2022-06-15 DIAGNOSIS — L821 Other seborrheic keratosis: Secondary | ICD-10-CM | POA: Diagnosis not present

## 2022-06-15 DIAGNOSIS — D1801 Hemangioma of skin and subcutaneous tissue: Secondary | ICD-10-CM | POA: Diagnosis not present

## 2022-06-15 DIAGNOSIS — L570 Actinic keratosis: Secondary | ICD-10-CM | POA: Diagnosis not present

## 2022-06-16 ENCOUNTER — Other Ambulatory Visit: Payer: Self-pay | Admitting: Medical

## 2022-06-16 DIAGNOSIS — J3081 Allergic rhinitis due to animal (cat) (dog) hair and dander: Secondary | ICD-10-CM | POA: Diagnosis not present

## 2022-06-16 DIAGNOSIS — E785 Hyperlipidemia, unspecified: Secondary | ICD-10-CM

## 2022-06-16 DIAGNOSIS — J301 Allergic rhinitis due to pollen: Secondary | ICD-10-CM | POA: Diagnosis not present

## 2022-06-16 DIAGNOSIS — J3089 Other allergic rhinitis: Secondary | ICD-10-CM | POA: Diagnosis not present

## 2022-06-20 ENCOUNTER — Telehealth: Payer: Self-pay | Admitting: Medical

## 2022-06-20 NOTE — Telephone Encounter (Signed)
Left message for patient to call back and schedule Medicare Annual Wellness Visit (AWV) either virtually or in office. I left my number for patient to call [phone].  Last AWV ;06/24/21  please schedule at anytime with health coach

## 2022-07-01 ENCOUNTER — Ambulatory Visit (INDEPENDENT_AMBULATORY_CARE_PROVIDER_SITE_OTHER): Payer: Medicare PPO

## 2022-07-01 VITALS — BP 119/72 | HR 69 | Ht 71.5 in | Wt 200.0 lb

## 2022-07-01 DIAGNOSIS — Z0000 Encounter for general adult medical examination without abnormal findings: Secondary | ICD-10-CM

## 2022-07-01 NOTE — Progress Notes (Signed)
I connected with Daniel [name] today by telephone and verified that I am speaking with the correct person using two identifiers. Location patient: home Location provider: work Persons participating in the virtual visit: Santiago, Stenzel LPN.   I discussed the limitations, risks, security and privacy concerns of performing an evaluation and management service by telephone and the availability of in person appointments. I also discussed with the patient that there may be a patient responsible charge related to this service. The patient expressed understanding and verbally consented to this telephonic visit.    Interactive audio and video telecommunications were attempted between this provider and patient, however failed, due to patient having technical difficulties OR patient did not have access to video capability.  We continued and completed visit with audio only.     Vital signs may be patient reported or missing.  Subjective:   Daniel [name] is a 76 y.o. male who presents for Medicare Annual/Subsequent preventive examination.  Review of Systems     Cardiac Risk Factors include: advanced age (>10men, >61 women);dyslipidemia;hypertension;male gender     Objective:    Today's Vitals   07/01/22 0941  BP: 119/72  Pulse: 69  Weight: 200 lb (90.7 kg)  Height: 5' 11.5" (1.816 m)  PainSc: 1    Body mass index is 27.51 kg/m.     07/01/2022    9:57 AM 06/24/2021    9:58 AM 06/24/2021    9:50 AM 03/19/2021   11:14 AM 07/13/2016    2:15 PM 11/19/2015   10:43 AM 07/30/2014    9:44 AM  Advanced Directives  Does Patient Have a Medical Advance Directive? No No No No No No No  Would patient like information on creating a medical advance directive?  No - Patient declined No - Patient declined No - Patient declined No - patient declined information Yes - Educational materials given     Current Medications (verified) Outpatient Encounter Medications as of 07/01/2022  Medication  Sig   co-enzyme Q-10 30 MG capsule Take 30 mg by mouth 3 (three) times daily.   cyclobenzaprine (FLEXERIL) 10 MG tablet Take 1 tablet (10 mg total) by mouth at bedtime. Pt. Takes 1/2 tablet only if needed   EPINEPHrine 0.3 mg/0.3 mL IJ SOAJ injection    ezetimibe (ZETIA) 10 MG tablet TAKE 1 TABLET(10 MG) BY MOUTH DAILY   metoprolol succinate (TOPROL-XL) 50 MG 24 hr tablet TAKE 1 TABLET BY MOUTH EVERY EVENING AND 1/2 TABLET(25 MG TOTAL) EVERY MORNING. TAKE WITH OR IMMEDIATELY FOLLOWING A MEAL   Multiple Vitamin (MULTIVITAMIN) tablet Take 1 tablet by mouth daily.   pregabalin (LYRICA) 75 MG capsule Take 75 mg by mouth daily.   vitamin B-12 (CYANOCOBALAMIN) 100 MCG tablet Take 100 mcg by mouth daily.   amoxicillin-clavulanate (AUGMENTIN) 875-125 MG tablet Take 1 tablet by mouth 2 (two) times daily. (Patient not taking: Reported on 07/01/2022)   rosuvastatin (CRESTOR) 10 MG tablet Take 1 tablet (10 mg total) by mouth every other day. (Patient not taking: Reported on 06/15/2022)   No facility-administered encounter medications on file as of 07/01/2022.    Allergies (verified) Codeine, Lipitor [atorvastatin], and Sulfa antibiotics   History: Past Medical History:  Diagnosis Date   Abdominal pain    with coffee   Abdominal ultrasound, abnormal 12/09/09   fatty liver disease   Abnormal carotid ultrasound 03/01/07   R no ICA stenosis. Left 40-60% ICA stenosis, no significant plaques   Allergy    allergy shots, cat  allergy; Dr. Mosetta Anis;   Arthritis    osteoarthritis back/spine   Basal cell carcinoma 06/15/2020   Left Breast (nodular)   Diverticulosis    Dyslipidemia    Dysplastic nevus 03/01/1993   slight, left back and mid lower back, no treatment   Dysplastic nevus 03/04/2004   moderate-marked, low mid back, excision   Dysplastic nevus 06/14/2010   mild-mod, left hip, wider shave   Fatty liver    H/O CT scan of abdomen 03/20/12   abdomen/pelvis - normal   H/O CT scan of chest 03/20/12    normal   H/O diagnostic ultrasound 03/06/11   Aortic ultrasound - no AAA identified   H/O echocardiogram 10/18/2011   transthoracic - mild concentric LVH, EF 55%, mild valvular aortic stenosis, LA mild to moderate dilation, otherwise normal echo; Dr. Rollene Fare   Heart murmur, aortic    cardiac eval 09/2011, Dr. Gwenlyn Found [facility]), mild aortic stenosis   Hyperlipidemia    Hypertension    Inguinal hernia 2012   right   Normal cardiac stress test 03/02/07   Myoview, EF 65%; Dr. 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 Alinda Money   Past Surgical History:  Procedure Laterality Date   COLONOSCOPY  10/21/2008   internal hemorrhoids, diverticulosis; repeat 10/2018; Dr. Scarlette Shorts, [place]   HAND SURGERY  2004   left ganglion cyst removed    NOSE SURGERY  2005   obstruction, polyp and deviated septum; Dr. Omar Person SURGERY  2002, 2008   bilat, RTC   Family History  Problem Relation Age of Onset   Cancer Mother        breast   Dementia Mother 48   Kidney disease Father 44       kidney failure   Rheum arthritis Father    Diabetes Neg Hx    Heart disease Neg Hx    Stroke Neg Hx    Hypertension Neg Hx    Hyperlipidemia Neg Hx    Colon cancer Neg Hx    Colon polyps Neg Hx    Esophageal cancer Neg Hx    Rectal cancer Neg Hx    Stomach cancer Neg Hx    Social History   Socioeconomic History   Marital status: Married    Spouse name: Not on file   Number of children: Not on file   Years of education: Not on file   Highest education level: Not on file  Occupational History   Occupation: Presenter, broadcasting; part time university [employer]    Employer: [employer]  Tobacco Use   Smoking status: Never   Smokeless tobacco: Never  Vaping Use   Vaping Use: Never used  Substance and Sexual Activity   Alcohol use: Yes    Alcohol/week: 2.0  standard drinks of alcohol    Types: 2 Cans of beer per week    Comment: non alcoholic beer   Drug use: No   Sexual activity: Yes  Other Topics Concern   Not on file  Social History Narrative   Married, no children, 2 dogs and a cat, exercise - aerobics, some weight bearing   Social Determinants of Health   Financial Resource Strain: Low Risk  (07/01/2022)   Overall Financial Resource Strain (CARDIA)    Difficulty of Paying Living Expenses: Not hard at all  Food Insecurity: No Food Insecurity (07/01/2022)   Hunger Vital Sign    Worried About Running Out of Food in the Last Year: Never true    Ran Out of Food in the Last Year: Never true  Transportation Needs: No Transportation Needs (07/01/2022)   PRAPARE - Administrator, Civil Service (Medical): No    Lack of Transportation (Non-Medical): No  Physical Activity: Insufficiently Active (07/01/2022)   Exercise Vital Sign    Days of Exercise per Week: 5 days    Minutes of Exercise per Session: 20 min  Stress: No Stress Concern Present (07/01/2022)   [employer] of Occupational Health - Occupational Stress Questionnaire    Feeling of Stress : Not at all  Social Connections: Not on file    Tobacco Counseling Counseling given: Not Answered   Clinical Intake:  Pre-visit preparation completed: Yes  Pain : 0-10 Pain Score: 1  Pain Type: Chronic pain Pain Location: Mouth Pain Orientation: Left Pain Descriptors / Indicators: Discomfort Pain Onset: More than a month ago Pain Frequency: Intermittent     Nutritional Status: BMI 25 -29 Overweight Nutritional Risks: None Diabetes: No  How often do you need to have someone help you when you read instructions, pamphlets, or other written materials from your doctor or pharmacy?: 1 - Never What is the last grade level you completed in school?: bachelors degree  Diabetic? no  Interpreter Needed?: No  Information entered by :: NAllen LPN   Activities of Daily  Living    07/01/2022   10:00 AM  In your present state of health, do you have any difficulty performing the following activities:  Hearing? 0  Vision? 0  Difficulty concentrating or making decisions? 0  Walking or climbing stairs? 0  Dressing or bathing? 0  Doing errands, shopping? 0  Preparing Food and eating ? N  Using the Toilet? N  In the past six months, have you accidently leaked urine? N  Do you have problems with loss of bowel control? N  Managing your Medications? N  Managing your Finances? N  Housekeeping or managing your Housekeeping? N    Patient Care Team: Tysinger, Kermit Balo, PA-C as PCP - General (Family Medicine) Runell Gess, MD as PCP - Cardiology (Cardiology) Sharrell Ku, MD as Referring Physician (Gastroenterology) Runell Gess, MD as Attending Physician (Cardiology) Janalyn Harder, MD as Consulting Physician (Dermatology)  Indicate any recent Medical Services you may have received from other than [facility] providers in the past year (date may be approximate).     Assessment:   This is a routine wellness examination for Shahzad.  Hearing/Vision screen Vision Screening - Comments:: Regular eye exams, Dr. Fredrich Birks  Dietary issues and exercise activities discussed: Current Exercise Habits: Home exercise routine, Type of exercise: calisthenics;strength training/weights, Time (Minutes): 20, Frequency (Times/Week): 5, Weekly Exercise (Minutes/Week): 100   Goals Addressed             This Visit's Progress    Patient Stated       07/01/2022, wants to keep weight below 200 pounds, stay active and physically fit       Depression Screen    07/01/2022    9:59 AM 10/08/2021    2:32 PM 06/24/2021    9:47 AM 03/19/2021   11:09 AM 03/09/2021    9:26 AM 06/23/2020   10:44 AM 05/15/2019   10:21 AM  PHQ 2/9 Scores  PHQ - 2 Score 0 0 0 0 0 0 0  PHQ- 9 Score 0          Fall Risk    07/01/2022    9:59 AM 10/08/2021    2:31 PM 06/24/2021    9:47 AM  06/04/2021   10:48 AM 03/19/2021   11:09 AM  Fall Risk   Falls in the past year? 0 0 0 0 0  Number falls in past yr: 0 0 0    Injury with Fall? 0 0 0    Risk for fall due to : Medication side effect No Fall Risks No Fall Risks    Follow up Falls evaluation completed;Education provided;Falls prevention discussed Falls evaluation completed Falls evaluation completed      FALL RISK PREVENTION PERTAINING TO THE HOME:  Any stairs in or around the home? Yes  If so, are there any without handrails? No  Home free of loose throw rugs in walkways, pet beds, electrical cords, etc? Yes  Adequate lighting in your home to reduce risk of falls? Yes   ASSISTIVE DEVICES UTILIZED TO PREVENT FALLS:  Life alert? No  Use of a cane, walker or w/c? No  Grab bars in the bathroom? No  Shower chair or bench in shower? No  Elevated toilet seat or a handicapped toilet? Yes   TIMED UP AND GO:  Was the test performed? No .      Cognitive Function:        07/01/2022   10:01 AM  6CIT Screen  What Year? 0 points  What month? 0 points  What time? 0 points  Count back from 20 0 points  Months in reverse 0 points  Repeat phrase 0 points  Total Score 0 points    Immunizations Immunization History  Administered Date(s) Administered   Hepatitis B 05/17/1992, 12/17/1992, 06/16/2002   HiB (PRP-OMP) 09/17/2018   Influenza Split 09/08/2014, 08/29/2015   Influenza, High Dose Seasonal PF 09/01/2015, 09/13/2016, 09/13/2017, 09/17/2018, 08/28/2021   Influenza,inj,quad, With Preservative 08/13/2019   Influenza-Unspecified 09/02/2017, 09/17/2018   MMR 04/16/2012   PFIZER(Purple Top)SARS-COV-2 Vaccination 12/24/2019, 01/14/2020, 08/23/2020, 09/04/2020   PNEUMOCOCCAL CONJUGATE-20 05/13/2022   PPD Test 04/02/2012, 04/16/2012, 06/04/2013, 06/19/2013   Pneumococcal Conjugate-13 11/19/2015, 08/19/2017   Pneumococcal Polysaccharide-23 03/14/2012, 09/13/2016, 08/25/2017, 08/15/2018, 09/24/2018, 09/23/2019,  09/04/2020, 09/03/2021   Pneumococcal-Unspecified 12/05/2001, 09/14/2018, 07/01/2019   Td 02/03/2003, 02/08/2021   Tdap 12/02/2009   Zoster Recombinat (Shingrix) 09/09/2017, 10/05/2017   Zoster, Live 09/16/2008    TDAP status: Up to date  Flu Vaccine status: Due, Education has been provided regarding the importance of this vaccine. Advised may receive this vaccine at local pharmacy or Health Dept. Aware to provide a copy of the vaccination record if obtained from local pharmacy or Health Dept. Verbalized acceptance and understanding.  Pneumococcal vaccine status: Up to date  Covid-19 vaccine status: Completed vaccines  Qualifies for Shingles Vaccine? Yes   Zostavax completed Yes   Shingrix Completed?: Yes  Screening Tests Health Maintenance  Topic Date Due   COVID-19 Vaccine (5 - Booster for Pfizer series) 10/30/2020   INFLUENZA VACCINE  06/28/2022   COLONOSCOPY (Pts 45-55yrs Insurance coverage will need to be confirmed)  11/12/2030   TETANUS/TDAP  02/09/2031   Pneumonia Vaccine 65+ Years old  Completed   Hepatitis C Screening  Completed   Zoster Vaccines- Shingrix  Completed   HPV VACCINES  Aged Out    Health Maintenance  Health Maintenance Due  Topic Date Due   COVID-19 Vaccine (5 - Booster for [place] series) 10/30/2020   INFLUENZA VACCINE  06/28/2022    Colorectal cancer screening: No longer required.   Lung Cancer Screening: (Low Dose CT Chest recommended if Age 23-80 years, 30 pack-year currently smoking OR have quit w/in 15years.) does not qualify.   Lung Cancer Screening Referral: no  Additional Screening:  Hepatitis C Screening: does qualify; Completed 03/10/2017  Vision Screening: Recommended annual ophthalmology exams for early detection of glaucoma and other disorders of the eye. Is the patient up to date with their annual eye exam?  Yes  Who is the provider or what is the name of the office in which the patient attends annual eye exams? Dr. Macarthur Critchley If pt is not established with a provider, would they like to be referred to a provider to establish care? No .   Dental Screening: Recommended annual dental exams for proper oral hygiene  Community Resource Referral / Chronic Care Management: CRR required this visit?  No   CCM required this visit?  No      Plan:     I have personally reviewed and noted the following in the patient's chart:   Medical and social history Use of alcohol, tobacco or illicit drugs  Current medications and supplements including opioid prescriptions. Patient is not currently taking opioid prescriptions. Functional ability and status Nutritional status Physical activity Advanced directives List of other physicians Hospitalizations, surgeries, and ER visits in previous 12 months Vitals Screenings to include cognitive, depression, and falls Referrals and appointments  In addition, I have reviewed and discussed with patient certain preventive protocols, quality metrics, and best practice recommendations. A written personalized care plan for preventive services as well as general preventive health recommendations were provided to patient.     Kellie Simmering, LPN   9/0/2409   Nurse Notes: none  Due to this being a virtual visit, the after visit summary with patients personalized plan was offered to patient via mail or my-chart. Patient would like to access on my-chart

## 2022-07-01 NOTE — Patient Instructions (Signed)
Daniel [name] , Thank you for taking time to come for your Medicare Wellness Visit. I appreciate your ongoing commitment to your health goals. Please review the following plan we discussed and let me know if I can assist you in the future.   Screening recommendations/referrals: Colonoscopy: not required Recommended yearly ophthalmology/optometry visit for glaucoma screening and checkup Recommended yearly dental visit for hygiene and checkup  Vaccinations: Influenza vaccine: due Pneumococcal vaccine: completed 05/13/2022 Tdap vaccine: completed 02/08/2021, due 02/09/2031 Shingles vaccine: completed   Covid-19:  09/04/2020, 08/23/2020, 01/14/2020, 12/24/2019  Advanced directives: Advance directive discussed with you today.   Conditions/risks identified: none  Next appointment: Follow up in one year for your annual wellness visit.   Preventive Care 39 Years and Older, Male Preventive care refers to lifestyle choices and visits with your health care provider that can promote health and wellness. What does preventive care include? A yearly physical exam. This is also called an annual well check. Dental exams once or twice a year. Routine eye exams. Ask your health care provider how often you should have your eyes checked. Personal lifestyle choices, including: Daily care of your teeth and gums. Regular physical activity. Eating a healthy diet. Avoiding tobacco and drug use. Limiting alcohol use. Practicing safe sex. Taking low doses of aspirin every day. Taking vitamin and mineral supplements as recommended by your health care provider. What happens during an annual well check? The services and screenings done by your health care provider during your annual well check will depend on your age, overall health, lifestyle risk factors, and family history of disease. Counseling  Your health care provider may ask you questions about your: Alcohol use. Tobacco use. Drug use. Emotional  well-being. Home and relationship well-being. Sexual activity. Eating habits. History of falls. Memory and ability to understand (cognition). Work and work Statistician. Screening  You may have the following tests or measurements: Height, weight, and BMI. Blood pressure. Lipid and cholesterol levels. These may be checked every 5 years, or more frequently if you are over 8 years old. Skin check. Lung cancer screening. You may have this screening every year starting at age 55 if you have a 30-pack-year history of smoking and currently smoke or have quit within the past 15 years. Fecal occult blood test (FOBT) of the stool. You may have this test every year starting at age 45. Flexible sigmoidoscopy or colonoscopy. You may have a sigmoidoscopy every 5 years or a colonoscopy every 10 years starting at age 52. Prostate cancer screening. Recommendations will vary depending on your family history and other risks. Hepatitis C blood test. Hepatitis B blood test. Sexually transmitted disease (STD) testing. Diabetes screening. This is done by checking your blood sugar (glucose) after you have not eaten for a while (fasting). You may have this done every 1-3 years. Abdominal aortic aneurysm (AAA) screening. You may need this if you are a current or former smoker. Osteoporosis. You may be screened starting at age 77 if you are at high risk. Talk with your health care provider about your test results, treatment options, and if necessary, the need for more tests. Vaccines  Your health care provider may recommend certain vaccines, such as: Influenza vaccine. This is recommended every year. Tetanus, diphtheria, and acellular pertussis (Tdap, Td) vaccine. You may need a Td booster every 10 years. Zoster vaccine. You may need this after age 59. Pneumococcal 13-valent conjugate (PCV13) vaccine. One dose is recommended after age 52. Pneumococcal polysaccharide (PPSV23) vaccine. One dose is recommended  after  age 48. Talk to your health care provider about which screenings and vaccines you need and how often you need them. This information is not intended to replace advice given to you by your health care provider. Make sure you discuss any questions you have with your health care provider. Document Released: 12/11/2015 Document Revised: 08/03/2016 Document Reviewed: 09/15/2015 Elsevier Interactive Patient Education  2017 [employer] Prevention in the Home Falls can cause injuries. They can happen to people of all ages. There are many things you can do to make your home safe and to help prevent falls. What can I do on the outside of my home? Regularly fix the edges of walkways and driveways and fix any cracks. Remove anything that might make you trip as you walk through a door, such as a raised step or threshold. Trim any bushes or trees on the path to your home. Use bright outdoor lighting. Clear any walking paths of anything that might make someone trip, such as rocks or tools. Regularly check to see if handrails are loose or broken. Make sure that both sides of any steps have handrails. Any raised decks and porches should have guardrails on the edges. Have any leaves, snow, or ice cleared regularly. Use sand or salt on walking paths during winter. Clean up any spills in your garage right away. This includes oil or grease spills. What can I do in the bathroom? Use night lights. Install grab bars by the toilet and in the tub and shower. Do not use towel bars as grab bars. Use non-skid mats or decals in the tub or shower. If you need to sit down in the shower, use a plastic, non-slip stool. Keep the floor dry. Clean up any water that spills on the floor as soon as it happens. Remove soap buildup in the tub or shower regularly. Attach bath mats securely with double-sided non-slip rug tape. Do not have throw rugs and other things on the floor that can make you trip. What can I do in the  bedroom? Use night lights. Make sure that you have a light by your bed that is easy to reach. Do not use any sheets or blankets that are too big for your bed. They should not hang down onto the floor. Have a firm chair that has side arms. You can use this for support while you get dressed. Do not have throw rugs and other things on the floor that can make you trip. What can I do in the kitchen? Clean up any spills right away. Avoid walking on wet floors. Keep items that you use a lot in easy-to-reach places. If you need to reach something above you, use a strong step stool that has a grab bar. Keep electrical cords out of the way. Do not use floor polish or wax that makes floors slippery. If you must use wax, use non-skid floor wax. Do not have throw rugs and other things on the floor that can make you trip. What can I do with my stairs? Do not leave any items on the stairs. Make sure that there are handrails on both sides of the stairs and use them. Fix handrails that are broken or loose. Make sure that handrails are as long as the stairways. Check any carpeting to make sure that it is firmly attached to the stairs. Fix any carpet that is loose or worn. Avoid having throw rugs at the top or bottom of the stairs. If you  do have throw rugs, attach them to the floor with carpet tape. Make sure that you have a light switch at the top of the stairs and the bottom of the stairs. If you do not have them, ask someone to add them for you. What else can I do to help prevent falls? Wear shoes that: Do not have high heels. Have rubber bottoms. Are comfortable and fit you well. Are closed at the toe. Do not wear sandals. If you use a stepladder: Make sure that it is fully opened. Do not climb a closed stepladder. Make sure that both sides of the stepladder are locked into place. Ask someone to hold it for you, if possible. Clearly mark and make sure that you can see: Any grab bars or  handrails. First and last steps. Where the edge of each step is. Use tools that help you move around (mobility aids) if they are needed. These include: Canes. Walkers. Scooters. Crutches. Turn on the lights when you go into a dark area. Replace any light bulbs as soon as they burn out. Set up your furniture so you have a clear path. Avoid moving your furniture around. If any of your floors are uneven, fix them. If there are any pets around you, be aware of where they are. Review your medicines with your doctor. Some medicines can make you feel dizzy. This can increase your chance of falling. Ask your doctor what other things that you can do to help prevent falls. This information is not intended to replace advice given to you by your health care provider. Make sure you discuss any questions you have with your health care provider. Document Released: 09/10/2009 Document Revised: 04/21/2016 Document Reviewed: 12/19/2014 Elsevier Interactive Patient Education  2017 [employer].

## 2022-07-04 DIAGNOSIS — J3089 Other allergic rhinitis: Secondary | ICD-10-CM | POA: Diagnosis not present

## 2022-07-06 ENCOUNTER — Encounter: Payer: Self-pay | Admitting: Dermatology

## 2022-07-06 NOTE — Progress Notes (Signed)
   Follow-Up Visit   Subjective  Daniel [name] is a 76 y.o. male who presents for the following: Annual Exam (Left bridge of nose patches of dry skin, no bleeding).  Annual skin examination, crusty spot on nose Location:  Duration:  Quality:  Associated Signs/Symptoms: Modifying Factors:  Severity:  Timing: Context:   Objective  Well appearing patient in no apparent distress; mood and affect are within normal limits. Waist up skin examination: No atypical pigmented lesions (all checked with dermoscopy, no nonmelanoma skin cancer  Dorsum of Nose (2) Gritty pink 5 mm scale  Chest (Upper Torso, Anterior) Several 1 mm smooth red dermal papules  Chest (Upper Torso, Anterior) Several 4 to 8 mm textured brown flattopped papules, compatible dermoscopy    All skin waist up examined.   Assessment & Plan    AK (actinic keratosis) (2) Dorsum of Nose  If this does not clear with freezing he may return for biopsy  Destruction of lesion - Dorsum of Nose Complexity: simple   Destruction method: cryotherapy   Informed consent: discussed and consent obtained   Timeout:  patient name, date of birth, surgical site, and procedure verified Lesion destroyed using liquid nitrogen: Yes   Cryotherapy cycles:  5 Outcome: patient tolerated procedure well with no complications    Hemangioma of skin Chest (Upper Torso, Anterior)  No intervention necessary  Seborrheic keratosis Chest (Upper Torso, Anterior)  Leave if stable  Encounter for screening for malignant neoplasm of skin  Annual skin examination      I, Lavonna Monarch, MD, have reviewed all documentation for this visit.  The documentation on 07/06/22 for the exam, diagnosis, procedures, and orders are all accurate and complete.

## 2022-07-11 DIAGNOSIS — J301 Allergic rhinitis due to pollen: Secondary | ICD-10-CM | POA: Diagnosis not present

## 2022-07-11 DIAGNOSIS — J3089 Other allergic rhinitis: Secondary | ICD-10-CM | POA: Diagnosis not present

## 2022-07-11 DIAGNOSIS — J3081 Allergic rhinitis due to animal (cat) (dog) hair and dander: Secondary | ICD-10-CM | POA: Diagnosis not present

## 2022-07-14 DIAGNOSIS — J343 Hypertrophy of nasal turbinates: Secondary | ICD-10-CM | POA: Diagnosis not present

## 2022-07-14 DIAGNOSIS — J320 Chronic maxillary sinusitis: Secondary | ICD-10-CM | POA: Diagnosis not present

## 2022-07-26 DIAGNOSIS — J301 Allergic rhinitis due to pollen: Secondary | ICD-10-CM | POA: Diagnosis not present

## 2022-07-26 DIAGNOSIS — J3081 Allergic rhinitis due to animal (cat) (dog) hair and dander: Secondary | ICD-10-CM | POA: Diagnosis not present

## 2022-07-26 DIAGNOSIS — J3089 Other allergic rhinitis: Secondary | ICD-10-CM | POA: Diagnosis not present

## 2022-07-31 ENCOUNTER — Other Ambulatory Visit: Payer: Self-pay | Admitting: Medical

## 2022-08-03 ENCOUNTER — Encounter: Payer: Self-pay | Admitting: Internal Medicine

## 2022-08-04 ENCOUNTER — Ambulatory Visit (INDEPENDENT_AMBULATORY_CARE_PROVIDER_SITE_OTHER): Payer: Medicare PPO | Admitting: Medical

## 2022-08-04 VITALS — BP 120/70 | HR 59 | Ht 71.0 in | Wt 201.8 lb

## 2022-08-04 DIAGNOSIS — N400 Enlarged prostate without lower urinary tract symptoms: Secondary | ICD-10-CM | POA: Diagnosis not present

## 2022-08-04 DIAGNOSIS — Z0000 Encounter for general adult medical examination without abnormal findings: Secondary | ICD-10-CM

## 2022-08-04 DIAGNOSIS — I350 Nonrheumatic aortic (valve) stenosis: Secondary | ICD-10-CM

## 2022-08-04 DIAGNOSIS — Z7189 Other specified counseling: Secondary | ICD-10-CM

## 2022-08-04 DIAGNOSIS — N1831 Chronic kidney disease, stage 3a: Secondary | ICD-10-CM | POA: Diagnosis not present

## 2022-08-04 DIAGNOSIS — I10 Essential (primary) hypertension: Secondary | ICD-10-CM | POA: Diagnosis not present

## 2022-08-04 DIAGNOSIS — I6523 Occlusion and stenosis of bilateral carotid arteries: Secondary | ICD-10-CM

## 2022-08-04 DIAGNOSIS — K760 Fatty (change of) liver, not elsewhere classified: Secondary | ICD-10-CM

## 2022-08-04 DIAGNOSIS — M5030 Other cervical disc degeneration, unspecified cervical region: Secondary | ICD-10-CM

## 2022-08-04 DIAGNOSIS — E785 Hyperlipidemia, unspecified: Secondary | ICD-10-CM

## 2022-08-04 DIAGNOSIS — E042 Nontoxic multinodular goiter: Secondary | ICD-10-CM

## 2022-08-04 DIAGNOSIS — R7301 Impaired fasting glucose: Secondary | ICD-10-CM | POA: Diagnosis not present

## 2022-08-04 DIAGNOSIS — Z7185 Encounter for immunization safety counseling: Secondary | ICD-10-CM | POA: Diagnosis not present

## 2022-08-04 DIAGNOSIS — M48061 Spinal stenosis, lumbar region without neurogenic claudication: Secondary | ICD-10-CM

## 2022-08-04 LAB — POCT URINALYSIS DIP (PROADVANTAGE DEVICE)
Bilirubin, UA: NEGATIVE
Blood, UA: NEGATIVE
Glucose, UA: NEGATIVE mg/dL
Ketones, POC UA: NEGATIVE mg/dL
Leukocytes, UA: NEGATIVE
Nitrite, UA: NEGATIVE
Protein Ur, POC: NEGATIVE mg/dL
Specific Gravity, Urine: 1.005
Urobilinogen, Ur: NEGATIVE
pH, UA: 7.5 (ref 5.0–8.0)

## 2022-08-04 NOTE — Progress Notes (Signed)
Subjective:    Daniel [name] is a 76 y.o. male who presents for Preventative Services visit and chronic medical problems/med check visit.    Chief Complaint  Patient presents with   fasting awv    Fasting awv and declines flu shot     Primary Care Provider Tysinger, Camelia Eng, PA-C here for primary care   Current Health Care Team: Dr. Susa Day, orthopedics Dr. Quay Burow, cardiology Dr. Lavonna Monarch, dermatology Dr. [name], GI prior, Dr. Scarlette Shorts, GI 2021 Dentist, Dr. Dr. Oren Binet in [place], Dr. Luretha Rued Urology    Medical Services you may have received from other than [facility] providers in the past year (date may be approximate) none  Exercise Current exercise habits:  every day    Nutrition/Diet Current diet: in general, a "healthy" diet    Depression Screen    07/01/2022    9:59 AM  Depression screen PHQ 2/9  Decreased Interest 0  Down, Depressed, Hopeless 0  PHQ - 2 Score 0  Altered sleeping 0  Tired, decreased energy 0  Change in appetite 0  Feeling bad or failure about yourself  0  Trouble concentrating 0  Moving slowly or fidgety/restless 0  Suicidal thoughts 0  PHQ-9 Score 0  Difficult doing work/chores Not difficult at all    Activities of Daily Living Screen/Functional Status Survey    Can patient draw a clock face showing 3:15 oclock, did AWV 07/01/22  Fall Risk Screen    08/04/2022   10:39 AM 07/01/2022    9:59 AM 10/08/2021    2:31 PM 06/24/2021    9:47 AM 06/04/2021   10:48 AM  [place] in the past year? 0 0 0 0 0  Number falls in past yr: 0 0 0 0   Injury with Fall? 0 0 0 0   Risk for fall due to : No Fall Risks Medication side effect No Fall Risks No Fall Risks   Follow up Falls evaluation completed Falls evaluation completed;Education provided;Falls prevention discussed Falls evaluation completed Falls evaluation completed     Gait Assessment: Normal gait observed yes  Advanced directives Does  patient have a [place]? No Does patient have a Living Will? No  Past Medical History:  Diagnosis Date   Abdominal pain    with coffee   Abdominal ultrasound, abnormal 12/09/09   fatty liver disease   Abnormal carotid ultrasound 03/01/07   R no ICA stenosis. 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 Mosetta Anis;   Arthritis    osteoarthritis back/spine   Basal cell carcinoma 06/15/2020   Left Breast (nodular)   Diverticulosis    Dyslipidemia    Dysplastic nevus 03/01/1993   slight, left back and mid lower back, no treatment   Dysplastic nevus 03/04/2004   moderate-marked, low mid back, excision   Dysplastic nevus 06/14/2010   mild-mod, left hip, wider shave   Fatty liver    H/O CT scan of abdomen 03/20/12   abdomen/pelvis - normal   H/O CT scan of chest 03/20/12   normal   H/O diagnostic ultrasound 03/06/11   Aortic ultrasound - no AAA identified   H/O echocardiogram 10/18/2011   transthoracic - mild concentric LVH, EF 55%, mild valvular aortic stenosis, LA mild to moderate dilation, otherwise normal echo; Dr. Rollene Fare   Heart murmur, aortic    cardiac eval 09/2011, Dr. Gwenlyn Found (  [facility]), mild aortic stenosis   Hyperlipidemia    Hypertension    Inguinal hernia 2012   right   Normal cardiac stress test 03/02/07   Myoview, EF 65%; Dr. 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 Alinda Money    Past Surgical History:  Procedure Laterality Date   COLONOSCOPY  10/21/2008   internal hemorrhoids, diverticulosis; repeat 10/2018; Dr. Scarlette Shorts, [place]   HAND SURGERY  2004   left ganglion cyst removed    NOSE SURGERY  2005   obstruction, polyp and deviated septum; Dr. Omar Person SURGERY  2002, 2008   bilat, RTC    Social History   Socioeconomic History    Marital status: Married    Spouse name: Not on file   Number of children: Not on file   Years of education: Not on file   Highest education level: Not on file  Occupational History   Occupation: Presenter, broadcasting; part time [employer]    Employer: [employer]  Tobacco Use   Smoking status: Never   Smokeless tobacco: Never  Vaping Use   Vaping Use: Never used  Substance and Sexual Activity   Alcohol use: Yes    Alcohol/week: 2.0 standard drinks of alcohol    Types: 2 Cans of beer per week    Comment: non alcoholic beer   Drug use: No   Sexual activity: Yes  Other Topics Concern   Not on file  Social History Narrative   Married, no children, 2 dogs and a cat, exercise - aerobics, some weight bearing   Social Determinants of Health   Financial Resource Strain: Low Risk  (07/01/2022)   Overall Financial Resource Strain (CARDIA)    Difficulty of Paying Living Expenses: Not hard at all  Food Insecurity: No Food Insecurity (07/01/2022)   Hunger Vital Sign    Worried About Running Out of Food in the Last Year: Never true    Ran Out of Food in the Last Year: Never true  Transportation Needs: No Transportation Needs (07/01/2022)   PRAPARE - Hydrologist (Medical): No    Lack of Transportation (Non-Medical): No  Physical Activity: Insufficiently Active (07/01/2022)   Exercise Vital Sign    Days of Exercise per Week: 5 days    Minutes of Exercise per Session: 20 min  Stress: No Stress Concern Present (07/01/2022)   [place]    Feeling of Stress : Not at all  Social Connections: Not on file  Intimate Partner Violence: Not on file    Family History  Problem Relation Age of Onset   Cancer Mother        breast   Dementia Mother 53   Kidney disease Father 60       kidney failure   Rheum arthritis Father    Diabetes Neg Hx    Heart disease Neg Hx    Stroke Neg Hx    Hypertension Neg  Hx    Hyperlipidemia Neg Hx    Colon cancer Neg Hx    Colon polyps Neg Hx    Esophageal cancer Neg Hx    Rectal cancer Neg Hx    Stomach cancer Neg Hx      Current Outpatient Medications:    co-enzyme Q-10 30  MG capsule, Take 30 mg by mouth 3 (three) times daily., Disp: , Rfl:    EPINEPHrine 0.3 mg/0.3 mL IJ SOAJ injection, , Disp: , Rfl:    ezetimibe (ZETIA) 10 MG tablet, TAKE 1 TABLET(10 MG) BY MOUTH DAILY, Disp: 90 tablet, Rfl: 0   metoprolol succinate (TOPROL-XL) 50 MG 24 hr tablet, TAKE 1 TABLET BY MOUTH EVERY EVENING AND 1/2 TABLET(25 MG TOTAL) EVERY MORNING. TAKE WITH OR IMMEDIATELY FOLLOWING A MEAL (Patient taking differently: Take 50 mg by mouth daily.), Disp: 135 tablet, Rfl: 3   Multiple Vitamin (MULTIVITAMIN) tablet, Take 1 tablet by mouth daily., Disp: , Rfl:    pregabalin (LYRICA) 75 MG capsule, Take 75 mg by mouth daily., Disp: , Rfl:    rosuvastatin (CRESTOR) 10 MG tablet, TAKE 1 TABLET BY MOUTH EVERY OTHER DAY, Disp: 90 tablet, Rfl: 0   vitamin B-12 (CYANOCOBALAMIN) 100 MCG tablet, Take 100 mcg by mouth daily., Disp: , Rfl:   Allergies  Allergen Reactions   Codeine Nausea Only and Other (See Comments)    Dizziness, sweat and will make patient pass out.   Lipitor [Atorvastatin]     Elevated LFTs, no symptoms   Sulfa Antibiotics Rash    History reviewed: allergies, current medications, past family history, past medical history, past social history, past surgical history and problem list  Chronic issues discussed: Dyslipidemia-compliant with Zetia and statin  Hypertension-compliant with Toprol-XL  Chronic pain, history of radicular pain-he only takes Lyrica periodically   Acute issues discussed: Hx/o remote shoulder issues, rotator cuff issues, tears, prior surgery.   Has had ongoing issues with some shoulder pains intermittent.  Lately has had some discomfort.  Does exercise regularly and doing weights.   Gets pain with shoulder flexion over 90 degrees.      Thinks he has a lipoma on back of neck he has noticed this year.  Has ongoing dental issues.  Seeing dentist  Objective:    Biometrics BP 120/70   Pulse (!) 59   Ht _0  (1.803 m)   Wt 201 lb 12.8 oz (91.5 kg)   BMI 28.15 kg/m   Wt Readings from Last 3 Encounters:  08/04/22 201 lb 12.8 oz (91.5 kg)  07/01/22 200 lb (90.7 kg)  01/03/22 204 lb 6.4 oz (92.7 kg)    Gen: wd, wn, nad Skin: posterior left neck with small superficial lump approx 1 cm diameter or less, possible lipoma vs cyst, otherwise skin unremarkable HEENT: normocephalic, sclerae anicteric, TMs pearly, nares patent, no discharge or erythema, pharynx normal Oral cavity: MMM, no lesions, teeth in good repair Neck: supple, no lymphadenopathy, no thyromegaly, no masses, no bruits Heart: 2/6 brief holosystolic murmur upper left and right border, otherwise RRR, normal S1, S2 Lungs: CTA bilaterally, no wheezes, rhonchi, or rales Abdomen: +bs, soft, non tender, non distended, no masses, no hepatomegaly, no splenomegaly Musculoskeletal: +tender left deltoid superior latearlly, mild pain with abduction of shoulder but ROM full, otherwise arms and legs nontender, no swelling, no obvious deformity Extremities: no edema, no cyanosis, no clubbing Pulses: 2+ symmetric, upper and lower extremities, normal cap refill Neurological: alert, oriented x 3, CN2-12 intact, strength normal upper extremities and lower extremities, sensation normal throughout, DTRs 2+ throughout, no cerebellar signs, gait normal Psychiatric: normal affect, behavior normal, pleasant  GU/rectal - deferred    Assessment:   Encounter Diagnoses  Name Primary?   Encounter for health maintenance examination in adult Yes   Dyslipidemia    DDD (degenerative disc disease), cervical  Benign prostatic hyperplasia, unspecified whether lower urinary tract symptoms present    Impaired fasting blood sugar    Fatty liver    Essential hypertension, benign     Stage 3a chronic kidney disease (HCC)    Vaccine counseling    Multinodular thyroid    Atherosclerosis of both carotid arteries    Advanced directives, counseling/discussion    Nonrheumatic aortic valve stenosis    Spinal stenosis of lumbar region, unspecified whether neurogenic claudication present      Plan:    This visit was a preventative care visit, also known as wellness visit or routine physical.   Topics typically include healthy lifestyle, diet, exercise, preventative care, vaccinations, sick and well care, proper use of emergency dept and after hours care, as well as other concerns.     Recommendations: Continue to return yearly for your annual wellness and preventative care visits.  This gives [place] a chance to discuss healthy lifestyle, exercise, vaccinations, review your chart record, and perform screenings where appropriate.  I recommend you see your eye doctor yearly for routine vision care.  I recommend you see your dentist yearly for routine dental care including hygiene visits twice yearly.   Vaccination recommendations were reviewed Immunization History  Administered Date(s) Administered   HIB (PRP-OMP) 09/17/2018   Hepatitis B 05/17/1992, 12/17/1992, 06/16/2002   Influenza Split 09/08/2014, 08/29/2015   Influenza, High Dose Seasonal PF 09/01/2015, 09/13/2016, 09/13/2017, 09/17/2018, 08/28/2021   Influenza,inj,quad, With Preservative 08/13/2019   Influenza-Unspecified 09/02/2017, 09/17/2018   MMR 04/16/2012   PFIZER(Purple Top)SARS-COV-2 Vaccination 12/24/2019, 01/14/2020, 08/23/2020, 09/04/2020   PNEUMOCOCCAL CONJUGATE-20 05/13/2022   PPD Test 04/02/2012, 04/16/2012, 06/04/2013, 06/19/2013   Pneumococcal Conjugate-13 11/19/2015, 08/19/2017   Pneumococcal Polysaccharide-23 03/14/2012, 09/13/2016, 08/25/2017, 08/15/2018, 09/24/2018, 09/23/2019, 09/04/2020, 09/03/2021   Pneumococcal-Unspecified 12/05/2001, 09/14/2018, 07/01/2019   Td 02/03/2003, 02/08/2021    Tdap 12/02/2009   Zoster Recombinat (Shingrix) 09/09/2017, 10/05/2017   Zoster, Live 09/16/2008   Declines flu shot today, wants to get it at the pharmacy   Screening for cancer: Colon cancer screening: I reviewed your colonoscopy on file that is up to date from 2021  We discussed PSA, prostate exam, and prostate cancer screening risks/benefits.     Skin cancer screening: Check your skin regularly for new changes, growing lesions, or other lesions of concern Come in for evaluation if you have skin lesions of concern.  Lung cancer screening: If you have a greater than 20 pack year history of tobacco use, then you may qualify for lung cancer screening with a chest CT scan.   Please call your insurance company to inquire about coverage for this test.  We currently don't have screenings for other cancers besides breast, cervical, colon, and lung cancers.  If you have a strong family history of cancer or have other cancer screening concerns, please let me know.    Bone health: Get at least 150 minutes of aerobic exercise weekly Get weight bearing exercise at least once weekly Bone density test:  A bone density test is an imaging test that uses a type of X-ray to measure the amount of calcium and other minerals in your bones. The test may be used to diagnose or screen you for a condition that causes weak or thin bones (osteoporosis), predict your risk for a broken bone (fracture), or determine how well your osteoporosis treatment is working. The bone density test is recommended for females 66 and older, or females or males <44 if certain risk factors such as thyroid disease, long  term use of steroids such as for asthma or rheumatological issues, vitamin D deficiency, estrogen deficiency, family history of osteoporosis, self or family history of fragility fracture in first degree relative.  Bone density normal 11/2021   Heart health: Get at least 150 minutes of aerobic exercise  weekly Limit alcohol It is important to maintain a healthy blood pressure and healthy cholesterol numbers  Heart disease screening: Screening for heart disease includes screening for blood pressure, fasting lipids, glucose/diabetes screening, BMI height to weight ratio, reviewed of smoking status, physical activity, and diet.    Goals include blood pressure 120/80 or less, maintaining a healthy lipid/cholesterol profile, preventing diabetes or keeping diabetes numbers under good control, not smoking or using tobacco products, exercising most days per week or at least 150 minutes per week of exercise, and eating healthy variety of fruits and vegetables, healthy oils, and avoiding unhealthy food choices like fried food, fast food, high sugar and high cholesterol foods.    Continue routine follow up with your cardiologist    Medical care options: I recommend you continue to seek care here first for routine care.  We try really hard to have available appointments Monday through Friday daytime hours for sick visits, acute visits, and physicals.  Urgent care should be used for after hours and weekends for significant issues that cannot wait till the next day.  The emergency department should be used for significant potentially life-threatening emergencies.  The emergency department is expensive, can often have long wait times for less significant concerns, so try to utilize primary care, urgent care, or telemedicine when possible to avoid unnecessary trips to the emergency department.  Virtual visits and telemedicine have been introduced since the pandemic started in 2020, and can be convenient ways to receive medical care.  We offer virtual appointments as well to assist you in a variety of options to seek medical care.  Advanced Directives: I recommend you consider completing a [place] and Living Will.   These documents respect your wishes and help alleviate burdens on your loved  ones if you were to become terminally ill or be in a position to need those documents enforced.    You can complete Advanced Directives yourself, have them notarized, then have copies made for our office, for you and for anybody you feel should have them in safe keeping.  Or, you can have an attorney prepare these documents.   If you haven't updated your Last Will and Testament in a while, it may be worthwhile having an attorney prepare these documents together and save on some costs.       Separate significant issues discussed: Lump of back and neck -likely lipoma versus cyst, unchanged for the past year or so.  For now I would leave it alone if it is not bothering him or getting bigger  Dental pain-continue follow-up with dentist, consider taking Lyrica daily to see if this helps the neuropathic pain in general.  Consider brain MRI if pains continue in the face to rule out other issues  Shoulder pain-likely arthritis or some impingement -consider updated x-ray or referral to orthopedics.  He will consider and let me know  Hyperlipidemia-continue current meds patient, labs today  Hypertension-continue current medication     Quint was seen today for fasting awv.  Diagnoses and all orders for this visit:  Encounter for health maintenance examination in adult -     PSA, total and free -     Comprehensive  metabolic panel -     CBC with Differential/Platelet -     Lipid panel -     POCT Urinalysis DIP (Proadvantage Device) -     Hemoglobin A1c -     VITAMIN D 25 Hydroxy (Vit-D Deficiency, Fractures)  Dyslipidemia -     Lipid panel  DDD (degenerative disc disease), cervical  Benign prostatic hyperplasia, unspecified whether lower urinary tract symptoms present  Impaired fasting blood sugar -     Hemoglobin A1c  Fatty liver  Essential hypertension, benign  Stage 3a chronic kidney disease (HCC) -     Comprehensive metabolic panel -     VITAMIN D 25 Hydroxy (Vit-D  Deficiency, Fractures)  Vaccine counseling  Multinodular thyroid  Atherosclerosis of both carotid arteries  Advanced directives, counseling/discussion  Nonrheumatic aortic valve stenosis  Spinal stenosis of lumbar region, unspecified whether neurogenic claudication present     Medicare Attestation A preventative services visit was completed today.  During the course of the visit the patient was educated and counseled about appropriate screening and preventive services.  A health risk assessment was established with the patient that included a review of current medications, allergies, social history, family history, medical and preventative health history, biometrics, and preventative screenings to identify potential safety concerns or impairments.  A personalized plan was printed today for the patient's records and use.   Personalized health advice and education was given today to reduce health risks and promote self management and wellness.  Information regarding end of life planning was discussed today.  Dorothea Ogle, PA-C   08/04/2022

## 2022-08-05 ENCOUNTER — Other Ambulatory Visit: Payer: Self-pay | Admitting: Medical

## 2022-08-05 LAB — CBC WITH DIFFERENTIAL/PLATELET
Basophils Absolute: 0.1 10*3/uL (ref 0.0–0.2)
Basos: 1 %
EOS (ABSOLUTE): 0.1 10*3/uL (ref 0.0–0.4)
Eos: 2 %
Hematocrit: 50.7 % (ref 37.5–51.0)
Hemoglobin: 16.9 g/dL (ref 13.0–17.7)
Immature Grans (Abs): 0 10*3/uL (ref 0.0–0.1)
Immature Granulocytes: 0 %
Lymphocytes Absolute: 2.2 10*3/uL (ref 0.7–3.1)
Lymphs: 35 %
MCH: 31.6 pg (ref 26.6–33.0)
MCHC: 33.3 g/dL (ref 31.5–35.7)
MCV: 95 fL (ref 79–97)
Monocytes Absolute: 0.6 10*3/uL (ref 0.1–0.9)
Monocytes: 9 %
Neutrophils Absolute: 3.2 10*3/uL (ref 1.4–7.0)
Neutrophils: 53 %
Platelets: 145 10*3/uL — ABNORMAL LOW (ref 150–450)
RBC: 5.34 x10E6/uL (ref 4.14–5.80)
RDW: 13.1 % (ref 11.6–15.4)
WBC: 6.1 10*3/uL (ref 3.4–10.8)

## 2022-08-05 LAB — LIPID PANEL
Chol/HDL Ratio: 3.2 ratio (ref 0.0–5.0)
Cholesterol, Total: 144 mg/dL (ref 100–199)
HDL: 45 mg/dL (ref >39)
LDL Chol Calc (NIH): 80 mg/dL (ref 0–99)
Triglycerides: 100 mg/dL (ref 0–149)
VLDL Cholesterol Cal: 19 mg/dL (ref 5–40)

## 2022-08-05 LAB — COMPREHENSIVE METABOLIC PANEL
ALT: 34 IU/L (ref 0–44)
AST: 32 IU/L (ref 0–40)
Albumin/Globulin Ratio: 2 (ref 1.2–2.2)
Albumin: 4.8 g/dL (ref 3.8–4.8)
Alkaline Phosphatase: 55 IU/L (ref 44–121)
BUN/Creatinine Ratio: 12 (ref 10–24)
BUN: 16 mg/dL (ref 8–27)
Bilirubin Total: 0.5 mg/dL (ref 0.0–1.2)
CO2: 23 mmol/L (ref 20–29)
Calcium: 10 mg/dL (ref 8.6–10.2)
Chloride: 102 mmol/L (ref 96–106)
Creatinine, Ser: 1.36 mg/dL — ABNORMAL HIGH (ref 0.76–1.27)
Globulin, Total: 2.4 g/dL (ref 1.5–4.5)
Glucose: 98 mg/dL (ref 70–99)
Potassium: 4.5 mmol/L (ref 3.5–5.2)
Sodium: 141 mmol/L (ref 134–144)
Total Protein: 7.2 g/dL (ref 6.0–8.5)
eGFR: 55 mL/min/{1.73_m2} — ABNORMAL LOW (ref >59)

## 2022-08-05 LAB — PSA, TOTAL AND FREE
PSA, Free Pct: 30 %
PSA, Free: 0.99 ng/mL
Prostate Specific Ag, Serum: 3.3 ng/mL (ref 0.0–4.0)

## 2022-08-05 LAB — HEMOGLOBIN A1C
Est. average glucose Bld gHb Est-mCnc: 114 mg/dL
Hgb A1c MFr Bld: 5.6 % (ref 4.8–5.6)

## 2022-08-05 LAB — VITAMIN D 25 HYDROXY (VIT D DEFICIENCY, FRACTURES): Vit D, 25-Hydroxy: 48.9 ng/mL (ref 30.0–100.0)

## 2022-08-05 MED ORDER — EPINEPHRINE 0.3 MG/0.3ML IJ SOAJ: INTRAMUSCULAR | 1 refills | Status: AC

## 2022-08-05 MED ORDER — PREGABALIN 75 MG PO CAPS: 75.0000 mg | ORAL_CAPSULE | Freq: Every day | ORAL | 1 refills | Status: DC

## 2022-08-10 DIAGNOSIS — J3089 Other allergic rhinitis: Secondary | ICD-10-CM | POA: Diagnosis not present

## 2022-08-25 DIAGNOSIS — J3081 Allergic rhinitis due to animal (cat) (dog) hair and dander: Secondary | ICD-10-CM | POA: Diagnosis not present

## 2022-08-25 DIAGNOSIS — J301 Allergic rhinitis due to pollen: Secondary | ICD-10-CM | POA: Diagnosis not present

## 2022-08-25 DIAGNOSIS — J3089 Other allergic rhinitis: Secondary | ICD-10-CM | POA: Diagnosis not present

## 2022-08-31 ENCOUNTER — Ambulatory Visit (INDEPENDENT_AMBULATORY_CARE_PROVIDER_SITE_OTHER): Payer: Medicare PPO | Admitting: Plastic Surgery

## 2022-08-31 VITALS — BP 154/80 | HR 66 | Ht 71.0 in | Wt 203.0 lb

## 2022-08-31 DIAGNOSIS — D170 Benign lipomatous neoplasm of skin and subcutaneous tissue of head, face and neck: Secondary | ICD-10-CM | POA: Diagnosis not present

## 2022-09-01 NOTE — Progress Notes (Signed)
Referring Provider Tysinger, Camelia Eng, PA-C [address] [place],  [place] [address]   CC:  Left neck mass  Daniel [name] is an 77 y.o. male.  HPI: 76 year old with about a 1 year history of left neck mass.  This is grown slightly.  Patient is uncertain whether he would like to have this removed.    Allergies  Allergen Reactions   Codeine Nausea Only and Other (See Comments)    Dizziness, sweat and will make patient pass out.   Lipitor [Atorvastatin]     Elevated LFTs, no symptoms   Sulfa Antibiotics Rash    Outpatient Encounter Medications as of 08/31/2022  Medication Sig   co-enzyme Q-10 30 MG capsule Take 30 mg by mouth 3 (three) times daily.   EPINEPHrine 0.3 mg/0.3 mL IJ SOAJ injection Use as directed   ezetimibe (ZETIA) 10 MG tablet TAKE 1 TABLET(10 MG) BY MOUTH DAILY   metoprolol succinate (TOPROL-XL) 50 MG 24 hr tablet TAKE 1 TABLET BY MOUTH EVERY EVENING AND 1/2 TABLET(25 MG TOTAL) EVERY MORNING. TAKE WITH OR IMMEDIATELY FOLLOWING A MEAL (Patient taking differently: Take 50 mg by mouth daily.)   Multiple Vitamin (MULTIVITAMIN) tablet Take 1 tablet by mouth daily.   pregabalin (LYRICA) 75 MG capsule Take 1 capsule (75 mg total) by mouth daily.   rosuvastatin (CRESTOR) 10 MG tablet TAKE 1 TABLET BY MOUTH EVERY OTHER DAY   vitamin B-12 (CYANOCOBALAMIN) 100 MCG tablet Take 100 mcg by mouth daily.   No facility-administered encounter medications on file as of 08/31/2022.     Past Medical History:  Diagnosis Date   Abdominal pain    with coffee   Abdominal ultrasound, abnormal 12/09/09   fatty liver disease   Abnormal carotid ultrasound 03/01/07   R no ICA stenosis. 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 Mosetta Anis;   Arthritis    osteoarthritis back/spine   Basal cell carcinoma 06/15/2020   Left Breast (nodular)   Diverticulosis    Dyslipidemia    Dysplastic nevus 03/01/1993   slight, left back and  mid lower back, no treatment   Dysplastic nevus 03/04/2004   moderate-marked, low mid back, excision   Dysplastic nevus 06/14/2010   mild-mod, left hip, wider shave   Fatty liver    H/O CT scan of abdomen 03/20/12   abdomen/pelvis - normal   H/O CT scan of chest 03/20/12   normal   H/O diagnostic ultrasound 03/06/11   Aortic ultrasound - no AAA identified   H/O echocardiogram 10/18/2011   transthoracic - mild concentric LVH, EF 55%, mild valvular aortic stenosis, LA mild to moderate dilation, otherwise normal echo; Dr. Rollene Fare   Heart murmur, aortic    cardiac eval 09/2011, Dr. Gwenlyn Found [facility]), mild aortic stenosis   Hyperlipidemia    Hypertension    Inguinal hernia 2012   right   Normal cardiac stress test 03/02/07   Myoview, EF 65%; Dr. 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 Alinda Money    Past Surgical History:  Procedure Laterality Date   COLONOSCOPY  10/21/2008   internal hemorrhoids, diverticulosis; repeat 10/2018; Dr. Scarlette Shorts, [place]   HAND SURGERY  2004   left ganglion cyst removed    NOSE SURGERY  2005   obstruction, polyp  and deviated septum; Dr. Omar Person SURGERY  2002, 2008   bilat, RTC    Family History  Problem Relation Age of Onset   Cancer Mother        breast   Dementia Mother 94   Kidney disease Father 33       kidney failure   Rheum arthritis Father    Diabetes Neg Hx    Heart disease Neg Hx    Stroke Neg Hx    Hypertension Neg Hx    Hyperlipidemia Neg Hx    Colon cancer Neg Hx    Colon polyps Neg Hx    Esophageal cancer Neg Hx    Rectal cancer Neg Hx    Stomach cancer Neg Hx     Social History   Social History Narrative   Married, no children, 2 dogs and a cat, exercise - aerobics, some weight bearing     Review of Systems General: Denies fevers, chills, weight loss CV: Denies chest  pain, shortness of breath, palpitations   Physical Exam    08/31/2022    2:54 PM 08/04/2022   10:41 AM 07/01/2022    9:41 AM  Vitals with BMI  Height '5\' 11"'$  '5\' 11"'$  5' 11.5"  Weight 203 lbs 201 lbs 13 oz 200 lbs  BMI 28.33 48.25 00.37  Systolic 048 889 169  Diastolic 80 70 72  Pulse 66 59 69    General:  No acute distress,  Alert and oriented, Non-Toxic, Normal speech and affect HEENT: 2.5 x 2 cm very subtle fatty mass over the left posterior neck.  Assessment/Plan I discussed excision with the patient versus observation and given the small size of the lesion that is not growing quickly observation is option.  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  The patient wants to wait on any excision at this time.  If the mass starts growing significantly he will call us.  I offered to recheck in 6 to 12 months but he wants to just wait and call.    Lennice Sites 09/01/2022, 1:53 PM

## 2022-09-02 DIAGNOSIS — J3089 Other allergic rhinitis: Secondary | ICD-10-CM | POA: Diagnosis not present

## 2022-09-02 DIAGNOSIS — R052 Subacute cough: Secondary | ICD-10-CM | POA: Diagnosis not present

## 2022-09-02 DIAGNOSIS — H1045 Other chronic allergic conjunctivitis: Secondary | ICD-10-CM | POA: Diagnosis not present

## 2022-09-06 ENCOUNTER — Encounter: Payer: Self-pay | Admitting: Internal Medicine

## 2022-09-09 DIAGNOSIS — J3089 Other allergic rhinitis: Secondary | ICD-10-CM | POA: Diagnosis not present

## 2022-09-09 DIAGNOSIS — J301 Allergic rhinitis due to pollen: Secondary | ICD-10-CM | POA: Diagnosis not present

## 2022-09-09 DIAGNOSIS — J3081 Allergic rhinitis due to animal (cat) (dog) hair and dander: Secondary | ICD-10-CM | POA: Diagnosis not present

## 2022-09-11 ENCOUNTER — Other Ambulatory Visit: Payer: Self-pay | Admitting: Medical

## 2022-09-11 DIAGNOSIS — E785 Hyperlipidemia, unspecified: Secondary | ICD-10-CM

## 2022-09-15 ENCOUNTER — Ambulatory Visit (INDEPENDENT_AMBULATORY_CARE_PROVIDER_SITE_OTHER): Payer: Medicare PPO | Admitting: Medical

## 2022-09-15 VITALS — BP 110/70 | HR 74 | Wt 202.6 lb

## 2022-09-15 DIAGNOSIS — M62838 Other muscle spasm: Secondary | ICD-10-CM

## 2022-09-15 DIAGNOSIS — K5900 Constipation, unspecified: Secondary | ICD-10-CM | POA: Diagnosis not present

## 2022-09-15 DIAGNOSIS — L84 Corns and callosities: Secondary | ICD-10-CM | POA: Diagnosis not present

## 2022-09-15 NOTE — Progress Notes (Signed)
Subjective:  Daniel [name] is a 76 y.o. male who presents for Chief Complaint  Patient presents with   constipation    Constipation x several weeks. Has stage 3 kidney and he researched that having that can constipate you. Have tried things (coffee- limit dairy) hasn't taken OTC due to kidney disease. Taken fiber, fiber in drinks and nothing     Here for c/o constipation for several weeks.   Using some fiber, benafiber daily, some stool softeners.  Drinking water throughout the day.   Been eating some pears and some popcorn to help.  Getting rabbit pellet like poop.  Having BM typically daily but for several weeks having the pellets of poop, feels back up with poop.  Feeling bloated some.  No blood in stool. Using oatmeal, flax seed.  Has tried some chia seeds.  Drinking water and coffee.   Had recent busted blood vessel in eye that has resolved, no pain, no vision changes.  Has muscle spasm in back occasionally in right mid back over kidney.   Got splinter in right ring finger, thinks he got it out, but still has a callous.    No matter what, he is concerned about how any medicaiton affects his kidney.  No other aggravating or relieving factors.    No other c/o.  Past Medical History:  Diagnosis Date   Abdominal pain    with coffee   Abdominal ultrasound, abnormal 12/09/09   fatty liver disease   Abnormal carotid ultrasound 03/01/07   R no ICA stenosis. 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 Mosetta Anis;   Arthritis    osteoarthritis back/spine   Basal cell carcinoma 06/15/2020   Left Breast (nodular)   Diverticulosis    Dyslipidemia    Dysplastic nevus 03/01/1993   slight, left back and mid lower back, no treatment   Dysplastic nevus 03/04/2004   moderate-marked, low mid back, excision   Dysplastic nevus 06/14/2010   mild-mod, left hip, wider shave   Fatty liver    H/O CT scan of abdomen 03/20/12   abdomen/pelvis -  normal   H/O CT scan of chest 03/20/12   normal   H/O diagnostic ultrasound 03/06/11   Aortic ultrasound - no AAA identified   H/O echocardiogram 10/18/2011   transthoracic - mild concentric LVH, EF 55%, mild valvular aortic stenosis, LA mild to moderate dilation, otherwise normal echo; Dr. Rollene Fare   Heart murmur, aortic    cardiac eval 09/2011, Dr. Gwenlyn Found [facility]), mild aortic stenosis   Hyperlipidemia    Hypertension    Inguinal hernia 2012   right   Normal cardiac stress test 03/02/07   Myoview, EF 65%; Dr. 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 Alinda Money   Current Outpatient Medications on File Prior to Visit  Medication Sig Dispense Refill   co-enzyme Q-10 30 MG capsule Take 30 mg by mouth 3 (three) times daily.     EPINEPHrine 0.3 mg/0.3 mL IJ SOAJ injection Use as directed 1 each 1   ezetimibe (ZETIA) 10 MG tablet TAKE 1 TABLET(10 MG) BY MOUTH DAILY 90 tablet 0   metoprolol succinate (TOPROL-XL) 50 MG 24 hr tablet TAKE 1 TABLET BY MOUTH EVERY EVENING AND 1/2 TABLET(25 MG TOTAL) EVERY MORNING. TAKE WITH OR  IMMEDIATELY FOLLOWING A MEAL (Patient taking differently: Take 50 mg by mouth daily.) 135 tablet 3   Multiple Vitamin (MULTIVITAMIN) tablet Take 1 tablet by mouth daily.     pregabalin (LYRICA) 75 MG capsule Take 1 capsule (75 mg total) by mouth daily. 90 capsule 1   rosuvastatin (CRESTOR) 10 MG tablet TAKE 1 TABLET BY MOUTH EVERY OTHER DAY 90 tablet 0   vitamin B-12 (CYANOCOBALAMIN) 100 MCG tablet Take 100 mcg by mouth daily.     No current facility-administered medications on file prior to visit.     The following portions of the patient's history were reviewed and updated as appropriate: allergies, current medications, past family history, past medical history, past social history, past surgical history and problem  list.  ROS Otherwise as in subjective above   Objective: BP 110/70   Pulse 74   Wt 202 lb 9.6 oz (91.9 kg)   BMI 28.26 kg/m   General appearance: alert, no distress, well developed, well nourished Right right finger distal phalanx with round 35m thickened callous, central brown dot. Abdomen: +bs, soft, non tender, non distended, no masses, no hepatomegaly, no splenomegaly Pulses: 2+ radial pulses, 2+ pedal pulses, normal cap refill Ext: no edema   Assessment: Encounter Diagnoses  Name Primary?   Constipation, unspecified constipation type Yes   Callus    Muscle spasm      Plan: Constipation, recent short-term likely idiopathic.  Begin samples of MiraLAX once daily.  Hold off on stool softeners for now and trial of MiraLAX.  We discussed fiber and water intake.  If this works really well can use MiraLAX either daily or as needed.  If not improving over the next week then let me know.  I reviewed colonoscopy from 2021 with Dr. JScarlette Shorts Callus of skin-I took a sterile clean razor and shaved off the little callus on his right ring finger.  There is what appears to be a small cavity where the splinter was but no obvious splinter  Muscle spasm-continue stretching, heat, recheck if worsening   WMarkeithwas seen today for constipation.  Diagnoses and all orders for this visit:  Constipation, unspecified constipation type  Callus  Muscle spasm    Follow up: prn

## 2022-09-22 DIAGNOSIS — M795 Residual foreign body in soft tissue: Secondary | ICD-10-CM | POA: Diagnosis not present

## 2022-09-23 ENCOUNTER — Telehealth: Payer: Self-pay | Admitting: Internal Medicine

## 2022-09-23 NOTE — Telephone Encounter (Signed)
Discussed with pt that he can take miralax. He reports that he has stage 3 kidney disease and he looked up clear lax which is supposed to have less sodium so it is better for his kidneys. Discussed with pt that he can take the clear lax as needed and to keep his ov as scheduled.

## 2022-09-23 NOTE — Telephone Encounter (Signed)
Inbound call from patient wanting to speak with a nurse. Patient states he is experiencing constipation. Pease advise.,  Thank you

## 2022-09-26 DIAGNOSIS — J301 Allergic rhinitis due to pollen: Secondary | ICD-10-CM | POA: Diagnosis not present

## 2022-09-26 DIAGNOSIS — J3081 Allergic rhinitis due to animal (cat) (dog) hair and dander: Secondary | ICD-10-CM | POA: Diagnosis not present

## 2022-09-26 DIAGNOSIS — J3089 Other allergic rhinitis: Secondary | ICD-10-CM | POA: Diagnosis not present

## 2022-09-30 DIAGNOSIS — M795 Residual foreign body in soft tissue: Secondary | ICD-10-CM | POA: Diagnosis not present

## 2022-10-03 DIAGNOSIS — J3089 Other allergic rhinitis: Secondary | ICD-10-CM | POA: Diagnosis not present

## 2022-10-03 DIAGNOSIS — J3081 Allergic rhinitis due to animal (cat) (dog) hair and dander: Secondary | ICD-10-CM | POA: Diagnosis not present

## 2022-10-03 DIAGNOSIS — J301 Allergic rhinitis due to pollen: Secondary | ICD-10-CM | POA: Diagnosis not present

## 2022-10-12 ENCOUNTER — Institutional Professional Consult (permissible substitution): Payer: Medicare PPO | Admitting: Plastic Surgery

## 2022-10-17 DIAGNOSIS — J3089 Other allergic rhinitis: Secondary | ICD-10-CM | POA: Diagnosis not present

## 2022-10-17 DIAGNOSIS — J301 Allergic rhinitis due to pollen: Secondary | ICD-10-CM | POA: Diagnosis not present

## 2022-10-17 DIAGNOSIS — J3081 Allergic rhinitis due to animal (cat) (dog) hair and dander: Secondary | ICD-10-CM | POA: Diagnosis not present

## 2022-10-24 DIAGNOSIS — J3089 Other allergic rhinitis: Secondary | ICD-10-CM | POA: Diagnosis not present

## 2022-10-24 DIAGNOSIS — J301 Allergic rhinitis due to pollen: Secondary | ICD-10-CM | POA: Diagnosis not present

## 2022-10-24 DIAGNOSIS — J3081 Allergic rhinitis due to animal (cat) (dog) hair and dander: Secondary | ICD-10-CM | POA: Diagnosis not present

## 2022-10-31 DIAGNOSIS — J301 Allergic rhinitis due to pollen: Secondary | ICD-10-CM | POA: Diagnosis not present

## 2022-10-31 DIAGNOSIS — J3089 Other allergic rhinitis: Secondary | ICD-10-CM | POA: Diagnosis not present

## 2022-10-31 DIAGNOSIS — J3081 Allergic rhinitis due to animal (cat) (dog) hair and dander: Secondary | ICD-10-CM | POA: Diagnosis not present

## 2022-11-01 ENCOUNTER — Ambulatory Visit: Payer: Medicare PPO | Admitting: Physician Assistant

## 2022-11-03 ENCOUNTER — Other Ambulatory Visit: Payer: Self-pay | Admitting: Medical

## 2022-11-07 DIAGNOSIS — J301 Allergic rhinitis due to pollen: Secondary | ICD-10-CM | POA: Diagnosis not present

## 2022-11-07 DIAGNOSIS — J3089 Other allergic rhinitis: Secondary | ICD-10-CM | POA: Diagnosis not present

## 2022-11-07 DIAGNOSIS — J3081 Allergic rhinitis due to animal (cat) (dog) hair and dander: Secondary | ICD-10-CM | POA: Diagnosis not present

## 2022-11-16 ENCOUNTER — Encounter: Payer: Self-pay | Admitting: Cardiovascular Disease

## 2022-11-16 ENCOUNTER — Ambulatory Visit: Payer: Medicare PPO | Attending: Cardiovascular Disease | Admitting: Cardiovascular Disease

## 2022-11-16 VITALS — BP 132/82 | HR 62 | Ht 71.5 in | Wt 209.0 lb

## 2022-11-16 DIAGNOSIS — E785 Hyperlipidemia, unspecified: Secondary | ICD-10-CM

## 2022-11-16 DIAGNOSIS — I10 Essential (primary) hypertension: Secondary | ICD-10-CM

## 2022-11-16 DIAGNOSIS — I350 Nonrheumatic aortic (valve) stenosis: Secondary | ICD-10-CM

## 2022-11-16 NOTE — Assessment & Plan Note (Signed)
History of essential hypertension blood pressure measured today at 132/82.  He is on metoprolol.

## 2022-11-16 NOTE — Assessment & Plan Note (Signed)
History of dyslipidemia on Zetia and rosuvastatin with lipid profile performed 08/04/2022 revealing total cholesterol 144, LDL 80 and HDL 45.

## 2022-11-16 NOTE — Progress Notes (Unsigned)
11/16/2022 Daniel [name]   Apr 19, 1946  [id number]  Primary Physician Tysinger, Camelia Eng, PA-C Primary Cardiologist: [name] MD Renae Gloss  HPI:  Daniel [name] is a 76 y.o.  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  He was referred by Dr. Earlean Shawl for cardiovascular evaluation because of an auscultated murmur. He saw Dr. Golden Hurter remotely.  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 He has rare episodes of tachy palpitations. His cardiovascular risk factor profile is positive for hypertension and hyperlipidemia but otherwise is benign. There is no family history of heart disease. He has never had a heart attack or stroke and denies chest pain or shortness of breath. He had an echo done on 2008 that showed normal LV systolic function with an aortic valve area of 1.79 cm consistent with mild aortic stenosis.      He has lost 25 pounds as a result of diet and exercise.  He has since come off his antihypertensive medications and is normotensive at home when he checks.  He does do exercise on a daily basis.   His most recent 2D echocardiogram performed 02/04/2021 revealed normal LV size and function, grade 1 diastolic dysfunction and mild aortic stenosis with a valve area of 1.66 cm.  A peak gradient of 22 mmHg.  Since I saw him a year ago he continues to do well.  He still very active, works out on his land, does push-ups at night and is completely asymptomatic.  His most recent 2D echocardiogram performed 01/31/2022 revealed a slight decline in his aortic valve area from 1.66 cm to 1.55 cm.   Current Meds  Medication Sig   co-enzyme Q-10 30 MG capsule Take 30 mg by mouth 3 (three) times daily.   EPINEPHrine 0.3 mg/0.3 mL IJ SOAJ injection Use as directed   ezetimibe (ZETIA) 10 MG tablet TAKE 1 TABLET(10 MG) BY  MOUTH DAILY   metoprolol succinate (TOPROL-XL) 50 MG 24 hr tablet TAKE 1 TABLET BY MOUTH EVERY EVENING AND 1/2 TABLET(25 MG TOTAL) EVERY MORNING. TAKE WITH OR IMMEDIATELY FOLLOWING A MEAL (Patient taking differently: Take 50 mg by mouth daily.)   Multiple Vitamin (MULTIVITAMIN) tablet Take 1 tablet by mouth daily.   pregabalin (LYRICA) 75 MG capsule Take 1 capsule (75 mg total) by mouth daily.   rosuvastatin (CRESTOR) 10 MG tablet TAKE 1 TABLET BY MOUTH EVERY OTHER DAY   vitamin B-12 (CYANOCOBALAMIN) 100 MCG tablet Take 100 mcg by mouth daily.     Allergies  Allergen Reactions   Codeine Nausea Only and Other (See Comments)    Dizziness, sweat and will make patient pass out.   Lipitor [Atorvastatin]     Elevated LFTs, no symptoms   Sulfa Antibiotics Rash    Social History   Socioeconomic History   Marital status: Married    Spouse name: Not on file   Number of children: Not on file   Years of education: Not on file   Highest education level: Not on file  Occupational History   Occupation: Presenter, broadcasting; part time [employer]    Employer: [employer]  Tobacco Use   Smoking status: Never   Smokeless tobacco: Never  Vaping Use   Vaping Use: Never used  Substance and Sexual Activity   Alcohol use: Yes  Alcohol/week: 2.0 standard drinks of alcohol    Types: 2 Cans of beer per week    Comment: non alcoholic beer   Drug use: No   Sexual activity: Yes  Other Topics Concern   Not on file  Social History Narrative   Married, no children, 2 dogs and a cat, exercise - aerobics, some weight bearing   Social Determinants of Health   Financial Resource Strain: Low Risk  (07/01/2022)   Overall Financial Resource Strain (CARDIA)    Difficulty of Paying Living Expenses: Not hard at all  Food Insecurity: No Food Insecurity (07/01/2022)   Hunger Vital Sign    Worried About Running Out of Food in the Last Year: Never true    Ran Out of Food in the Last Year: Never true   Transportation Needs: No Transportation Needs (07/01/2022)   PRAPARE - Hydrologist (Medical): No    Lack of Transportation (Non-Medical): No  Physical Activity: Insufficiently Active (07/01/2022)   Exercise Vital Sign    Days of Exercise per Week: 5 days    Minutes of Exercise per Session: 20 min  Stress: No Stress Concern Present (07/01/2022)   [place]    Feeling of Stress : Not at all  Social Connections: Not on file  Intimate Partner Violence: Not on file     Review of Systems: General: negative for chills, fever, night sweats or weight changes.  Cardiovascular: negative for chest pain, dyspnea on exertion, edema, orthopnea, palpitations, paroxysmal nocturnal dyspnea or shortness of breath Dermatological: negative for rash Respiratory: negative for cough or wheezing Urologic: negative for hematuria Abdominal: negative for nausea, vomiting, diarrhea, bright red blood per rectum, melena, or hematemesis Neurologic: negative for visual changes, syncope, or dizziness All other systems reviewed and are otherwise negative except as noted above.    Blood pressure 132/82, pulse 62, height 5' 11.5" (1.816 m), weight 209 lb (94.8 kg).  General appearance: alert and no distress Neck: no adenopathy, no JVD, supple, symmetrical, trachea midline, thyroid not enlarged, symmetric, no tenderness/mass/nodules, and bilateral carotid bruits versus transmitted murmur Lungs: clear to auscultation bilaterally Heart: 2/6 outflow tract murmur at the base consistent with aortic stenosis Extremities: extremities normal, atraumatic, no cyanosis or edema Pulses: 2+ and symmetric Skin: Skin color, texture, turgor normal. No rashes or lesions Neurologic: Grossly normal  EKG sinus rhythm at 62 with LVH voltage.  I personally reviewed this EKG.  ASSESSMENT AND PLAN:   Essential hypertension, benign History of  essential hypertension blood pressure measured today at 132/82.  He is on metoprolol.  Dyslipidemia History of dyslipidemia on Zetia and rosuvastatin with lipid profile performed 08/04/2022 revealing total cholesterol 144, LDL 80 and HDL 45.  Aortic stenosis History of mild to moderate aortic stenosis with 2D echo performed 01/31/2022 revealing hyperdynamic LV function with an EF of 7075%, mild LVH, mild to moderate aortic stenosis with a valve area that has decreased from 1.66 cm last year to 1.55 cm with a peak gradient of 24 mmHg.  He is completely asymptomatic.  Will continue to follow him on an annual basis.     [name] MD FACP,FACC,FAHA, [facility] 11/16/2022 3:19 PM

## 2022-11-16 NOTE — Patient Instructions (Signed)
Medication Instructions:  Your physician recommends that you continue on your current medications as directed. Please refer to the Current Medication list given to you today.  *If you need a refill on your cardiac medications before your next appointment, please call your pharmacy*   Testing/Procedures: Your physician has requested that you have an echocardiogram. Echocardiography is a painless test that uses sound waves to create images of your heart. It provides your doctor with information about the size and shape of your heart and how well your heart's chambers and valves are working. This procedure takes approximately one hour. There are no restrictions for this procedure. Please do NOT wear cologne, perfume, aftershave, or lotions (deodorant is allowed). Please arrive 15 minutes prior to your appointment time. To be done in March 2024. This procedure will be done at [address]   Follow-Up: At [facility], you and your health needs are our priority.  As part of our continuing mission to provide you with exceptional heart care, we have created designated Provider Care Teams.  These Care Teams include your primary Cardiologist (physician) and Advanced Practice Providers (APPs -  Physician Assistants and Nurse Practitioners) who all work together to provide you with the care you need, when you need it.  We recommend signing up for the patient portal called "MyChart".  Sign up information is provided on this After Visit Summary.  MyChart is used to connect with patients for Virtual Visits (Telemedicine).  Patients are able to view lab/test results, encounter notes, upcoming appointments, etc.  Non-urgent messages can be sent to your provider as well.   To learn more about what you can do with MyChart, go to [email or web address].    Your next appointment:   12 month(s)  The format for your next appointment:   In Person  Provider:   Quay Burow, MD

## 2022-11-16 NOTE — Assessment & Plan Note (Signed)
History of mild to moderate aortic stenosis with 2D echo performed 01/31/2022 revealing hyperdynamic LV function with an EF of 7075%, mild LVH, mild to moderate aortic stenosis with a valve area that has decreased from 1.66 cm last year to 1.55 cm with a peak gradient of 24 mmHg.  He is completely asymptomatic.  Will continue to follow him on an annual basis.

## 2022-11-23 DIAGNOSIS — J301 Allergic rhinitis due to pollen: Secondary | ICD-10-CM | POA: Diagnosis not present

## 2022-11-23 DIAGNOSIS — J3089 Other allergic rhinitis: Secondary | ICD-10-CM | POA: Diagnosis not present

## 2022-12-08 DIAGNOSIS — J3089 Other allergic rhinitis: Secondary | ICD-10-CM | POA: Diagnosis not present

## 2022-12-08 DIAGNOSIS — J3081 Allergic rhinitis due to animal (cat) (dog) hair and dander: Secondary | ICD-10-CM | POA: Diagnosis not present

## 2022-12-08 DIAGNOSIS — J301 Allergic rhinitis due to pollen: Secondary | ICD-10-CM | POA: Diagnosis not present

## 2022-12-10 ENCOUNTER — Other Ambulatory Visit: Payer: Self-pay | Admitting: Medical

## 2022-12-10 DIAGNOSIS — E785 Hyperlipidemia, unspecified: Secondary | ICD-10-CM

## 2022-12-21 DIAGNOSIS — J3089 Other allergic rhinitis: Secondary | ICD-10-CM | POA: Diagnosis not present

## 2022-12-21 DIAGNOSIS — J3081 Allergic rhinitis due to animal (cat) (dog) hair and dander: Secondary | ICD-10-CM | POA: Diagnosis not present

## 2022-12-21 DIAGNOSIS — J301 Allergic rhinitis due to pollen: Secondary | ICD-10-CM | POA: Diagnosis not present

## 2023-01-03 ENCOUNTER — Ambulatory Visit (INDEPENDENT_AMBULATORY_CARE_PROVIDER_SITE_OTHER): Payer: Medicare PPO | Admitting: Medical

## 2023-01-03 VITALS — BP 132/90 | HR 64 | Temp 97.3°F | Wt 208.4 lb

## 2023-01-03 DIAGNOSIS — Z79899 Other long term (current) drug therapy: Secondary | ICD-10-CM | POA: Diagnosis not present

## 2023-01-03 DIAGNOSIS — I7300 Raynaud's syndrome without gangrene: Secondary | ICD-10-CM | POA: Diagnosis not present

## 2023-01-03 DIAGNOSIS — I350 Nonrheumatic aortic (valve) stenosis: Secondary | ICD-10-CM

## 2023-01-03 DIAGNOSIS — R232 Flushing: Secondary | ICD-10-CM | POA: Diagnosis not present

## 2023-01-03 DIAGNOSIS — R251 Tremor, unspecified: Secondary | ICD-10-CM | POA: Diagnosis not present

## 2023-01-03 DIAGNOSIS — E041 Nontoxic single thyroid nodule: Secondary | ICD-10-CM | POA: Diagnosis not present

## 2023-01-03 DIAGNOSIS — R42 Dizziness and giddiness: Secondary | ICD-10-CM

## 2023-01-03 DIAGNOSIS — R7301 Impaired fasting glucose: Secondary | ICD-10-CM | POA: Diagnosis not present

## 2023-01-03 DIAGNOSIS — I10 Essential (primary) hypertension: Secondary | ICD-10-CM

## 2023-01-03 DIAGNOSIS — N1831 Chronic kidney disease, stage 3a: Secondary | ICD-10-CM

## 2023-01-03 DIAGNOSIS — E042 Nontoxic multinodular goiter: Secondary | ICD-10-CM

## 2023-01-03 NOTE — Progress Notes (Signed)
Subjective:  Daniel [name] is a 77 y.o. male who presents for Chief Complaint  Patient presents with   Foot Swelling    Pt complains of redness in toes and swelling in both feet. Feet appear to look dark in color, possible fungi. Several weeks of redness and swelling.  Hard to walk on hard floors. Pt also feels weak and quivering or jittery before meals, has become more common recently.      Here for multiple symptom and concerns.  Lately feet look red and feel warm and swollen at times.  He has a pic on his phone of right foot looking more pinkish/red in general compared to left.  Elevated feet seems to help.  Seems to be worse as the day goes on.     He does get whitish fingertips at times, and in winter can get dry skin and cracking open of fingertips.  For 40 years the balls of his feet hurt.    On big toes, getting brown coloration under nails, worried about toenail fungus  Sometimes lately feels shaky like sugar is low.  Happened one morning recently after eating breakfast and taking dog for a walk.   Checks BPs a few times per week, usually 120-130s over 70s-80s.  Does not have glucometer.   Eating 3 meals daily + snacks.    No vertigo.  Sometimes gets nausea with the dizziness.  No palpitations, no edema, no SOB.   No URI symptoms.   No other aggravating or relieving factors.    No other c/o.  Past Medical History:  Diagnosis Date   Abdominal pain    with coffee   Abdominal ultrasound, abnormal 12/09/09   fatty liver disease   Abnormal carotid ultrasound 03/01/07   R no ICA stenosis. 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 Mosetta [name];   Arthritis    osteoarthritis back/spine   Basal cell carcinoma 06/15/2020   Left Breast (nodular)   Diverticulosis    Dyslipidemia    Dysplastic nevus 03/01/1993   slight, left back and mid lower back, no treatment   Dysplastic nevus 03/04/2004   moderate-marked, low mid back,  excision   Dysplastic nevus 06/14/2010   mild-mod, left hip, wider shave   Fatty liver    H/O CT scan of abdomen 03/20/12   abdomen/pelvis - normal   H/O CT scan of chest 03/20/12   normal   H/O diagnostic ultrasound 03/06/11   Aortic ultrasound - no AAA identified   H/O echocardiogram 10/18/2011   transthoracic - mild concentric LVH, EF 55%, mild valvular aortic stenosis, LA mild to moderate dilation, otherwise normal echo; Dr. Rollene [name]   Heart murmur, aortic    cardiac eval 09/2011, Dr. Gwenlyn [name] [name]), mild aortic stenosis   Hyperlipidemia    Hypertension    Inguinal hernia 2012   right   Normal cardiac stress test 03/02/07   Myoview, EF 65%; Dr. Golden [name]   SCC (squamous cell carcinoma) 09/17/2019   well diff, left sideburn, Cx3, 5FU   Statin intolerance    intolerance with Lipitor   Testicular pain 01/08/10   Urology eval, Dr. Alinda [name]   Current Outpatient Medications on File Prior to Visit  Medication Sig Dispense Refill   co-enzyme Q-10 30 MG capsule Take 30 mg by mouth 3 (three) times daily.     EPINEPHrine 0.3 mg/0.3 mL IJ SOAJ injection Use as directed 1 each 1   ezetimibe (  ZETIA) 10 MG tablet TAKE 1 TABLET(10 MG) BY MOUTH DAILY. 90 tablet 1   metoprolol succinate (TOPROL-XL) 50 MG 24 hr tablet TAKE 1 TABLET BY MOUTH EVERY EVENING AND 1/2 TABLET(25 MG TOTAL) EVERY MORNING. TAKE WITH OR IMMEDIATELY FOLLOWING A MEAL (Patient taking differently: Take 50 mg by mouth daily.) 135 tablet 3   Multiple Vitamin (MULTIVITAMIN) tablet Take 1 tablet by mouth daily.     pregabalin (LYRICA) 75 MG capsule Take 1 capsule (75 mg total) by mouth daily. 90 capsule 1   rosuvastatin (CRESTOR) 10 MG tablet TAKE 1 TABLET BY MOUTH EVERY OTHER DAY 90 tablet 2   vitamin B-12 (CYANOCOBALAMIN) 100 MCG tablet Take 100 mcg by mouth daily.     No current facility-administered medications on file prior to visit.     The following portions of the patient's history were reviewed and updated as appropriate:  allergies, current medications, past family history, past medical history, past social history, past surgical history and problem list.  ROS Otherwise as in subjective above  Objective: BP (!) 132/90   Pulse 64   Temp (!) 97.3 F (36.3 C)   Wt 208 lb 6.4 oz (94.5 kg)   BMI 28.66 kg/m   Wt Readings from Last 3 Encounters:  01/03/23 208 lb 6.4 oz (94.5 kg)  11/16/22 209 lb (94.8 kg)  09/15/22 202 lb 9.6 oz (91.9 kg)   BP Readings from Last 3 Encounters:  01/03/23 (!) 132/90  11/16/22 132/82  09/15/22 110/70    General appearance: alert, no distress, well developed, well nourished HEENT: normocephalic, sclerae anicteric, conjunctiva pink and moist, TMs pearly, nares patent, no discharge or erythema, pharynx normal Oral cavity: MMM, no lesions Neck: supple, no lymphadenopathy, no thyromegaly, no masses Heart: RRR, normal S1, S2, no murmurs Lungs: CTA bilaterally, no wheezes, rhonchi, or rales Pulses: 2+ radial pulses, 2+ pedal pulses, normal cap refill Ext: no edema    Assessment: Encounter Diagnoses  Name Primary?   Flushing Yes   Shakiness    Dizziness    High risk medication use    Raynaud's disease without gangrene    Thyroid nodule    Nonrheumatic aortic valve stenosis    Essential hypertension, benign    Impaired fasting blood sugar    Multinodular thyroid    Stage 3a chronic kidney disease (HCC)      Plan: We discussed his numerous symptoms and concerns  Flushing on pics but not on exam.  Discussed possible causes, etiology unclear.  Pedal pulses and cap refill normal, I reviewed labs he just had done in 07/2022.  platelets run a little low.       Dizziness - discussed long differential.  He denies fluctuations in BPs at home. He hasn't been diabetic or hypoglycemic prior.   He has had stable aortic stenosis, just saw cardiology in recent months,  he seems to be hydrating ok.  F/u pending labs  Continue other medications as usual .   Discussed possible  medication side effects that could cause some of his symptoms.   [name] was seen today for foot swelling.  Diagnoses and all orders for this visit:  Flushing -     TSH + free T4 -     ANA -     Uric acid -     Sedimentation rate -     CBC with Differential/Platelet  Shakiness  Dizziness -     Sedimentation rate -     CBC with Differential/Platelet -  Basic metabolic panel  High risk medication use  Raynaud's disease without gangrene -     ANA -     Uric acid -     Sedimentation rate  Thyroid nodule -     TSH + free T4  Nonrheumatic aortic valve stenosis  Essential hypertension, benign  Impaired fasting blood sugar  Multinodular thyroid  Stage 3a chronic kidney disease ([place])    Follow up: pending labs

## 2023-01-04 LAB — BASIC METABOLIC PANEL
BUN/Creatinine Ratio: 13 (ref 10–24)
BUN: 17 mg/dL (ref 8–27)
CO2: 24 mmol/L (ref 20–29)
Calcium: 9.6 mg/dL (ref 8.6–10.2)
Chloride: 103 mmol/L (ref 96–106)
Creatinine, Ser: 1.26 mg/dL (ref 0.76–1.27)
Glucose: 92 mg/dL (ref 70–99)
Potassium: 4.4 mmol/L (ref 3.5–5.2)
Sodium: 143 mmol/L (ref 134–144)
eGFR: 60 mL/min/{1.73_m2} (ref >59)

## 2023-01-04 LAB — CBC WITH DIFFERENTIAL/PLATELET
Basophils Absolute: 0.1 10*3/uL (ref 0.0–0.2)
Basos: 1 %
EOS (ABSOLUTE): 0.1 10*3/uL (ref 0.0–0.4)
Eos: 2 %
Hematocrit: 49.5 % (ref 37.5–51.0)
Hemoglobin: 16.4 g/dL (ref 13.0–17.7)
Immature Grans (Abs): 0 10*3/uL (ref 0.0–0.1)
Immature Granulocytes: 0 %
Lymphocytes Absolute: 2.3 10*3/uL (ref 0.7–3.1)
Lymphs: 36 %
MCH: 31 pg (ref 26.6–33.0)
MCHC: 33.1 g/dL (ref 31.5–35.7)
MCV: 94 fL (ref 79–97)
Monocytes Absolute: 0.7 10*3/uL (ref 0.1–0.9)
Monocytes: 11 %
Neutrophils Absolute: 3.1 10*3/uL (ref 1.4–7.0)
Neutrophils: 50 %
Platelets: 149 10*3/uL — ABNORMAL LOW (ref 150–450)
RBC: 5.29 x10E6/uL (ref 4.14–5.80)
RDW: 12.9 % (ref 11.6–15.4)
WBC: 6.3 10*3/uL (ref 3.4–10.8)

## 2023-01-04 LAB — URIC ACID: Uric Acid: 7.1 mg/dL (ref 3.8–8.4)

## 2023-01-04 LAB — SEDIMENTATION RATE: Sed Rate: 5 mm/hr (ref 0–30)

## 2023-01-04 LAB — ANA: Anti Nuclear Antibody (ANA): NEGATIVE

## 2023-01-04 LAB — TSH+FREE T4
Free T4: 1.15 ng/dL (ref 0.82–1.77)
TSH: 1.59 u[IU]/mL (ref 0.450–4.500)

## 2023-01-05 DIAGNOSIS — J3081 Allergic rhinitis due to animal (cat) (dog) hair and dander: Secondary | ICD-10-CM | POA: Diagnosis not present

## 2023-01-05 DIAGNOSIS — J301 Allergic rhinitis due to pollen: Secondary | ICD-10-CM | POA: Diagnosis not present

## 2023-01-05 DIAGNOSIS — J3089 Other allergic rhinitis: Secondary | ICD-10-CM | POA: Diagnosis not present

## 2023-01-05 NOTE — Progress Notes (Signed)
Labs show low but stable platelets. Otherwise blood counts normal, thyroid ok, autoimmune and inflammatory markers ok, electrolytes ok.  So no good explanation.  Next steps could include  head scan to rule out other issues, ENT or neurology consult.    If toes get red and swollen again, lets check xray.  Continue to hydrate well.  Let me know how you want to move forward

## 2023-01-19 DIAGNOSIS — J3089 Other allergic rhinitis: Secondary | ICD-10-CM | POA: Diagnosis not present

## 2023-01-31 ENCOUNTER — Ambulatory Visit (HOSPITAL_COMMUNITY): Payer: Medicare PPO | Attending: Cardiovascular Disease

## 2023-01-31 DIAGNOSIS — I10 Essential (primary) hypertension: Secondary | ICD-10-CM | POA: Diagnosis not present

## 2023-01-31 DIAGNOSIS — E785 Hyperlipidemia, unspecified: Secondary | ICD-10-CM | POA: Diagnosis not present

## 2023-01-31 DIAGNOSIS — I350 Nonrheumatic aortic (valve) stenosis: Secondary | ICD-10-CM

## 2023-01-31 LAB — ECHOCARDIOGRAM COMPLETE
AR max vel: 1.35 cm2
AV Area VTI: 1.37 cm2
AV Area mean vel: 1.34 cm2
AV Mean grad: 16 mmHg
AV Peak grad: 30.7 mmHg
Ao pk vel: 2.77 m/s
Area-P 1/2: 3.34 cm2
P 1/2 time: 339 msec
S' Lateral: 2.2 cm

## 2023-02-02 DIAGNOSIS — J301 Allergic rhinitis due to pollen: Secondary | ICD-10-CM | POA: Diagnosis not present

## 2023-02-02 DIAGNOSIS — J3081 Allergic rhinitis due to animal (cat) (dog) hair and dander: Secondary | ICD-10-CM | POA: Diagnosis not present

## 2023-02-02 DIAGNOSIS — J3089 Other allergic rhinitis: Secondary | ICD-10-CM | POA: Diagnosis not present

## 2023-02-06 NOTE — Progress Notes (Unsigned)
02/07/2023 Daniel [name] [id number] [birth year]  Referring provider: Carlena Hurl, PA-C Primary GI doctor: Dr. Henrene Pastor  ASSESSMENT AND PLAN:   Chronic idiopathic constipation - Increase fiber/ water intake, decrease caffeine, increase activity level. Will call back if any changes  Fatty liver with thrombocytopenia Fibrosis-4 (FIB-4) RESULT SUMMARY:2.73 points Advanced fibrosis (METAVIR stage F3-F4) likely [name] 2017) Approximate fibrosis stage: [name] 2-3 (Sterling et al 2006) Will get liver US with elastography to evaluate fibrosis Continue weight loss  Diverticulosis of colon without hemorrhage Will call if any symptoms. Add on fiber supplement, avoid NSAIDS, information given   Patient Care Team: Tysinger, Camelia Eng, PA-C as PCP - General (Family Medicine) [name], MD as PCP - Cardiology (Cardiology) [name], MD as Referring Physician (Gastroenterology) [name], MD as Attending Physician (Cardiology) Lavonna Monarch, MD (Inactive) as Consulting Physician (Dermatology)  HISTORY OF PRESENT ILLNESS: 77 y.o. male with a past medical history of hypertension, fatty liver, basal cell carcinoma, dyslipidemia, diverticulosis, CKD stage 3 and others listed below presents for evaluation of worsening constipation x 6 months.   02/06/2017 Limited abdominal ultrasound for elevated LFTs increased fat infiltration changes no gallstones or acute cholecystitis. 11/12/2020 colonoscopy Dr. Henrene Pastor for average screening risks previous examinations 2004, 2009, 2014 excellent bowel prep.  Diverticulosis no repeat colonoscopy due to age. 09/23/2022 patient called in complaining of constipation, started on ClearLax. 01/31/2023 echocardiogram 65 to 70%, moderate LVH, severely dilated coronary situs suggestive of persistent left superior vena cava moderate aortic valve stenosis  He intentionally lost 10-15 lbs, stopped his betablocker 8-10 months ago and restarted  it.  He was having hard stools for 6 months.  States was straining, was several days between.  He did not have any change in medications, no supplements.  He has added on fiber and did glycerin suppositories that helped, he is back to normal and almost canceled this appointment.  No AB pain, no melena, hematochezia.  Occ leg swelling, no AB swelling.  No AB bloating.   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 Liver function mildly elevated a year ago 03/09/2021 with alk phos 42, AST 39, ALT 49.   Most recent liver function test 08/04/2022 alk phos 55, AST 32, ALT 34. No anemia, no leukocytosis.  Kidney function CKD stage III. Normal thyroid.  No NSAIDS, no ETOH since about a year.   No drug use.No tobacco use.   He  reports that he has never smoked. He has never used smokeless tobacco. He reports current alcohol use of about 2.0 standard drinks of alcohol per week. He reports that he does not use drugs.  RELEVANT LABS AND IMAGING: CBC    Component Value Date/Time   WBC 6.3 01/03/2023 1246   WBC 5.6 11/19/2015 0001   RBC 5.29 01/03/2023 1246   RBC 5.27 11/19/2015 0001   HGB 16.4 01/03/2023 1246   HCT 49.5 01/03/2023 1246   PLT 149 (L) 01/03/2023 1246   MCV 94 01/03/2023 1246   MCH 31.0 01/03/2023 1246   MCH 31.3 11/19/2015 0001   MCHC 33.1 01/03/2023 1246   MCHC 33.7 11/19/2015 0001   RDW 12.9 01/03/2023 1246   LYMPHSABS 2.3 01/03/2023 1246   MONOABS 0.4 11/19/2015 0001   EOSABS 0.1 01/03/2023 1246   BASOSABS 0.1 01/03/2023 1246   Recent Labs    08/04/22 1153 01/03/23 1246  HGB 16.9 16.4     CMP     Component Value Date/Time  NA 143 01/03/2023 1246   K 4.4 01/03/2023 1246   CL 103 01/03/2023 1246   CO2 24 01/03/2023 1246   GLUCOSE 92 01/03/2023 1246   GLUCOSE 94 03/10/2017 0753   BUN 17 01/03/2023 1246   CREATININE 1.26 01/03/2023 1246   CREATININE 1.25 03/10/2017 0753   CALCIUM 9.6 01/03/2023 1246   PROT 7.2 08/04/2022 1153    ALBUMIN 4.8 08/04/2022 1153   AST 32 08/04/2022 1153   ALT 34 08/04/2022 1153   ALKPHOS 55 08/04/2022 1153   BILITOT 0.5 08/04/2022 1153   GFRNONAA 54 (L) 01/13/2021 1119   GFRAA 62 01/13/2021 1119      Latest Ref Rng & Units 08/04/2022   11:53 AM 03/09/2021   10:08 AM 06/16/2020    8:54 AM  Hepatic Function  Total Protein 6.0 - 8.5 g/dL 7.2  6.8  6.7   Albumin 3.8 - 4.8 g/dL 4.8  4.6  4.6   AST 0 - 40 IU/L 32  39  32   ALT 0 - 44 IU/L 34  49  41   Alk Phosphatase 44 - 121 IU/L 55  42  50   Total Bilirubin 0.0 - 1.2 mg/dL 0.5  0.6  0.6       Current Medications:    Current Outpatient Medications (Cardiovascular):    EPINEPHrine 0.3 mg/0.3 mL IJ SOAJ injection, Use as directed   ezetimibe (ZETIA) 10 MG tablet, TAKE 1 TABLET(10 MG) BY MOUTH DAILY.   metoprolol succinate (TOPROL-XL) 50 MG 24 hr tablet, TAKE 1 TABLET BY MOUTH EVERY EVENING AND 1/2 TABLET(25 MG TOTAL) EVERY MORNING. TAKE WITH OR IMMEDIATELY FOLLOWING A MEAL (Patient taking differently: Take 50 mg by mouth daily.)   rosuvastatin (CRESTOR) 10 MG tablet, TAKE 1 TABLET BY MOUTH EVERY OTHER DAY    Current Outpatient Medications (Hematological):    vitamin B-12 (CYANOCOBALAMIN) 100 MCG tablet, Take 100 mcg by mouth daily.  Current Outpatient Medications (Other):    co-enzyme Q-10 30 MG capsule, Take 30 mg by mouth 3 (three) times daily.   Multiple Vitamin (MULTIVITAMIN) tablet, Take 1 tablet by mouth daily.   pregabalin (LYRICA) 75 MG capsule, Take 1 capsule (75 mg total) by mouth daily.  Medical History:  Past Medical History:  Diagnosis Date   Abdominal pain    with coffee   Abdominal ultrasound, abnormal 12/09/09   fatty liver disease   Abnormal carotid ultrasound 03/01/07   R no ICA stenosis. 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 Mosetta Anis;   Arthritis    osteoarthritis back/spine   Basal cell carcinoma 06/15/2020   Left Breast (nodular)    Diverticulosis    Dyslipidemia    Dysplastic nevus 03/01/1993   slight, left back and mid lower back, no treatment   Dysplastic nevus 03/04/2004   moderate-marked, low mid back, excision   Dysplastic nevus 06/14/2010   mild-mod, left hip, wider shave   Fatty liver    H/O CT scan of abdomen 03/20/12   abdomen/pelvis - normal   H/O CT scan of chest 03/20/12   normal   H/O diagnostic ultrasound 03/06/11   Aortic ultrasound - no AAA identified   H/O echocardiogram 10/18/2011   transthoracic - mild concentric LVH, EF 55%, mild valvular aortic stenosis, LA mild to moderate dilation, otherwise normal echo; Dr. Rollene Fare   Heart murmur, aortic    cardiac eval 09/2011, Dr. Gwenlyn Found [facility]), mild aortic stenosis  Hyperlipidemia    Hypertension    Inguinal hernia 2012   right   Normal cardiac stress test 03/02/07   Myoview, EF 65%; Dr. 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 Alinda Money   Allergies:  Allergies  Allergen Reactions   Codeine Nausea Only and Other (See Comments)    Dizziness, sweat and will make patient pass out.   Lipitor [Atorvastatin]     Elevated LFTs, no symptoms   Sulfa Antibiotics Rash     Surgical History:  He  has a past surgical history that includes Shoulder surgery (2002, 2008); Nose surgery (2005); Hand surgery (2004); and Colonoscopy (10/21/2008). Family History:  His family history includes Cancer in his mother; Dementia (age of onset: 23) in his mother; Kidney disease (age of onset: 4) in his father; Rheum arthritis in his father.  REVIEW OF SYSTEMS  : All other systems reviewed and negative except where noted in the History of Present Illness.  PHYSICAL EXAM: BP 116/68   Pulse 70   Ht 5' 11.5" (1.816 m)   Wt 210 lb (95.3 kg)   BMI 28.88 kg/m  General Appearance: Well nourished, in no apparent distress. Head:    Normocephalic and atraumatic. Eyes:  sclerae anicteric,conjunctive pink  Respiratory: Respiratory effort normal, BS equal bilaterally without rales, rhonchi, wheezing. Cardio: RRR with no MRGs. Peripheral pulses intact.  Abdomen: Soft,  Obese ,active bowel sounds. No tenderness . Without guarding and Without rebound. No masses. Rectal: Not evaluated Musculoskeletal: Full ROM, Normal gait. Without edema. Skin:  Dry and intact without significant lesions or rashes Neuro: Alert and  oriented x4;  No focal deficits. Psych:  Cooperative. Normal mood and affect.    Vladimir Crofts, PA-C 10:03 AM

## 2023-02-07 ENCOUNTER — Encounter: Payer: Self-pay | Admitting: Physician Assistant

## 2023-02-07 ENCOUNTER — Ambulatory Visit (INDEPENDENT_AMBULATORY_CARE_PROVIDER_SITE_OTHER): Payer: Medicare PPO | Admitting: Physician Assistant

## 2023-02-07 VITALS — BP 116/68 | HR 70 | Ht 71.5 in | Wt 210.0 lb

## 2023-02-07 DIAGNOSIS — K5730 Diverticulosis of large intestine without perforation or abscess without bleeding: Secondary | ICD-10-CM

## 2023-02-07 DIAGNOSIS — K760 Fatty (change of) liver, not elsewhere classified: Secondary | ICD-10-CM | POA: Diagnosis not present

## 2023-02-07 DIAGNOSIS — K5904 Chronic idiopathic constipation: Secondary | ICD-10-CM | POA: Diagnosis not present

## 2023-02-07 DIAGNOSIS — D696 Thrombocytopenia, unspecified: Secondary | ICD-10-CM | POA: Diagnosis not present

## 2023-02-07 DIAGNOSIS — R972 Elevated prostate specific antigen [PSA]: Secondary | ICD-10-CM | POA: Diagnosis not present

## 2023-02-07 NOTE — Patient Instructions (Signed)
Metabolic dysfunction associated seatohepatitis  Now the leading cause of liver failure in the [place].  It is normally from such risk factors as obesity, diabetes, insulin resistance, high cholesterol, or metabolic syndrome.  The only definitive therapy is weight loss and exercise.   Suggest walking 20-30 mins daily.  Decreasing carbohydrates, increasing veggies.    Fatty Liver Fatty liver is the accumulation of fat in liver cells. It is also called hepatosteatosis or steatohepatitis. It is normal for your liver to contain some fat. If fat is more than 5 to 10% of your liver's weight, you have fatty liver.  There are often no symptoms (problems) for years while damage is still occurring. People often learn about their fatty liver when they have medical tests for other reasons. Fat can damage your liver for years or even decades without causing problems. When it becomes severe, it can cause fatigue, weight loss, weakness, and confusion. This makes you more likely to develop more serious liver problems. The liver is the largest organ in the body. It does a lot of work and often gives no warning signs when it is sick until late in a disease. The liver has many important jobs including: Breaking down foods. Storing vitamins, iron, and other minerals. Making proteins. Making bile for food digestion. Breaking down many products including medications, alcohol and some poisons.  PROGNOSIS  Fatty liver may cause no damage or it can lead to an inflammation of the liver. This is, called steatohepatitis.  Over time the liver may become scarred and hardened. This condition is called cirrhosis. Cirrhosis is serious and may lead to liver failure or cancer. NASH is one of the leading causes of cirrhosis. About 10-20% of Americans have fatty liver and a smaller 2-5% has NASH.  TREATMENT  Weight loss, fat restriction, and exercise in overweight patients produces inconsistent results but is worth  trying. Good control of diabetes may reduce fatty liver. Eat a balanced, healthy diet. Increase your physical activity. There are no medical or surgical treatments for a fatty liver or NASH, but improving your diet and increasing your exercise may help prevent or reverse some of the damage.        http://reyes-guerrero.[email or web address] Recommend starting on a fiber supplement, can try metamucil first but if this causes gas/bloating switch to benefiber or citracel, these do not cause gas.  Take with fiber with with a full 8 oz glass of water once a day. This can take 1 month to start helping, so try for at least one month.  Recommend increasing water and physical activity.   - Drink at least 64-80 ounces of water/liquid per day. - Establish a time to try to move your bowels every day.  For many people, this is after a cup of coffee or after a meal such as breakfast. - Sit all of the way back on the toilet keeping your back fairly straight and while sitting up, try to rest the tops of your forearms on your upper thighs.   - Raising your feet with a step stool/squatty potty can be helpful to improve the angle that allows your stool to pass through the rectum. - Relax the rectum feeling it bulge toward the toilet water.  If you feel your rectum raising toward your body, you are contracting rather than relaxing. - Breathe in and slowly exhale. "Belly breath" by expanding your belly towards your belly button. Keep belly expanded as you gently direct pressure down and back to the anus.  A low pitched GRRR sound can assist with increasing intra-abdominal pressure.  - Repeat 3-4 times. If unsuccessful, contract the pelvic floor to restore normal tone and get off the toilet.  Avoid excessive straining. - To reduce excessive wiping by teaching your anus to normally contract, place hands on outer aspect of knees and resist knee movement outward.  Hold 5-10 second then  place hands just inside of knees and resist inward movement of knees.  Hold 5 seconds.  Repeat a few times each way.  Go to the ER if unable to pass gas, severe AB pain, unable to hold down food, any shortness of breath of chest pain.  Diverticulosis Diverticulosis is a condition that develops when small pouches (diverticula) form in the wall of the large intestine (colon). The colon is where water is absorbed and stool (feces) is formed. The pouches form when the inside layer of the colon pushes through weak spots in the outer layers of the colon. You may have a few pouches or many of them. The pouches usually do not cause problems unless they become inflamed or infected. When this happens, the condition is called diverticulitis- this is left lower quadrant pain, diarrhea, fever, chills, nausea or vomiting.  If this occurs please call the office or go to the hospital. Sometimes these patches without inflammation can also have painless bleeding associated with them, if this happens please call the office or go to the hospital. Preventing constipation and increasing fiber can help reduce diverticula and prevent complications. Even if you feel you have a high-fiber diet, suggest getting on Benefiber or Cirtracel 2 times daily.   You have been scheduled for an abdominal ultrasound at [facility] (1st floor of hospital) on 02/13/2023 at 9:30am. Please arrive 30 minutes prior to your appointment for registration. Make certain not to have anything to eat or drink 8 hours prior to your appointment. Should you need to reschedule your appointment, please contact radiology at [phone]. This test typically takes about 30 minutes to perform.   Due to recent changes in healthcare laws, you may see the results of your imaging and laboratory studies on MyChart before your provider has had a chance to review them.  We understand that in some cases there may be results that are confusing or concerning to  you. Not all laboratory results come back in the same time frame and the provider may be waiting for multiple results in order to interpret others.  Please give [place] 48 hours in order for your provider to thoroughly review all the results before contacting the office for clarification of your results.    Thank you for choosing [facility]  [facility]

## 2023-02-07 NOTE — Progress Notes (Signed)
Noted  

## 2023-02-13 ENCOUNTER — Ambulatory Visit (HOSPITAL_COMMUNITY)
Admission: RE | Admit: 2023-02-13 | Discharge: 2023-02-13 | Disposition: A | Discharge status: Home / Self Care | Payer: Medicare PPO | Source: Ambulatory Visit | Attending: Physician Assistant | Admitting: Physician Assistant

## 2023-02-13 DIAGNOSIS — D696 Thrombocytopenia, unspecified: Secondary | ICD-10-CM | POA: Insufficient documentation

## 2023-02-13 DIAGNOSIS — R945 Abnormal results of liver function studies: Secondary | ICD-10-CM | POA: Diagnosis not present

## 2023-02-13 DIAGNOSIS — K760 Fatty (change of) liver, not elsewhere classified: Secondary | ICD-10-CM | POA: Insufficient documentation

## 2023-02-14 DIAGNOSIS — N400 Enlarged prostate without lower urinary tract symptoms: Secondary | ICD-10-CM | POA: Diagnosis not present

## 2023-02-14 DIAGNOSIS — R3915 Urgency of urination: Secondary | ICD-10-CM | POA: Diagnosis not present

## 2023-02-14 DIAGNOSIS — R3121 Asymptomatic microscopic hematuria: Secondary | ICD-10-CM | POA: Diagnosis not present

## 2023-02-16 DIAGNOSIS — J3081 Allergic rhinitis due to animal (cat) (dog) hair and dander: Secondary | ICD-10-CM | POA: Diagnosis not present

## 2023-02-16 DIAGNOSIS — J301 Allergic rhinitis due to pollen: Secondary | ICD-10-CM | POA: Diagnosis not present

## 2023-02-16 DIAGNOSIS — J3089 Other allergic rhinitis: Secondary | ICD-10-CM | POA: Diagnosis not present

## 2023-02-25 ENCOUNTER — Other Ambulatory Visit: Payer: Self-pay | Admitting: General Practice

## 2023-02-25 DIAGNOSIS — I10 Essential (primary) hypertension: Secondary | ICD-10-CM

## 2023-02-28 ENCOUNTER — Encounter: Payer: Self-pay | Admitting: Cardiovascular Disease

## 2023-03-01 DIAGNOSIS — J3081 Allergic rhinitis due to animal (cat) (dog) hair and dander: Secondary | ICD-10-CM | POA: Diagnosis not present

## 2023-03-01 DIAGNOSIS — J3089 Other allergic rhinitis: Secondary | ICD-10-CM | POA: Diagnosis not present

## 2023-03-01 DIAGNOSIS — J301 Allergic rhinitis due to pollen: Secondary | ICD-10-CM | POA: Diagnosis not present

## 2023-03-04 IMAGING — US US THYROID
2 series · 16 of 25 positions shown · non-contrast
Comparison: 06/12/2020
COMPARISON: 06/12/2020

Addendum:
CLINICAL DATA: 75-year-old male with a history of thyroid nodules

EXAM:
THYROID ULTRASOUND
TECHNIQUE: Ultrasound examination of the thyroid gland and adjacent soft
tissues was performed.

[Series 1: us thyroid · 0.07mm/px · 15 of 65 slices shown (1 of 2)]
[im 1/65]
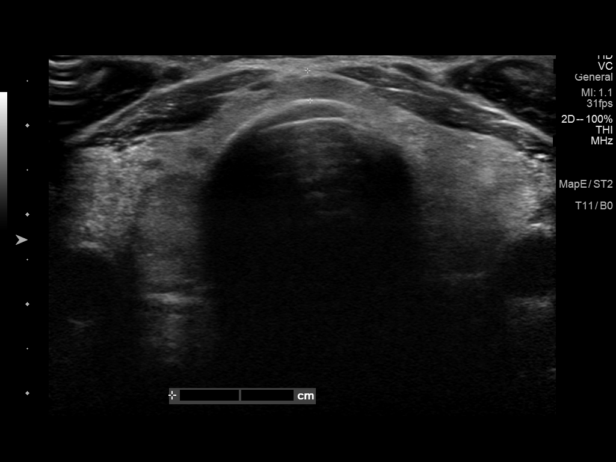
[im 6/65]
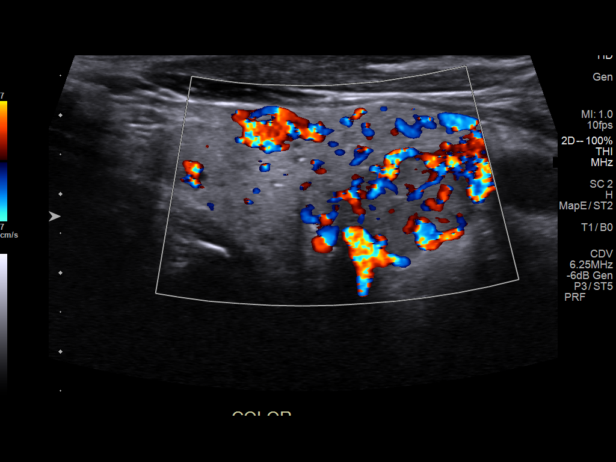
[im 9/65]
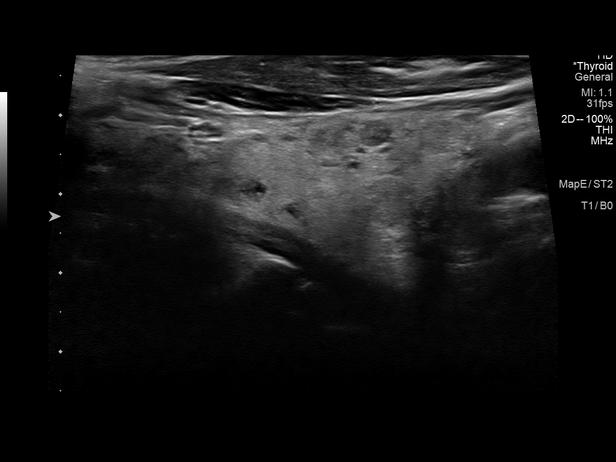
[im 14/65]
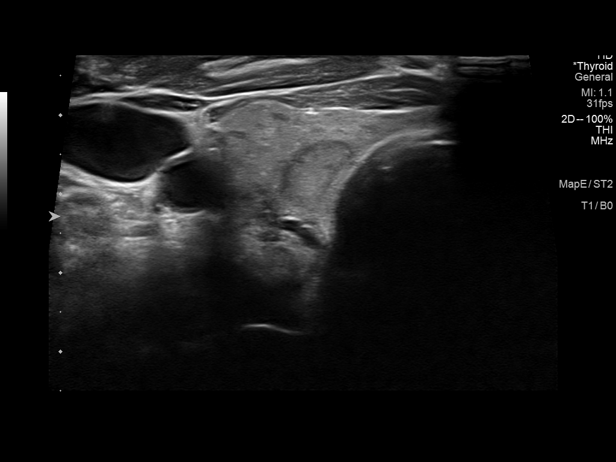
[im 20/65]
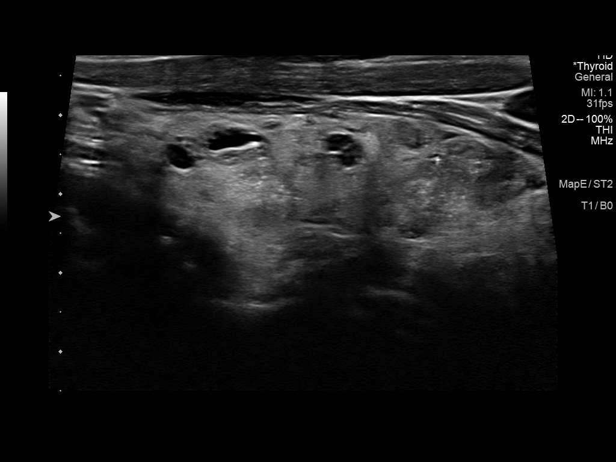
[im 23/65]
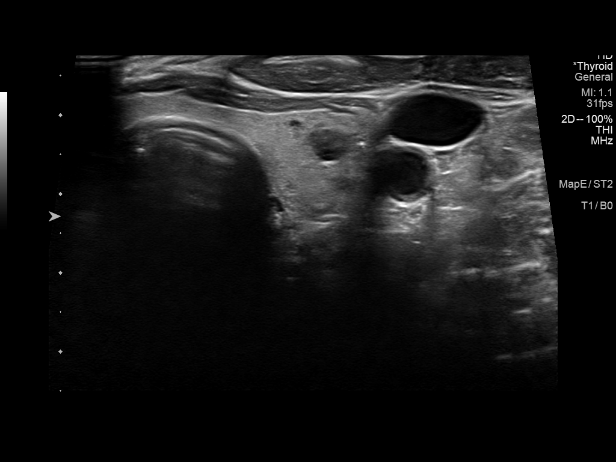
[im 28/65]
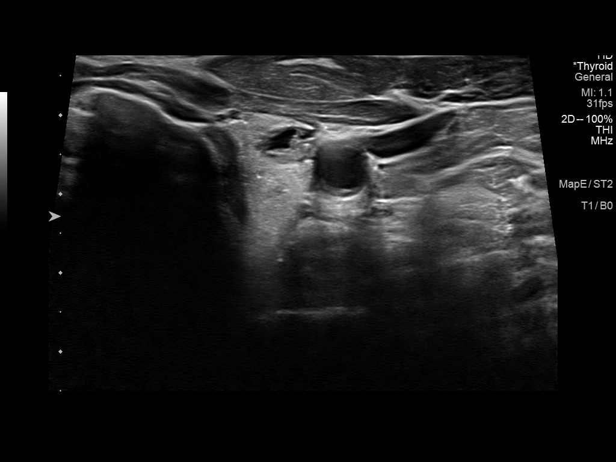
[im 31/65]
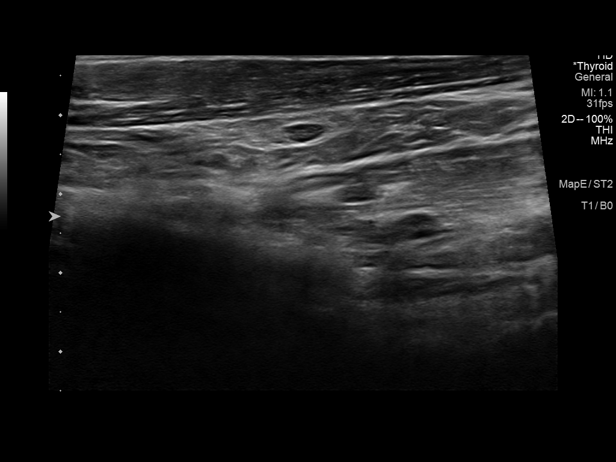
[im 37/65]
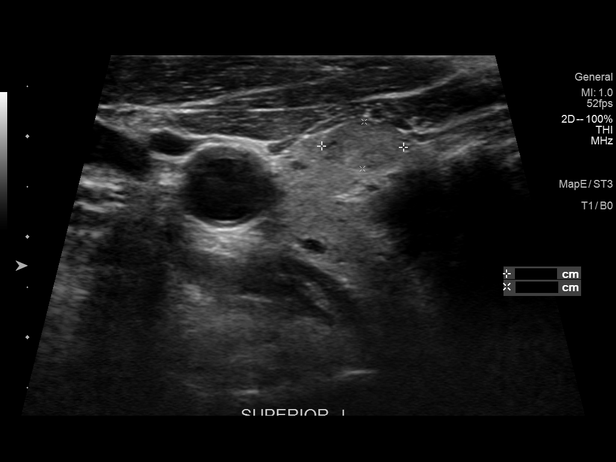
[im 39/65]
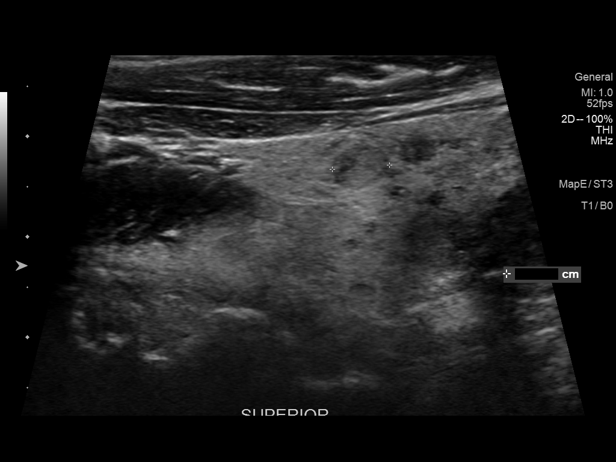
[im 45/65]
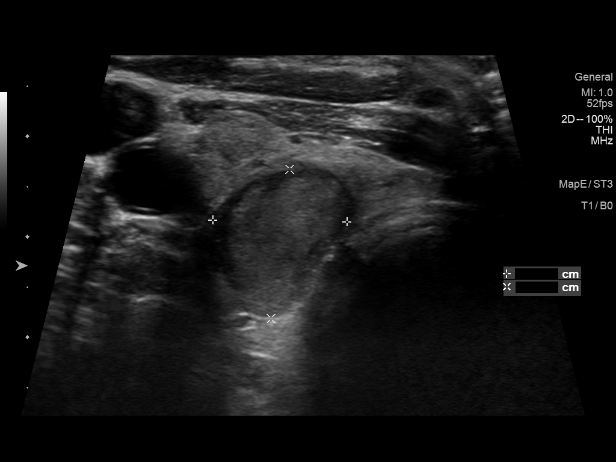
[im 48/65]
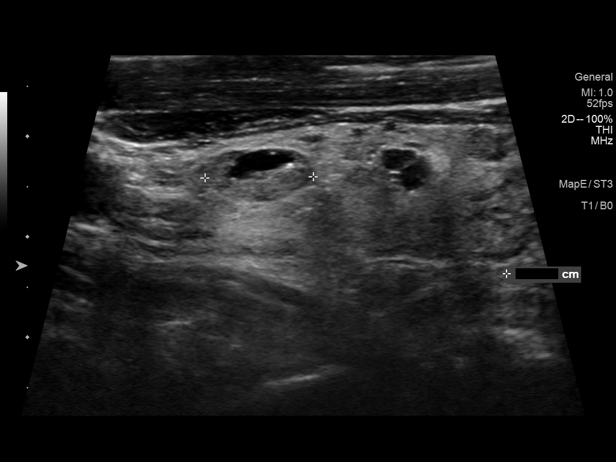
[im 53/65]
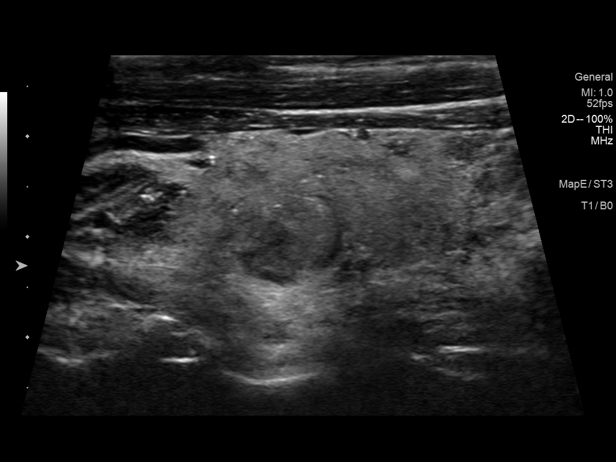
[im 59/65]
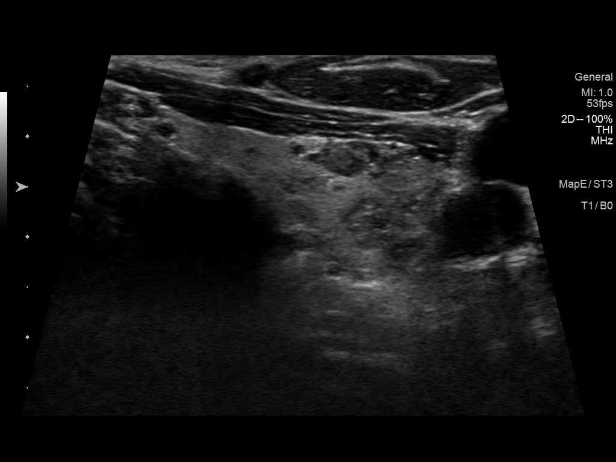
[im 62/65]
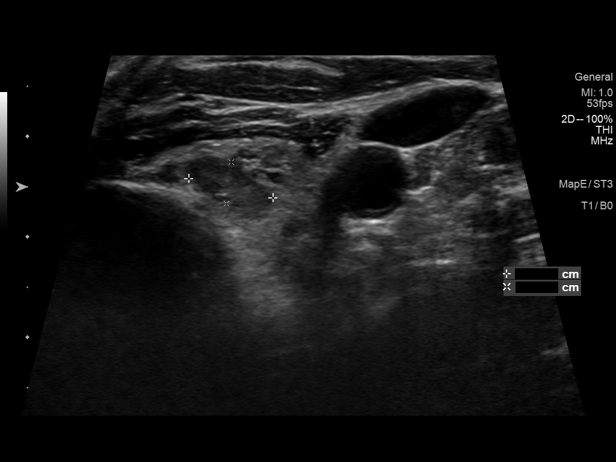

[Series 2001: us thyroid · 0.06mm/px · 1 of 4 slices shown (2 of 2)]
[im 1/4]
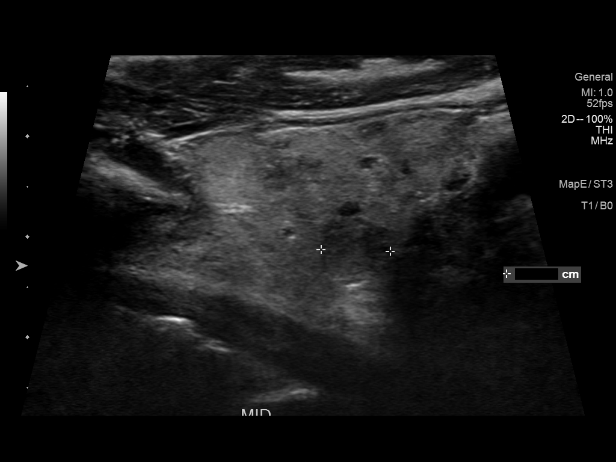

[16 of 25 positions shown; findings below may reference images not displayed]

FINDINGS: Parenchymal Echotexture: Moderately heterogenous

Isthmus: 0.3 cm

Right lobe: 4.8 cm x 2.1 cm x 1.8 cm

Left lobe: 5.4 cm x 1.6 cm x 1.5 cm

_________________________________________________________

Estimated total number of nodules >/= 1 cm: 3

Number of spongiform nodules >/=  2 cm not described below (TR1): 0

Number of mixed cystic and solid nodules >/= 1.5 cm not described
below (TR2): 0

_________________________________________________________

Nodule labeled 1, superior right thyroid, unchanged, 8 mm, and does
not meet criteria for surveillance or biopsy.

Nodule labeled 2, superior right thyroid, unchanged, 6 mm, and does
not meet criteria for surveillance or biopsy.

Nodule labeled 3, mid right thyroid, measuring 7 mm. I do not see
images on the current survey of nodule 3. Addendum will be required
after images are sent.

Nodule labeled 4 inferior right thyroid, 1.6 cm, previously labeled
3. Nodule now meets criteria for biopsy given the TR 4
characteristics.

Nodule labeled 5, superior left thyroid, 1.1 cm. This now has TR
2/cystic characteristics and does not meet criteria for surveillance
or biopsy.

Nodule labeled 6, mid left thyroid, 1.1 cm. This is unchanged and
was previously biopsied 07/10/2019. Assuming benign result no
further specific follow-up would be indicated.

Nodule labeled 7, inferior left thyroid, 8 mm, unchanged. Nodule
does not meet criteria for surveillance or biopsy.

Nodule labeled 8, inferior left thyroid, 9 mm, unchanged. Nodule
does not meet criteria for surveillance or biopsy.

No adenopathy.
IMPRESSION: Multinodular thyroid, as above.

Inferior right thyroid nodule (labeled 4, TR 4, 1.6 cm) meets
criteria for biopsy, as designated by the newly established ACR
TI-RADS criteria, and referral for biopsy is recommended.

Recommendations follow those established by the new ACR TI-RADS
criteria ([HOSPITAL] 3968;[DATE]).

ADDENDUM:
Addendum is included after additional images of labeled nodule 3.

Nodule labeled 3, mid right thyroid, measuring 7 mm. Nodule has TR 4
characteristics and does not meet criteria for surveillance or
biopsy.
IMPRESSION: Multinodular thyroid, as above.

Inferior right thyroid nodule (labeled 4, TR 4, 1.6 cm) meets
criteria for biopsy, as designated by the newly established ACR
TI-RADS criteria, and referral for biopsy is recommended.

Recommendations follow those established by the new ACR TI-RADS
criteria ([HOSPITAL] 3968;[DATE]).

*** End of Addendum ***
FINDINGS: Parenchymal Echotexture: Moderately heterogenous

Isthmus: 0.3 cm

Right lobe: 4.8 cm x 2.1 cm x 1.8 cm

Left lobe: 5.4 cm x 1.6 cm x 1.5 cm

_________________________________________________________

Estimated total number of nodules >/= 1 cm: 3

Number of spongiform nodules >/=  2 cm not described below (TR1): 0

Number of mixed cystic and solid nodules >/= 1.5 cm not described
below (TR2): 0

_________________________________________________________

Nodule labeled 1, superior right thyroid, unchanged, 8 mm, and does
not meet criteria for surveillance or biopsy.

Nodule labeled 2, superior right thyroid, unchanged, 6 mm, and does
not meet criteria for surveillance or biopsy.

Nodule labeled 3, mid right thyroid, measuring 7 mm. I do not see
images on the current survey of nodule 3. Addendum will be required
after images are sent.

Nodule labeled 4 inferior right thyroid, 1.6 cm, previously labeled
3. Nodule now meets criteria for biopsy given the TR 4
characteristics.

Nodule labeled 5, superior left thyroid, 1.1 cm. This now has TR
2/cystic characteristics and does not meet criteria for surveillance
or biopsy.

Nodule labeled 6, mid left thyroid, 1.1 cm. This is unchanged and
was previously biopsied 07/10/2019. Assuming benign result no
further specific follow-up would be indicated.

Nodule labeled 7, inferior left thyroid, 8 mm, unchanged. Nodule
does not meet criteria for surveillance or biopsy.

Nodule labeled 8, inferior left thyroid, 9 mm, unchanged. Nodule
does not meet criteria for surveillance or biopsy.

No adenopathy.
IMPRESSION: Multinodular thyroid, as above.

Inferior right thyroid nodule (labeled 4, TR 4, 1.6 cm) meets
criteria for biopsy, as designated by the newly established ACR
TI-RADS criteria, and referral for biopsy is recommended.

Recommendations follow those established by the new ACR TI-RADS
criteria ([HOSPITAL] [id number];[DATE]).

## 2023-03-09 DIAGNOSIS — N281 Cyst of kidney, acquired: Secondary | ICD-10-CM | POA: Diagnosis not present

## 2023-03-09 DIAGNOSIS — R3121 Asymptomatic microscopic hematuria: Secondary | ICD-10-CM | POA: Diagnosis not present

## 2023-03-09 DIAGNOSIS — K5730 Diverticulosis of large intestine without perforation or abscess without bleeding: Secondary | ICD-10-CM | POA: Diagnosis not present

## 2023-03-13 DIAGNOSIS — R3121 Asymptomatic microscopic hematuria: Secondary | ICD-10-CM | POA: Diagnosis not present

## 2023-03-17 ENCOUNTER — Ambulatory Visit (HOSPITAL_COMMUNITY)
Admission: EM | Admit: 2023-03-17 | Discharge: 2023-03-17 | Disposition: A | Discharge status: Home / Self Care | Payer: Medicare PPO | Attending: Emergency Medicine | Admitting: Emergency Medicine

## 2023-03-17 ENCOUNTER — Encounter (HOSPITAL_COMMUNITY): Payer: Self-pay

## 2023-03-17 DIAGNOSIS — B349 Viral infection, unspecified: Secondary | ICD-10-CM | POA: Insufficient documentation

## 2023-03-17 DIAGNOSIS — J3089 Other allergic rhinitis: Secondary | ICD-10-CM | POA: Diagnosis not present

## 2023-03-17 DIAGNOSIS — J3081 Allergic rhinitis due to animal (cat) (dog) hair and dander: Secondary | ICD-10-CM | POA: Diagnosis not present

## 2023-03-17 DIAGNOSIS — R319 Hematuria, unspecified: Secondary | ICD-10-CM | POA: Diagnosis not present

## 2023-03-17 DIAGNOSIS — J301 Allergic rhinitis due to pollen: Secondary | ICD-10-CM | POA: Diagnosis not present

## 2023-03-17 LAB — POCT INFLUENZA A/B
Influenza A, POC: NEGATIVE
Influenza B, POC: NEGATIVE

## 2023-03-17 LAB — POCT URINALYSIS DIP (MANUAL ENTRY)
Bilirubin, UA: NEGATIVE
Glucose, UA: NEGATIVE mg/dL
Ketones, POC UA: NEGATIVE mg/dL
Nitrite, UA: NEGATIVE
Protein Ur, POC: 30 mg/dL — AB
Spec Grav, UA: 1.02 (ref 1.010–1.025)
Urobilinogen, UA: 0.2 E.U./dL
pH, UA: 7 (ref 5.0–8.0)

## 2023-03-17 NOTE — ED Triage Notes (Addendum)
Patient c/o fever, loss of appetite, joint pain, and chills x 2 days.  Patient states that he has had these symptoms since having a cystoscopy 3 days ago. Patient reports that he had bloody urine and pain afterwards and states this lasted 2 days.  Patient states he has been taking flomax which was prescribed today.

## 2023-03-17 NOTE — Discharge Instructions (Addendum)
It is normal to have blood in your urine and discomfort after a cystoscopy.  Please take your Flomax as prescribed.  Your urine did have red blood cells and trace leukocytes, we are sending this off for culture and we will call if we need to start you on antibiotics for urinary tract infection.  We have tested you for flu in clinic and it was negative. Please take tylenol as needed for your symptoms.   Please seek immediate care if you develop high fever despite medication, or unable to urinate, develop flank pain, emesis, or abdominal pain.

## 2023-03-17 NOTE — ED Provider Notes (Signed)
[facility]    CSN: [id number] Arrival date & time: 03/17/23  1912      History   Chief Complaint Chief Complaint  Patient presents with   Fever   Joint Pain   Chills   Anorexia    HPI Daniel [name] is a 77 y.o. male.     Reports bright red blood after cystoscopy with urination, cytoscopy was completed Monday. Wednesday he developed chills. Last night he had hot and cold chills.  Reports fatigue, decreased appetite, reports body aches, has not really been able to eat today.  Just started Flomax today.   Denies cough, denies nasal drainage. Denies sore throat, chest pain or SOB.   The history is provided by the patient and medical records.  Fever Associated symptoms: chills   Associated symptoms: no chest pain, no diarrhea, no dysuria, no nausea, no sore throat and no vomiting     Past Medical History:  Diagnosis Date   Abdominal pain    with coffee   Abdominal ultrasound, abnormal 12/09/09   fatty liver disease   Abnormal carotid ultrasound 03/01/07   R no ICA stenosis. 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 Sidney Ace;   Arthritis    osteoarthritis back/spine   Basal cell carcinoma 06/15/2020   Left Breast (nodular)   Diverticulosis    Dyslipidemia    Dysplastic nevus 03/01/1993   slight, left back and mid lower back, no treatment   Dysplastic nevus 03/04/2004   moderate-marked, low mid back, excision   Dysplastic nevus 06/14/2010   mild-mod, left hip, wider shave   Fatty liver    H/O CT scan of abdomen 03/20/12   abdomen/pelvis - normal   H/O CT scan of chest 03/20/12   normal   H/O diagnostic ultrasound 03/06/11   Aortic ultrasound - no AAA identified   H/O echocardiogram 10/18/2011   transthoracic - mild concentric LVH, EF 55%, mild valvular aortic stenosis, LA mild to moderate dilation, otherwise normal echo; Dr. Alanda Amass   Heart murmur, aortic    cardiac eval 09/2011, Dr. Allyson Sabal  [facility]), mild aortic stenosis   Hyperlipidemia    Hypertension    Inguinal hernia 2012   right   Normal cardiac stress test 03/02/07   Myoview, EF 65%; Dr. 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 Yancey Flemings, [place]   HAND SURGERY  2004   left ganglion cyst removed    NOSE SURGERY  2005   obstruction, polyp and deviated septum; Dr. Gae Bon SURGERY  2002, 2008   bilat, RTC       Home Medications    Prior to Admission medications   Medication Sig Start Date End Date Taking? Authorizing Provider  tamsulosin (FLOMAX) 0.4 MG CAPS capsule Take 0.4 mg by mouth daily.   Yes [provider]  co-enzyme Q-10 30 MG capsule Take 30 mg by mouth 3 (three) times daily.    [provider]   EPINEPHrine 0.3 mg/0.3 mL IJ SOAJ injection Use as directed 08/05/22   Tysinger, Kermit Balo, PA-C  ezetimibe (ZETIA) 10 MG tablet TAKE 1 TABLET(10 MG) BY MOUTH DAILY. 12/12/22   Tysinger, Kermit Balo, PA-C  metoprolol succinate (TOPROL-XL) 50 MG 24 hr tablet TAKE 1/2 TABLET BY MOUTH EVERY MORNING AND 1 TABLET EVERY EVENING. TAKE WITH OR IMMEDIATELY FOLLOWING A MEAL 02/27/23   Runell Gess, MD  Multiple Vitamin (MULTIVITAMIN) tablet Take 1 tablet by mouth daily.    [provider]  pregabalin (LYRICA) 75 MG capsule Take 1 capsule (75 mg total) by mouth daily. 08/05/22   Tysinger, Kermit Balo, PA-C  rosuvastatin (CRESTOR) 10 MG tablet TAKE 1 TABLET BY MOUTH EVERY OTHER DAY 11/03/22   Tysinger, Kermit Balo, PA-C  vitamin B-12 (CYANOCOBALAMIN) 100 MCG tablet Take 100 mcg by mouth daily.    [provider]    Family History Family History  Problem Relation Age of Onset   Cancer Mother        breast   Dementia Mother 47   Kidney disease Father 1       kidney failure   Rheum arthritis Father    Diabetes Neg Hx    Heart disease Neg Hx    Stroke Neg Hx    Hypertension Neg Hx    Hyperlipidemia Neg Hx    Colon cancer Neg Hx    Colon polyps Neg Hx    Esophageal cancer Neg Hx    Rectal cancer Neg Hx    Stomach cancer Neg Hx     Social History Social History   Tobacco Use   Smoking status: Never   Smokeless tobacco: Never  Vaping Use   Vaping Use: Never used  Substance Use Topics   Alcohol use: Yes    Alcohol/week: 2.0 standard drinks of alcohol    Types: 2 Cans of beer per week    Comment: non alcoholic beer   Drug use: No     Allergies   Codeine, Lipitor [atorvastatin], and Sulfa antibiotics   Review of Systems Review of Systems  Constitutional:  Positive for appetite change, chills and fever.  HENT:  Negative for sore throat.   Respiratory:  Negative for choking and shortness of breath.   Cardiovascular:  Negative for chest pain.  Gastrointestinal:  Negative for  abdominal pain, diarrhea, nausea and vomiting.  Genitourinary:  Positive for difficulty urinating and hematuria. Negative for dysuria, flank pain and frequency.  Musculoskeletal:  Positive for arthralgias. Negative for back pain.     Physical Exam Triage Vital Signs ED Triage Vitals  Enc Vitals Group     BP 03/17/23 1952 (!) 150/90     Pulse Rate 03/17/23 1952 82     Resp 03/17/23 1952 16     Temp 03/17/23 1952 100 F (37.8 C)  Temp Source 03/17/23 1952 Oral     SpO2 03/17/23 1952 96 %     Weight --      Height --      Head Circumference --      Peak Flow --      Pain Score 03/17/23 1954 3     Pain Loc --      Pain Edu? --      Excl. in GC? --    No data found.  Updated Vital Signs BP (!) 150/90 (BP Location: Right Arm)   Pulse 82   Temp 100 F (37.8 C) (Oral)   Resp 16   SpO2 96%   Visual Acuity Right Eye Distance:   Left Eye Distance:   Bilateral Distance:    Right Eye Near:   Left Eye Near:    Bilateral Near:     Physical Exam Vitals and nursing note reviewed.  Constitutional:      Appearance: Normal appearance.  HENT:     Head: Normocephalic and atraumatic.     Right Ear: External ear normal.     Left Ear: External ear normal.     Nose: Nose normal.     Mouth/Throat:     Mouth: Mucous membranes are moist.  Eyes:     Conjunctiva/sclera: Conjunctivae normal.  Cardiovascular:     Rate and Rhythm: Normal rate and regular rhythm.     Heart sounds: Murmur heard.  Pulmonary:     Effort: Pulmonary effort is normal. No respiratory distress.     Breath sounds: Normal breath sounds.  Abdominal:     General: Abdomen is flat. Bowel sounds are normal. There is no distension.     Palpations: Abdomen is soft. There is no mass.     Tenderness: There is no abdominal tenderness. There is no right CVA tenderness, left CVA tenderness, guarding or rebound.     Hernia: No hernia is present.  Musculoskeletal:        General: No swelling. Normal range of motion.   Skin:    General: Skin is warm and dry.  Neurological:     General: No focal deficit present.     Mental Status: He is alert and oriented to person, place, and time.  Psychiatric:        Mood and Affect: Mood normal.        Behavior: Behavior normal.      UC Treatments / Results  Labs (all labs ordered are listed, but only abnormal results are displayed) Labs Reviewed  POCT URINALYSIS DIP (MANUAL ENTRY) - Abnormal; Notable for the following components:      Result Value   Blood, UA moderate (*)    Protein Ur, POC =30 (*)    Leukocytes, UA Trace (*)    All other components within normal limits  URINE CULTURE  POCT INFLUENZA A/B    EKG   Radiology No results found.  Procedures Procedures (including critical care time)  Medications Ordered in UC Medications - No data to display  Initial Impression / Assessment and Plan / UC Course  I have reviewed the triage vital signs and the nursing notes.  Pertinent labs & imaging results that were available during my care of the patient were reviewed by me and considered in my medical decision making (see chart for details).  Vitals and triage reviewed, patient is hemodynamically stable.  Cystoscopy 5 days ago and since then patient has been having hematuria and difficulty urinating.  Urinalysis in clinic  with small leuks and blood, will send for culture.  Patient without CVA tenderness or suprapubic tenderness.  On Wednesday patient developed body aches and chills, no sore throat or upper respiratory symptoms.  Suspect potential viral etiology, negative flu swab in clinic, declined COVID-19 testing.  Strict emergency room return precautions given, patient verbalized understanding, no questions at this time.     Final Clinical Impressions(s) / UC Diagnoses   Final diagnoses:  Viral syndrome  Hematuria, unspecified type     Discharge Instructions      It is normal to have blood in your urine and discomfort after a  cystoscopy.  Please take your Flomax as prescribed.  Your urine did have red blood cells and trace leukocytes, we are sending this off for culture and we will call if we need to start you on antibiotics for urinary tract infection.  We have tested you for flu in clinic and it was negative. Please take tylenol as needed for your symptoms.   Please seek immediate care if you develop high fever despite medication, or unable to urinate, develop flank pain, emesis, or abdominal pain.      ED Prescriptions   None    PDMP not reviewed this encounter.   Ellinore Merced, [place], [place] 03/17/23 2053

## 2023-03-18 ENCOUNTER — Other Ambulatory Visit: Payer: Self-pay | Admitting: Medical

## 2023-03-18 DIAGNOSIS — E785 Hyperlipidemia, unspecified: Secondary | ICD-10-CM

## 2023-03-20 ENCOUNTER — Telehealth (HOSPITAL_COMMUNITY): Payer: Self-pay | Admitting: Emergency Medicine

## 2023-03-20 LAB — URINE CULTURE: Culture: >=100000 — AB

## 2023-03-20 MED ORDER — CEPHALEXIN 500 MG PO CAPS: 500.0000 mg | ORAL_CAPSULE | Freq: Two times a day (BID) | ORAL | 0 refills | Status: AC

## 2023-03-20 MED ORDER — CEPHALEXIN 500 MG PO CAPS: 500.0000 mg | ORAL_CAPSULE | Freq: Two times a day (BID) | ORAL | 0 refills | Status: DC

## 2023-03-20 NOTE — Telephone Encounter (Signed)
Patient requested a different pharmacy, resent ?

## 2023-03-24 ENCOUNTER — Ambulatory Visit (INDEPENDENT_AMBULATORY_CARE_PROVIDER_SITE_OTHER): Payer: Medicare PPO | Admitting: Medical

## 2023-03-24 ENCOUNTER — Encounter: Payer: Self-pay | Admitting: Medical

## 2023-03-24 VITALS — BP 128/78 | HR 76 | Ht 71.5 in | Wt 207.2 lb

## 2023-03-24 DIAGNOSIS — R238 Other skin changes: Secondary | ICD-10-CM | POA: Diagnosis not present

## 2023-03-24 DIAGNOSIS — N3001 Acute cystitis with hematuria: Secondary | ICD-10-CM | POA: Diagnosis not present

## 2023-03-24 DIAGNOSIS — Z9889 Other specified postprocedural states: Secondary | ICD-10-CM | POA: Diagnosis not present

## 2023-03-24 LAB — POCT URINALYSIS DIP (PROADVANTAGE DEVICE)
Bilirubin, UA: NEGATIVE
Glucose, UA: NEGATIVE mg/dL
Ketones, POC UA: NEGATIVE mg/dL
Leukocytes, UA: NEGATIVE
Nitrite, UA: NEGATIVE
Protein Ur, POC: NEGATIVE mg/dL
Specific Gravity, Urine: 1.01
Urobilinogen, Ur: 0.2
pH, UA: 7 (ref 5.0–8.0)

## 2023-03-24 NOTE — Progress Notes (Signed)
Subjective:  Daniel [name] is a 77 y.o. male who presents for Chief Complaint  Patient presents with   Follow-up    Follow up on UTI on prostate issues. Saw Dr Cardell Peach at [facility], has CT and cystoscopy. Having some discomfort after procedure. Went to UC on 03/17/23.      Here for f/u.    He notes that he sees [facility] about once yearly.  Had recent visit, had cystoscopy and prostate check.  PSA was upper end of normal.  He had microscopic hematuria, and on a subsequent appt had CT scan and had cystoscopy to evaluate.  He notes blood in urine for 2 days after, burning in urethra.  By the next Wednesday, 3 days later had body aches, chills, burning with urination, fever.  Went to urgent care 03/17/23 , diagnosed with UTI.   Had culture done.  This past Monday 5 days ago, culture result showed staph epidermis.   He has been on antibiotic all week.   Has 1 more day left, keflex.  Feels better today, 85% better.    Was seeing Dr. Jorja Loa dermatology prior before he retired.  Now seeing [facility], but has concern about itchy spot in middle of back x few days.  No sting or bite.  Using some prescription steroid cream that is helping, just wanted it looked at.  No other aggravating or relieving factors.    No other c/o.  Past Medical History:  Diagnosis Date   Abdominal pain    with coffee   Abdominal ultrasound, abnormal 12/09/09   fatty liver disease   Abnormal carotid ultrasound 03/01/07   R no ICA stenosis. 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 Sidney Ace;   Arthritis    osteoarthritis back/spine   Basal cell carcinoma 06/15/2020   Left Breast (nodular)   Diverticulosis    Dyslipidemia    Dysplastic nevus 03/01/1993   slight, left back and mid lower back, no treatment   Dysplastic nevus 03/04/2004   moderate-marked, low mid back, excision   Dysplastic nevus 06/14/2010   mild-mod, left hip, wider shave   Fatty liver    H/O CT  scan of abdomen 03/20/12   abdomen/pelvis - normal   H/O CT scan of chest 03/20/12   normal   H/O diagnostic ultrasound 03/06/11   Aortic ultrasound - no AAA identified   H/O echocardiogram 10/18/2011   transthoracic - mild concentric LVH, EF 55%, mild valvular aortic stenosis, LA mild to moderate dilation, otherwise normal echo; Dr. Alanda Amass   Heart murmur, aortic    cardiac eval 09/2011, Dr. Allyson Sabal [facility]), mild aortic stenosis   Hyperlipidemia    Hypertension    Inguinal hernia 2012   right   Normal cardiac stress test 03/02/07   Myoview, EF 65%; Dr. 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 Laverle Patter   Current Outpatient Medications on File Prior to Visit  Medication Sig Dispense Refill   cephALEXin (KEFLEX) 500 MG capsule Take 1 capsule (500 mg total) by mouth 2 (two) times daily for 5 days. 10 capsule 0   co-enzyme Q-10 30 MG capsule Take 30 mg by mouth 3 (three) times daily.     ezetimibe (ZETIA) 10 MG tablet TAKE 1 TABLET(10 MG) BY MOUTH DAILY  90 tablet 1   metoprolol succinate (TOPROL-XL) 50 MG 24 hr tablet TAKE 1/2 TABLET BY MOUTH EVERY MORNING AND 1 TABLET EVERY EVENING. TAKE WITH OR IMMEDIATELY FOLLOWING A MEAL 135 tablet 3   pregabalin (LYRICA) 75 MG capsule Take 1 capsule (75 mg total) by mouth daily. 90 capsule 1   rosuvastatin (CRESTOR) 10 MG tablet TAKE 1 TABLET BY MOUTH EVERY OTHER DAY 90 tablet 2   vitamin B-12 (CYANOCOBALAMIN) 100 MCG tablet Take 100 mcg by mouth daily.     EPINEPHrine 0.3 mg/0.3 mL IJ SOAJ injection Use as directed (Patient not taking: Reported on 03/24/2023) 1 each 1   tamsulosin (FLOMAX) 0.4 MG CAPS capsule Take 0.4 mg by mouth daily. (Patient not taking: Reported on 03/24/2023)     No current facility-administered medications on file prior to visit.    The following portions of the patient's history were  reviewed and updated as appropriate: allergies, current medications, past family history, past medical history, past social history, past surgical history and problem list.  ROS Otherwise as in subjective above    Objective: BP 128/78   Pulse 76   Ht 5' 11.5" (1.816 m)   Wt 207 lb 3.2 oz (94 kg)   SpO2 98%   BMI 28.50 kg/m   General appearance: alert, no distress, well developed, well nourished Skin: In the middle the back there is a small 3 mm area of erythema without growth or skin lesion Abdomen: +bs, soft, non tender, non distended, no masses, no hepatomegaly, no splenomegaly Pulses: 2+ radial pulses, 2+ pedal pulses, normal cap refill Ext: no edema   Assessment: Encounter Diagnoses  Name Primary?   Acute cystitis with hematuria Yes   S/P cystoscopy    Skin irritation      Plan: Status post cystoscopy and acute cystitis subsequent to the procedure-mostly improved at this point.  Advised to finish out the Keflex antibiotic.  Urinalysis reviewed today.  He does feel much better.  Skin irritation-continue steroid cream for the next few days until resolved  [name] was seen today for follow-up.  Diagnoses and all orders for this visit:  Acute cystitis with hematuria -     POCT Urinalysis DIP (Proadvantage Device)  S/P cystoscopy -     POCT Urinalysis DIP (Proadvantage Device)  Skin irritation    Follow up: prn

## 2023-03-29 DIAGNOSIS — J3081 Allergic rhinitis due to animal (cat) (dog) hair and dander: Secondary | ICD-10-CM | POA: Diagnosis not present

## 2023-03-29 DIAGNOSIS — J3089 Other allergic rhinitis: Secondary | ICD-10-CM | POA: Diagnosis not present

## 2023-03-29 DIAGNOSIS — J301 Allergic rhinitis due to pollen: Secondary | ICD-10-CM | POA: Diagnosis not present

## 2023-04-12 DIAGNOSIS — J301 Allergic rhinitis due to pollen: Secondary | ICD-10-CM | POA: Diagnosis not present

## 2023-04-12 DIAGNOSIS — J3081 Allergic rhinitis due to animal (cat) (dog) hair and dander: Secondary | ICD-10-CM | POA: Diagnosis not present

## 2023-04-12 DIAGNOSIS — J3089 Other allergic rhinitis: Secondary | ICD-10-CM | POA: Diagnosis not present

## 2023-04-18 DIAGNOSIS — J3089 Other allergic rhinitis: Secondary | ICD-10-CM | POA: Diagnosis not present

## 2023-04-25 DIAGNOSIS — H5203 Hypermetropia, bilateral: Secondary | ICD-10-CM | POA: Diagnosis not present

## 2023-04-26 DIAGNOSIS — L821 Other seborrheic keratosis: Secondary | ICD-10-CM | POA: Diagnosis not present

## 2023-04-27 DIAGNOSIS — J3089 Other allergic rhinitis: Secondary | ICD-10-CM | POA: Diagnosis not present

## 2023-04-27 DIAGNOSIS — J3081 Allergic rhinitis due to animal (cat) (dog) hair and dander: Secondary | ICD-10-CM | POA: Diagnosis not present

## 2023-04-27 DIAGNOSIS — J301 Allergic rhinitis due to pollen: Secondary | ICD-10-CM | POA: Diagnosis not present

## 2023-05-11 DIAGNOSIS — J3089 Other allergic rhinitis: Secondary | ICD-10-CM | POA: Diagnosis not present

## 2023-05-26 DIAGNOSIS — J301 Allergic rhinitis due to pollen: Secondary | ICD-10-CM | POA: Diagnosis not present

## 2023-05-26 DIAGNOSIS — J3089 Other allergic rhinitis: Secondary | ICD-10-CM | POA: Diagnosis not present

## 2023-05-26 DIAGNOSIS — J3081 Allergic rhinitis due to animal (cat) (dog) hair and dander: Secondary | ICD-10-CM | POA: Diagnosis not present

## 2023-06-07 DIAGNOSIS — J3089 Other allergic rhinitis: Secondary | ICD-10-CM | POA: Diagnosis not present

## 2023-06-07 DIAGNOSIS — J3081 Allergic rhinitis due to animal (cat) (dog) hair and dander: Secondary | ICD-10-CM | POA: Diagnosis not present

## 2023-06-07 DIAGNOSIS — J301 Allergic rhinitis due to pollen: Secondary | ICD-10-CM | POA: Diagnosis not present

## 2023-06-12 DIAGNOSIS — J301 Allergic rhinitis due to pollen: Secondary | ICD-10-CM | POA: Diagnosis not present

## 2023-06-12 DIAGNOSIS — J3089 Other allergic rhinitis: Secondary | ICD-10-CM | POA: Diagnosis not present

## 2023-06-12 DIAGNOSIS — J3081 Allergic rhinitis due to animal (cat) (dog) hair and dander: Secondary | ICD-10-CM | POA: Diagnosis not present

## 2023-06-19 DIAGNOSIS — J3089 Other allergic rhinitis: Secondary | ICD-10-CM | POA: Diagnosis not present

## 2023-06-19 DIAGNOSIS — J3081 Allergic rhinitis due to animal (cat) (dog) hair and dander: Secondary | ICD-10-CM | POA: Diagnosis not present

## 2023-06-19 DIAGNOSIS — J301 Allergic rhinitis due to pollen: Secondary | ICD-10-CM | POA: Diagnosis not present

## 2023-06-21 DIAGNOSIS — B078 Other viral warts: Secondary | ICD-10-CM | POA: Diagnosis not present

## 2023-06-21 DIAGNOSIS — L298 Other pruritus: Secondary | ICD-10-CM | POA: Diagnosis not present

## 2023-06-21 DIAGNOSIS — L538 Other specified erythematous conditions: Secondary | ICD-10-CM | POA: Diagnosis not present

## 2023-06-21 DIAGNOSIS — L820 Inflamed seborrheic keratosis: Secondary | ICD-10-CM | POA: Diagnosis not present

## 2023-06-26 DIAGNOSIS — J3081 Allergic rhinitis due to animal (cat) (dog) hair and dander: Secondary | ICD-10-CM | POA: Diagnosis not present

## 2023-06-26 DIAGNOSIS — J3089 Other allergic rhinitis: Secondary | ICD-10-CM | POA: Diagnosis not present

## 2023-06-26 DIAGNOSIS — J301 Allergic rhinitis due to pollen: Secondary | ICD-10-CM | POA: Diagnosis not present

## 2023-07-05 DIAGNOSIS — J301 Allergic rhinitis due to pollen: Secondary | ICD-10-CM | POA: Diagnosis not present

## 2023-07-05 DIAGNOSIS — J3081 Allergic rhinitis due to animal (cat) (dog) hair and dander: Secondary | ICD-10-CM | POA: Diagnosis not present

## 2023-07-05 DIAGNOSIS — J3089 Other allergic rhinitis: Secondary | ICD-10-CM | POA: Diagnosis not present

## 2023-07-10 DIAGNOSIS — L538 Other specified erythematous conditions: Secondary | ICD-10-CM | POA: Diagnosis not present

## 2023-07-10 DIAGNOSIS — L820 Inflamed seborrheic keratosis: Secondary | ICD-10-CM | POA: Diagnosis not present

## 2023-07-12 DIAGNOSIS — J3089 Other allergic rhinitis: Secondary | ICD-10-CM | POA: Diagnosis not present

## 2023-07-26 DIAGNOSIS — J3081 Allergic rhinitis due to animal (cat) (dog) hair and dander: Secondary | ICD-10-CM | POA: Diagnosis not present

## 2023-07-26 DIAGNOSIS — J301 Allergic rhinitis due to pollen: Secondary | ICD-10-CM | POA: Diagnosis not present

## 2023-07-26 DIAGNOSIS — J3089 Other allergic rhinitis: Secondary | ICD-10-CM | POA: Diagnosis not present

## 2023-08-07 ENCOUNTER — Ambulatory Visit (INDEPENDENT_AMBULATORY_CARE_PROVIDER_SITE_OTHER): Payer: Medicare PPO | Admitting: Medical

## 2023-08-07 ENCOUNTER — Encounter: Payer: Self-pay | Admitting: Medical

## 2023-08-07 VITALS — BP 120/74 | HR 54 | Ht 70.0 in | Wt 198.2 lb

## 2023-08-07 DIAGNOSIS — N1831 Chronic kidney disease, stage 3a: Secondary | ICD-10-CM | POA: Diagnosis not present

## 2023-08-07 DIAGNOSIS — E785 Hyperlipidemia, unspecified: Secondary | ICD-10-CM

## 2023-08-07 DIAGNOSIS — I350 Nonrheumatic aortic (valve) stenosis: Secondary | ICD-10-CM

## 2023-08-07 DIAGNOSIS — I6523 Occlusion and stenosis of bilateral carotid arteries: Secondary | ICD-10-CM

## 2023-08-07 DIAGNOSIS — R7301 Impaired fasting glucose: Secondary | ICD-10-CM | POA: Diagnosis not present

## 2023-08-07 DIAGNOSIS — Z0000 Encounter for general adult medical examination without abnormal findings: Secondary | ICD-10-CM | POA: Diagnosis not present

## 2023-08-07 DIAGNOSIS — G5790 Unspecified mononeuropathy of unspecified lower limb: Secondary | ICD-10-CM

## 2023-08-07 DIAGNOSIS — Z7189 Other specified counseling: Secondary | ICD-10-CM | POA: Diagnosis not present

## 2023-08-07 DIAGNOSIS — Z7185 Encounter for immunization safety counseling: Secondary | ICD-10-CM

## 2023-08-07 DIAGNOSIS — M48061 Spinal stenosis, lumbar region without neurogenic claudication: Secondary | ICD-10-CM

## 2023-08-07 DIAGNOSIS — E042 Nontoxic multinodular goiter: Secondary | ICD-10-CM | POA: Diagnosis not present

## 2023-08-07 DIAGNOSIS — M5030 Other cervical disc degeneration, unspecified cervical region: Secondary | ICD-10-CM

## 2023-08-07 DIAGNOSIS — L989 Disorder of the skin and subcutaneous tissue, unspecified: Secondary | ICD-10-CM

## 2023-08-07 DIAGNOSIS — L291 Pruritus scroti: Secondary | ICD-10-CM

## 2023-08-07 DIAGNOSIS — I10 Essential (primary) hypertension: Secondary | ICD-10-CM | POA: Diagnosis not present

## 2023-08-07 LAB — POCT URINALYSIS DIP (PROADVANTAGE DEVICE)
Bilirubin, UA: NEGATIVE
Glucose, UA: NEGATIVE mg/dL
Ketones, POC UA: NEGATIVE mg/dL
Nitrite, UA: NEGATIVE
Protein Ur, POC: NEGATIVE mg/dL
Specific Gravity, Urine: 1.01
Urobilinogen, Ur: 0.2
pH, UA: 7 (ref 5.0–8.0)

## 2023-08-07 MED ORDER — NYSTATIN 100000 UNIT/GM EX POWD: 1.0000 | Freq: Three times a day (TID) | CUTANEOUS | 1 refills | Status: DC

## 2023-08-07 NOTE — Progress Notes (Signed)
Subjective:    Daniel [name] is a 77 y.o. male who presents for Preventative Services visit and chronic medical problems/med check visit.    Chief Complaint  Patient presents with   Annual Exam    Fasting CPE, will leave [facility] on his way out. Will get covid booster and HD flu at his local pharmacy. No new concerns.    Primary Care Provider Laurens Matheny, Kermit Balo, PA-C here for primary care   Current Health Care Team: Dr. Jene Every, orthopedics Dr. Nanetta Batty, cardiology [facility] dermatology, prior with Dr. Janalyn Harder, dermatology Dr. Sharrell Ku, GI prior, Dr. Yancey Flemings, GI 2021 Dentist, Dr. Dr. Cyndee Brightly in [place], Dr. Ronney Asters [facility]   Exercise Current exercise habits: daily, weight bearing and aerobic, calisthenics  Nutrition/Diet Current diet: healthy  Depression Screen    08/07/2023    9:39 AM  Depression screen PHQ 2/9  Decreased Interest 0  Down, Depressed, Hopeless 0  PHQ - 2 Score 0     Fall Risk Screen    08/07/2023    9:38 AM 08/04/2022   10:39 AM 07/01/2022    9:59 AM 10/08/2021    2:31 PM 06/24/2021    9:47 AM  Fall Risk   Falls in the past year? 0 0 0 0 0  Number falls in past yr: 0 0 0 0 0  Injury with Fall? 0 0 0 0 0  Risk for fall due to : No Fall Risks No Fall Risks Medication side effect No Fall Risks No Fall Risks  Follow up Falls evaluation completed Falls evaluation completed Falls evaluation completed;Education provided;Falls prevention discussed Falls evaluation completed Falls evaluation completed    Gait Assessment: Normal gait observed yes   Advanced directives Does patient have a Health Care Power of Attorney? No Does patient have a Living Will? No    Past Medical History:  Diagnosis Date   Abdominal pain    with coffee   Abdominal ultrasound, abnormal 12/09/09   fatty liver disease   Abnormal carotid ultrasound 03/01/07   R no ICA stenosis. Left 40-60% ICA stenosis, no significant plaques   Allergy    allergy  shots, cat allergy; Dr. Sidney Ace;   Arthritis    osteoarthritis back/spine   Basal cell carcinoma 06/15/2020   Left Breast (nodular)   Diverticulosis    Dyslipidemia    Dysplastic nevus 03/01/1993   slight, left back and mid lower back, no treatment   Dysplastic nevus 03/04/2004   moderate-marked, low mid back, excision   Dysplastic nevus 06/14/2010   mild-mod, left hip, wider shave   Fatty liver    H/O CT scan of abdomen 03/20/12   abdomen/pelvis - normal   H/O CT scan of chest 03/20/12   normal   H/O diagnostic ultrasound 03/06/11   Aortic ultrasound - no AAA identified   H/O echocardiogram 10/18/2011   transthoracic - mild concentric LVH, EF 55%, mild valvular aortic stenosis, LA mild to moderate dilation, otherwise normal echo; Dr. Alanda Amass   Heart murmur, aortic    cardiac eval 09/2011, Dr. Allyson Sabal [facility]), mild aortic stenosis   Hyperlipidemia    Hypertension    Inguinal hernia 2012   right   Normal cardiac stress test 03/02/07   Myoview, EF 65%; Dr. 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  Borden    Past Surgical History:  Procedure Laterality Date   COLONOSCOPY  10/21/2008   internal hemorrhoids, diverticulosis; repeat 10/2018; Dr. Yancey Flemings, [place]   HAND SURGERY  2004   left ganglion cyst removed    NOSE SURGERY  2005   obstruction, polyp and deviated septum; Dr. Gae Bon SURGERY  2002, 2008   bilat, RTC    Social History   Socioeconomic History   Marital status: Married    Spouse name: Not on file   Number of children: Not on file   Years of education: Not on file   Highest education level: Not on file  Occupational History   Occupation: Theatre stage manager; part time [employer]    Employer: [employer]  Tobacco Use   Smoking status: Never   Smokeless tobacco: Never  Vaping Use    Vaping status: Never Used  Substance and Sexual Activity   Alcohol use: Yes    Alcohol/week: 2.0 standard drinks of alcohol    Types: 2 Cans of beer per week    Comment: non alcoholic beer   Drug use: No   Sexual activity: Yes  Other Topics Concern   Not on file  Social History Narrative   Married, no children, 2 dogs and a cat, exercise - aerobics,weight bearing.  07/2023.   Social Determinants of Health   Financial Resource Strain: Low Risk  (08/07/2023)   Overall Financial Resource Strain (CARDIA)    Difficulty of Paying Living Expenses: Not very hard  Food Insecurity: No Food Insecurity (08/07/2023)   Hunger Vital Sign    Worried About Running Out of Food in the Last Year: Never true    Ran Out of Food in the Last Year: Never true  Transportation Needs: No Transportation Needs (08/07/2023)   PRAPARE - Administrator, Civil Service (Medical): No    Lack of Transportation (Non-Medical): No  Physical Activity: Insufficiently Active (08/07/2023)   Exercise Vital Sign    Days of Exercise per Week: 7 days    Minutes of Exercise per Session: 20 min  Stress: Stress Concern Present (08/07/2023)   [employer] of Occupational Health - Occupational Stress Questionnaire    Feeling of Stress : Very much  Social Connections: Moderately Integrated (08/07/2023)   Social Connection and Isolation Panel [NHANES]    Frequency of Communication with Friends and Family: Three times a week    Frequency of Social Gatherings with Friends and Family: Once a week    Attends Religious Services: More than 4 times per year    Active Member of [employer] or Organizations: No    Attends Banker Meetings: Never    Marital Status: Married  Catering manager Violence: Not At Risk (08/07/2023)   Humiliation, Afraid, Rape, and Kick questionnaire    Fear of Current or Ex-Partner: No    Emotionally Abused: No    Physically Abused: No    Sexually Abused: No    Family History  Problem Relation  Age of Onset   Cancer Mother        breast   Dementia Mother 53   Kidney disease Father 64       kidney failure   Rheum arthritis Father    Diabetes Neg Hx    Heart disease Neg Hx    Stroke Neg Hx    Hypertension Neg Hx    Hyperlipidemia Neg Hx    Colon cancer Neg Hx    Colon polyps Neg  Hx    Esophageal cancer Neg Hx    Rectal cancer Neg Hx    Stomach cancer Neg Hx      Current Outpatient Medications:    co-enzyme Q-10 30 MG capsule, Take 30 mg by mouth 3 (three) times daily., Disp: , Rfl:    ezetimibe (ZETIA) 10 MG tablet, TAKE 1 TABLET(10 MG) BY MOUTH DAILY, Disp: 90 tablet, Rfl: 1   metoprolol succinate (TOPROL-XL) 50 MG 24 hr tablet, TAKE 1/2 TABLET BY MOUTH EVERY MORNING AND 1 TABLET EVERY EVENING. TAKE WITH OR IMMEDIATELY FOLLOWING A MEAL, Disp: 135 tablet, Rfl: 3   nystatin (MYCOSTATIN/NYSTOP) powder, Apply 1 Application topically 3 (three) times daily., Disp: 15 g, Rfl: 1   rosuvastatin (CRESTOR) 10 MG tablet, TAKE 1 TABLET BY MOUTH EVERY OTHER DAY, Disp: 90 tablet, Rfl: 2   vitamin B-12 (CYANOCOBALAMIN) 100 MCG tablet, Take 100 mcg by mouth daily., Disp: , Rfl:    EPINEPHrine 0.3 mg/0.3 mL IJ SOAJ injection, Use as directed (Patient not taking: Reported on 03/24/2023), Disp: 1 each, Rfl: 1  Allergies  Allergen Reactions   Codeine Nausea Only and Other (See Comments)    Dizziness, sweat and will make patient pass out.   Lipitor [Atorvastatin]     Elevated LFTs, no symptoms   Sulfa Antibiotics Rash    History reviewed: allergies, current medications, past family history, past medical history, past social history, past surgical history and problem list  Chronic issues discussed: Dyslipidemia-compliant with Zetia 10mg  and statin Crestor 10mg  daily  Hypertension-compliant with Toprol-XL 50mg  daily  Chronic pain, history of radicular pain-he only takes Lyrica periodically  Recently had some discomfort in pubic region, though hair got tangled, and ultimately had some  tenderness of testicle but that resolved, but still now has weird hair tangly sensation.   Urine flow is ok.  No fever, no discharge.  Has hx/o cystoscopy per urology  In general gets sticky moist in scrotum even after a bath or shower   Objective:    Biometrics BP 120/74   Pulse (!) 54   Ht 5\' 10"  (1.778 m)   Wt 198 lb 3.2 oz (89.9 kg)   BMI 28.44 kg/m   Wt Readings from Last 3 Encounters:  08/07/23 198 lb 3.2 oz (89.9 kg)  03/24/23 207 lb 3.2 oz (94 kg)  02/07/23 210 lb (95.3 kg)    Gen: wd, wn, nad Skin: Anterior torso with several scattered small cherry hemangiomas benign-appearing, no worrisome lesions, other scattered macules non worrisome appearing HEENT: normocephalic, sclerae anicteric, TMs pearly, nares patent, no discharge or erythema, pharynx normal Oral cavity: MMM, no lesions, teeth in good repair Neck: supple, no lymphadenopathy, no thyromegaly, no masses, no bruits Heart: 2/6 brief holosystolic murmur upper left and right border, otherwise RRR, normal S1, S2 Lungs: CTA bilaterally, no wheezes, rhonchi, or rales Abdomen: +bs, soft, non tender, non distended, no masses, no hepatomegaly, no splenomegaly Musculoskeletal: arms and legs nontender, no swelling, no obvious deformity Extremities: no edema, no cyanosis, no clubbing Pulses: 2+ symmetric, upper and lower extremities, normal cap refill Neurological: alert, oriented x 3, CN2-12 intact, strength normal upper extremities and lower extremities, sensation normal throughout, DTRs 2+ throughout, no cerebellar signs, gait normal Psychiatric: normal affect, behavior normal, pleasant  GU-there is a small 2 to 3 mm roundish healing abrasion in the left pubic region, there is a slight darker coloration of the scrotum suggestive of possible tinea, otherwise GU normal male, circumcised, no mass or hernia or lymphadenopathy Rectal -  deferred     Assessment:   Encounter Diagnoses  Name Primary?   Encounter for health  maintenance examination in adult Yes   Advanced directives, counseling/discussion    Atherosclerosis of both carotid arteries    DDD (degenerative disc disease), cervical    Dyslipidemia    Essential hypertension, benign    Impaired fasting blood sugar    Multinodular thyroid    Vaccine counseling    Stage 3a chronic kidney disease (HCC)    Spinal stenosis of lumbar region, unspecified whether neurogenic claudication present    Neuropathy of lower extremity, unspecified laterality    Nonrheumatic aortic valve stenosis    Itchy scrotum    Skin lesion       Plan:    This visit was a preventative care visit, also known as wellness visit or routine physical.   Topics typically include healthy lifestyle, diet, exercise, preventative care, vaccinations, sick and well care, proper use of emergency dept and after hours care, as well as other concerns.     Recommendations: Continue to return yearly for your annual wellness and preventative care visits.  This gives [place] a chance to discuss healthy lifestyle, exercise, vaccinations, review your chart record, and perform screenings where appropriate.  I recommend you see your eye doctor yearly for routine vision care.  I recommend you see your dentist yearly for routine dental care including hygiene visits twice yearly.   Vaccination recommendations were reviewed Immunization History  Administered Date(s) Administered   HIB (PRP-OMP) 09/17/2018   Hepatitis B 05/17/1992, 12/17/1992, 06/16/2002   Influenza Split 09/08/2014, 08/29/2015   Influenza, High Dose Seasonal PF 09/01/2015, 09/13/2016, 09/13/2017, 09/17/2018, 08/28/2021   Influenza,inj,quad, With Preservative 08/13/2019   Influenza-Unspecified 09/05/2002, 09/02/2017, 09/17/2018   MMR 04/16/2012   PFIZER(Purple Top)SARS-COV-2 Vaccination 12/24/2019, 01/14/2020, 08/23/2020, 09/04/2020   PNEUMOCOCCAL CONJUGATE-20 05/13/2022   PPD Test 04/02/2012, 04/16/2012, 06/04/2013, 06/19/2013    Pfizer Covid-19 Vaccine Bivalent Booster 65yrs & up 08/15/2022   Pneumococcal Conjugate-13 11/19/2015, 08/19/2017   Pneumococcal Polysaccharide-23 03/14/2012, 09/13/2016, 08/25/2017, 08/15/2018, 09/24/2018, 09/23/2019, 09/04/2020, 09/03/2021   Pneumococcal-Unspecified 12/05/2001, 09/14/2018, 07/01/2019   Td 02/03/2003, 02/08/2021   Tdap 12/02/2009   Zoster Recombinant(Shingrix) 09/09/2017, 10/05/2017   Zoster, Live 09/16/2008   Declines flu shot today, wants to get it at the pharmacy   Screening for cancer: Colon cancer screening: I reviewed your colonoscopy on file that is up to date from 2021. No planned repeat per GI  Skin cancer screening: Check your skin regularly for new changes, growing lesions, or other lesions of concern Come in for evaluation if you have skin lesions of concern.  Lung cancer screening: If you have a greater than 20 pack year history of tobacco use, then you may qualify for lung cancer screening with a chest CT scan.   Please call your insurance company to inquire about coverage for this test.  We currently don't have screenings for other cancers besides breast, cervical, colon, and lung cancers.  If you have a strong family history of cancer or have other cancer screening concerns, please let me know.    Bone health: Get at least 150 minutes of aerobic exercise weekly Get weight bearing exercise at least once weekly Bone density test:  A bone density test is an imaging test that uses a type of X-ray to measure the amount of calcium and other minerals in your bones. The test may be used to diagnose or screen you for a condition that causes weak or thin bones (osteoporosis), predict  your risk for a broken bone (fracture), or determine how well your osteoporosis treatment is working. The bone density test is recommended for females 65 and older, or females or males <65 if certain risk factors such as thyroid disease, long term use of steroids such as for asthma  or rheumatological issues, vitamin D deficiency, estrogen deficiency, family history of osteoporosis, self or family history of fragility fracture in first degree relative.  Bone density normal 11/2021   Heart health: Get at least 150 minutes of aerobic exercise weekly Limit alcohol It is important to maintain a healthy blood pressure and healthy cholesterol numbers  Heart disease screening: Screening for heart disease includes screening for blood pressure, fasting lipids, glucose/diabetes screening, BMI height to weight ratio, reviewed of smoking status, physical activity, and diet.    Goals include blood pressure 120/80 or less, maintaining a healthy lipid/cholesterol profile, preventing diabetes or keeping diabetes numbers under good control, not smoking or using tobacco products, exercising most days per week or at least 150 minutes per week of exercise, and eating healthy variety of fruits and vegetables, healthy oils, and avoiding unhealthy food choices like fried food, fast food, high sugar and high cholesterol foods.    Continue routine follow up with your cardiologist    Medical care options: I recommend you continue to seek care here first for routine care.  We try really hard to have available appointments Monday through Friday daytime hours for sick visits, acute visits, and physicals.  Urgent care should be used for after hours and weekends for significant issues that cannot wait till the next day.  The emergency department should be used for significant potentially life-threatening emergencies.  The emergency department is expensive, can often have long wait times for less significant concerns, so try to utilize primary care, urgent care, or telemedicine when possible to avoid unnecessary trips to the emergency department.  Virtual visits and telemedicine have been introduced since the pandemic started in 2020, and can be convenient ways to receive medical care.  We offer virtual  appointments as well to assist you in a variety of options to seek medical care.   Advanced Directives: I recommend you consider completing a Health Care Power of Attorney and Living Will.   These documents respect your wishes and help alleviate burdens on your loved ones if you were to become terminally ill or be in a position to need those documents enforced.    You can complete Advanced Directives yourself, have them notarized, then have copies made for our office, for you and for anybody you feel should have them in safe keeping.  Or, you can have an attorney prepare these documents.   If you haven't updated your Last Will and Testament in a while, it may be worthwhile having an attorney prepare these documents together and save on some costs.       Separate significant issues discussed: Hyperlipidemia-continue current meds patient, labs today, Zetia 10 mg daily and Crestor 10 mg daily  Hypertension-continue current medication, Toprol-XL 50 mg, half tablet morning, whole tablet in the evening     Scrotum itching - begin Nystatin powder daily for the next 2-3 weeks  Skin lesion, appears to be healing abrasion of left pubic region - adivsed OTC neosporin daily for about 5 days then stop  Atherosclerosis of carotid arteries-consider updated ultrasound.  Last ultrasound 2021.  Aortic stenosis-I reviewed echocardiogram results from March 2024 from Dr. Herbie Baltimore, moderate aortic valve calcification with mild to moderate aortic  stenosis, moderately thickened ventricle left ventricle  CKD 3 -  routine labs today  Neuropathy, spinal stenosis-no recent complaints.   Joen was seen today for annual exam.  Diagnoses and all orders for this visit:  Encounter for health maintenance examination in adult -     Comprehensive metabolic panel -     CBC with Differential/Platelet -     Lipid panel -     POCT Urinalysis DIP (Proadvantage Device) -     Hemoglobin A1c -     VITAMIN D 25 Hydroxy  (Vit-D Deficiency, Fractures)  Advanced directives, counseling/discussion  Atherosclerosis of both carotid arteries  DDD (degenerative disc disease), cervical  Dyslipidemia -     Comprehensive metabolic panel -     Lipid panel  Essential hypertension, benign  Impaired fasting blood sugar -     Hemoglobin A1c  Multinodular thyroid  Vaccine counseling  Stage 3a chronic kidney disease (HCC) -     Comprehensive metabolic panel  Spinal stenosis of lumbar region, unspecified whether neurogenic claudication present  Neuropathy of lower extremity, unspecified laterality  Nonrheumatic aortic valve stenosis  Itchy scrotum  Skin lesion  Other orders -     nystatin (MYCOSTATIN/NYSTOP) powder; Apply 1 Application topically 3 (three) times daily.     F/u pending labs

## 2023-08-08 ENCOUNTER — Telehealth: Payer: Self-pay | Admitting: Medical

## 2023-08-08 LAB — CBC WITH DIFFERENTIAL/PLATELET
Basophils Absolute: 0.1 10*3/uL (ref 0.0–0.2)
Basos: 1 %
EOS (ABSOLUTE): 0.2 10*3/uL (ref 0.0–0.4)
Eos: 3 %
Hematocrit: 48.6 % (ref 37.5–51.0)
Hemoglobin: 16.1 g/dL (ref 13.0–17.7)
Immature Grans (Abs): 0 10*3/uL (ref 0.0–0.1)
Immature Granulocytes: 0 %
Lymphocytes Absolute: 1.9 10*3/uL (ref 0.7–3.1)
Lymphs: 32 %
MCH: 31 pg (ref 26.6–33.0)
MCHC: 33.1 g/dL (ref 31.5–35.7)
MCV: 94 fL (ref 79–97)
Monocytes Absolute: 0.6 10*3/uL (ref 0.1–0.9)
Monocytes: 11 %
Neutrophils Absolute: 3.1 10*3/uL (ref 1.4–7.0)
Neutrophils: 53 %
Platelets: 135 10*3/uL — ABNORMAL LOW (ref 150–450)
RBC: 5.2 x10E6/uL (ref 4.14–5.80)
RDW: 13.3 % (ref 11.6–15.4)
WBC: 5.9 10*3/uL (ref 3.4–10.8)

## 2023-08-08 LAB — COMPREHENSIVE METABOLIC PANEL
ALT: 31 IU/L (ref 0–44)
AST: 28 IU/L (ref 0–40)
Albumin: 4.7 g/dL (ref 3.8–4.8)
Alkaline Phosphatase: 59 IU/L (ref 44–121)
BUN/Creatinine Ratio: 14 (ref 10–24)
BUN: 18 mg/dL (ref 8–27)
Bilirubin Total: 0.5 mg/dL (ref 0.0–1.2)
CO2: 26 mmol/L (ref 20–29)
Calcium: 9.8 mg/dL (ref 8.6–10.2)
Chloride: 103 mmol/L (ref 96–106)
Creatinine, Ser: 1.31 mg/dL — ABNORMAL HIGH (ref 0.76–1.27)
Globulin, Total: 2.6 g/dL (ref 1.5–4.5)
Glucose: 100 mg/dL — ABNORMAL HIGH (ref 70–99)
Potassium: 4.3 mmol/L (ref 3.5–5.2)
Sodium: 142 mmol/L (ref 134–144)
Total Protein: 7.3 g/dL (ref 6.0–8.5)
eGFR: 57 mL/min/{1.73_m2} — ABNORMAL LOW (ref >59)

## 2023-08-08 LAB — HEMOGLOBIN A1C
Est. average glucose Bld gHb Est-mCnc: 114 mg/dL
Hgb A1c MFr Bld: 5.6 % (ref 4.8–5.6)

## 2023-08-08 LAB — LIPID PANEL
Chol/HDL Ratio: 2.9 ratio (ref 0.0–5.0)
Cholesterol, Total: 136 mg/dL (ref 100–199)
HDL: 47 mg/dL (ref >39)
LDL Chol Calc (NIH): 72 mg/dL (ref 0–99)
Triglycerides: 89 mg/dL (ref 0–149)
VLDL Cholesterol Cal: 17 mg/dL (ref 5–40)

## 2023-08-08 LAB — VITAMIN D 25 HYDROXY (VIT D DEFICIENCY, FRACTURES): Vit D, 25-Hydroxy: 44.7 ng/mL (ref 30.0–100.0)

## 2023-08-08 NOTE — Progress Notes (Signed)
Results sent through MyChart

## 2023-08-08 NOTE — Telephone Encounter (Signed)
Pt called and agrees to change his bp med to valsartan. He uses [facility] [phone] - [place], [place] - [address] AT [facility] [address] & [facility] [address]

## 2023-08-09 ENCOUNTER — Other Ambulatory Visit: Payer: Self-pay | Admitting: Medical

## 2023-08-09 ENCOUNTER — Other Ambulatory Visit: Payer: Self-pay | Admitting: Internal Medicine

## 2023-08-09 MED ORDER — METOPROLOL SUCCINATE ER 25 MG PO TB24: 12.5000 mg | ORAL_TABLET | Freq: Every day | ORAL | 1 refills | Status: DC

## 2023-08-09 MED ORDER — VALSARTAN 40 MG PO TABS: 40.0000 mg | ORAL_TABLET | Freq: Every day | ORAL | 1 refills | Status: DC

## 2023-08-09 NOTE — Telephone Encounter (Signed)
Pt was notified.  

## 2023-08-10 DIAGNOSIS — J301 Allergic rhinitis due to pollen: Secondary | ICD-10-CM | POA: Diagnosis not present

## 2023-08-10 DIAGNOSIS — J3081 Allergic rhinitis due to animal (cat) (dog) hair and dander: Secondary | ICD-10-CM | POA: Diagnosis not present

## 2023-08-10 DIAGNOSIS — J3089 Other allergic rhinitis: Secondary | ICD-10-CM | POA: Diagnosis not present

## 2023-08-18 ENCOUNTER — Telehealth: Payer: Self-pay | Admitting: Medical

## 2023-08-18 NOTE — Telephone Encounter (Signed)
Tymeek called and states since switching to valsartan, it has caused his bp numbers to be inconsistent, headaches, and a racing pulse. He says he takes it at night at dinner but he doesn't like the way it makes him feel and wants to go back to the metoprolol.

## 2023-08-19 ENCOUNTER — Telehealth: Payer: Self-pay | Admitting: Cardiology

## 2023-08-19 NOTE — Telephone Encounter (Signed)
   Patient saw PCP 1 week ago for annual visit and following his appt, his anti-hypertensive regimen was adjusted. Patient was started on Valsartan 40mg  and his Metoprolol Succinate was reduced to 12.5mg  from 50mg . After starting Valsartan, patient noted fluctuant BP, more rapid pulse, and headaches. He discontinued this but has noticed increasing BP over the last few days. Last night BP was as high as 191/101 mmHg and EMS was activated. BP had improved slightly by the time EMS examined patient and he was not brought to the ED. Today BP has remained elevated but not quite as high (patient took higher 25mg  dose of Metoprolol succinate this morning). BP most recently 161/96 mmHg. Per patient, he's still having headaches but is without chest pain. He did have some exertional fatigue and shortness of breath while working in the yard today, now better after resting. He also has some mild dizziness. I advised patient to take another 25mg  of Metoprolol Succinate tonight to get him back on the full 50mg  dose he was taking prior to med change. He should take a full 50mg  tomorrow morning. Patient advised to come to the ED with BP >190 systolic or if he develops chest pain, acute shortness of breath, or severe headache.  Perlie Gold, PA-C

## 2023-08-21 ENCOUNTER — Ambulatory Visit (INDEPENDENT_AMBULATORY_CARE_PROVIDER_SITE_OTHER): Payer: Medicare PPO | Admitting: Medical

## 2023-08-21 VITALS — BP 126/84 | HR 64 | Wt 201.2 lb

## 2023-08-21 DIAGNOSIS — I10 Essential (primary) hypertension: Secondary | ICD-10-CM | POA: Diagnosis not present

## 2023-08-21 DIAGNOSIS — N1831 Chronic kidney disease, stage 3a: Secondary | ICD-10-CM | POA: Diagnosis not present

## 2023-08-21 DIAGNOSIS — R002 Palpitations: Secondary | ICD-10-CM

## 2023-08-21 MED ORDER — METOPROLOL SUCCINATE ER 50 MG PO TB24: 50.0000 mg | ORAL_TABLET | Freq: Every day | ORAL | Status: DC

## 2023-08-21 NOTE — Progress Notes (Signed)
Subjective:  Daniel [name] is a 77 y.o. male who presents for Chief Complaint  Patient presents with   Hypertension    BP is high and heart rate was racing, since medication change. Stopped taking valsartan and switched to Toprol  BP was high on Saturday and had to call EMS but once he switched back to Toprol his symptoms started to go away..      Here for f/u on BP.     Prior to his recent visit here for well visit on  08/07/23, he notes he was actually taking Toprol 50mg  XL although chart records and triage at that time stated he was taking Toprol XL 50mg , 1/2 tablet daily.    At his recent visit given his renal function lab findings, we had offered to try a little different regimen by adding ARB Valsartan for renal protection along with a lower dose of toprol.    However, right away he started getting some palpitations.  He started monitoring BP and pulse multiple times per day.  He was seeing pulses 60-70s, BPs 130/80 and higher.   He also started getting headaches.  Handout calling EMS due to headaches and high blood pressure.  They observed him for a little while and then he fell a little better so they did not take him to the hospital  Last year had lost 10 lb or so, BP was running low and at one point was using only 25mg  Toprol XL daily.  However, was having some palpitations and after talking to cardiology, went back to Toprol 50mg  XL daily.    No other aggravating or relieving factors.    No other c/o.  Past Medical History:  Diagnosis Date   Abdominal pain    with coffee   Abdominal ultrasound, abnormal 12/09/09   fatty liver disease   Abnormal carotid ultrasound 03/01/07   R no ICA stenosis. 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 Sidney Ace;   Arthritis    osteoarthritis back/spine   Basal cell carcinoma 06/15/2020   Left Breast (nodular)   Diverticulosis    Dyslipidemia    Dysplastic nevus 03/01/1993   slight,  left back and mid lower back, no treatment   Dysplastic nevus 03/04/2004   moderate-marked, low mid back, excision   Dysplastic nevus 06/14/2010   mild-mod, left hip, wider shave   Fatty liver    H/O CT scan of abdomen 03/20/12   abdomen/pelvis - normal   H/O CT scan of chest 03/20/12   normal   H/O diagnostic ultrasound 03/06/11   Aortic ultrasound - no AAA identified   H/O echocardiogram 10/18/2011   transthoracic - mild concentric LVH, EF 55%, mild valvular aortic stenosis, LA mild to moderate dilation, otherwise normal echo; Dr. Alanda Amass   Heart murmur, aortic    cardiac eval 09/2011, Dr. Allyson Sabal [facility]), mild aortic stenosis   Hyperlipidemia    Hypertension    Inguinal hernia 2012   right   Normal cardiac stress test 03/02/07   Myoview, EF 65%; Dr. 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 Laverle Patter   Current Outpatient Medications on File Prior to Visit  Medication Sig Dispense Refill   co-enzyme Q-10 30 MG capsule Take 30 mg by mouth 3 (three) times daily.  EPINEPHrine 0.3 mg/0.3 mL IJ SOAJ injection Use as directed 1 each 1   ezetimibe (ZETIA) 10 MG tablet TAKE 1 TABLET(10 MG) BY MOUTH DAILY 90 tablet 1   nystatin (MYCOSTATIN/NYSTOP) powder Apply 1 Application topically 3 (three) times daily. 15 g 1   rosuvastatin (CRESTOR) 10 MG tablet TAKE 1 TABLET BY MOUTH EVERY OTHER DAY 90 tablet 2   vitamin B-12 (CYANOCOBALAMIN) 100 MCG tablet Take 100 mcg by mouth daily.     No current facility-administered medications on file prior to visit.     The following portions of the patient's history were reviewed and updated as appropriate: allergies, current medications, past family history, past medical history, past social history, past surgical history and problem list.  ROS Otherwise as in subjective above    Objective: BP 126/84    Pulse 64   Wt 201 lb 3.2 oz (91.3 kg)   BMI 28.87 kg/m   BP Readings from Last 3 Encounters:  08/21/23 126/84  08/07/23 120/74  03/24/23 128/78   General appearance: alert, no distress, well developed, well nourished Neck: supple, no lymphadenopathy, no thyromegaly, no masses Heart: RRR, normal S1, S2, no murmurs Lungs: CTA bilaterally, no wheezes, rhonchi, or rales Pulses: 2+ radial pulses, 2+ pedal pulses, normal cap refill Ext: no edema    Assessment: Encounter Diagnoses  Name Primary?   Palpitation Yes   Stage 3a chronic kidney disease (HCC)    Essential hypertension, benign      Plan: We discussed the concerns.  Prior to last visit he was doing absolutely fine without symptoms and taking Toprol-XL 50 mg daily.  I reiterated the reasons why we were going to offer alternative regimen to add an ARB to protect the kidney given his CKD.  However he apparently does not do as well with Toprol less than 50 mg.  Advised he does go back to what he was doing prior to last visit.  So stop the recently added losartan.  Go back to Toprol-XL 50 mg once daily.  I suspect he got anxious over the changes in that probably contributed to the panic and the blood pressure and the headaches.  Continue to hydrate well.  Royalty was seen today for hypertension.  Diagnoses and all orders for this visit:  Palpitation  Stage 3a chronic kidney disease (HCC)  Essential hypertension, benign  Other orders -     metoprolol succinate (TOPROL XL) 50 MG 24 hr tablet; Take 1 tablet (50 mg total) by mouth daily. Take with or immediately following a meal.    Follow up: prn

## 2023-08-21 NOTE — Telephone Encounter (Signed)
Pt came in today for this. Vincenza Hews spoke with patient

## 2023-08-23 DIAGNOSIS — B356 Tinea cruris: Secondary | ICD-10-CM | POA: Diagnosis not present

## 2023-08-23 DIAGNOSIS — R3121 Asymptomatic microscopic hematuria: Secondary | ICD-10-CM | POA: Diagnosis not present

## 2023-08-23 DIAGNOSIS — N400 Enlarged prostate without lower urinary tract symptoms: Secondary | ICD-10-CM | POA: Diagnosis not present

## 2023-08-24 DIAGNOSIS — J3081 Allergic rhinitis due to animal (cat) (dog) hair and dander: Secondary | ICD-10-CM | POA: Diagnosis not present

## 2023-08-24 DIAGNOSIS — J301 Allergic rhinitis due to pollen: Secondary | ICD-10-CM | POA: Diagnosis not present

## 2023-08-24 DIAGNOSIS — J3089 Other allergic rhinitis: Secondary | ICD-10-CM | POA: Diagnosis not present

## 2023-09-07 DIAGNOSIS — J3089 Other allergic rhinitis: Secondary | ICD-10-CM | POA: Diagnosis not present

## 2023-09-07 DIAGNOSIS — J301 Allergic rhinitis due to pollen: Secondary | ICD-10-CM | POA: Diagnosis not present

## 2023-09-07 DIAGNOSIS — J3081 Allergic rhinitis due to animal (cat) (dog) hair and dander: Secondary | ICD-10-CM | POA: Diagnosis not present

## 2023-09-15 DIAGNOSIS — J3089 Other allergic rhinitis: Secondary | ICD-10-CM | POA: Diagnosis not present

## 2023-09-15 DIAGNOSIS — H1045 Other chronic allergic conjunctivitis: Secondary | ICD-10-CM | POA: Diagnosis not present

## 2023-09-15 DIAGNOSIS — R052 Subacute cough: Secondary | ICD-10-CM | POA: Diagnosis not present

## 2023-09-20 ENCOUNTER — Ambulatory Visit: Payer: Medicare PPO | Admitting: Medical

## 2023-09-20 DIAGNOSIS — J3089 Other allergic rhinitis: Secondary | ICD-10-CM | POA: Diagnosis not present

## 2023-09-20 DIAGNOSIS — J3081 Allergic rhinitis due to animal (cat) (dog) hair and dander: Secondary | ICD-10-CM | POA: Diagnosis not present

## 2023-09-20 DIAGNOSIS — J301 Allergic rhinitis due to pollen: Secondary | ICD-10-CM | POA: Diagnosis not present

## 2023-09-22 ENCOUNTER — Ambulatory Visit (INDEPENDENT_AMBULATORY_CARE_PROVIDER_SITE_OTHER): Payer: Medicare PPO | Admitting: Medical

## 2023-09-22 ENCOUNTER — Encounter: Payer: Self-pay | Admitting: Medical

## 2023-09-22 VITALS — BP 128/82 | HR 53 | Temp 97.6°F | Wt 196.2 lb

## 2023-09-22 DIAGNOSIS — K5909 Other constipation: Secondary | ICD-10-CM

## 2023-09-22 DIAGNOSIS — R002 Palpitations: Secondary | ICD-10-CM

## 2023-09-22 DIAGNOSIS — E041 Nontoxic single thyroid nodule: Secondary | ICD-10-CM

## 2023-09-22 MED ORDER — LINACLOTIDE 72 MCG PO CAPS: 72.0000 ug | ORAL_CAPSULE | Freq: Every day | ORAL | 2 refills | Status: DC

## 2023-09-22 NOTE — Progress Notes (Signed)
Subjective:  Daniel [name] is a 77 y.o. male who presents for Chief Complaint  Patient presents with   Palpitations    Started 1 month ago. Notice when laying down, before sleep, waking up.      Here for concerns  He notes chronic constipation problems in the last several months.  He drinks tons of water to the point he gets up several times at night to urinate.  He is doing Metamucil.  He has tried various other things over-the-counter including Dulcolax and other fiber cereals.  He wants to try something else to help with bowel movements  He has a history of thyroid nodule, curious if we need to do an updated ultrasound.  No new findings but given his symptoms he read online about possible thyroid component of palpitations or constipation  Lately has been having some palpitations in the last month.  Sometimes feels skipped beats, symptoms stools last week.  He called cardiology a few weeks ago and they had him do 1/2 tablet of Toprol in the morning and a whole tablet at night instead of just 1 tablet daily.  That seemed to help but then he went back to once a day dosing.  Sometimes the palpitations worse at night.  No syncope, no chest pain, no shortness of breath or dizziness.  No other aggravating or relieving factors.    No other c/o.  Past Medical History:  Diagnosis Date   Abdominal pain    with coffee   Abdominal ultrasound, abnormal 12/09/09   fatty liver disease   Abnormal carotid ultrasound 03/01/07   R no ICA stenosis. 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 Sidney Ace;   Arthritis    osteoarthritis back/spine   Basal cell carcinoma 06/15/2020   Left Breast (nodular)   Diverticulosis    Dyslipidemia    Dysplastic nevus 03/01/1993   slight, left back and mid lower back, no treatment   Dysplastic nevus 03/04/2004   moderate-marked, low mid back, excision   Dysplastic nevus 06/14/2010   mild-mod, left hip, wider  shave   Fatty liver    H/O CT scan of abdomen 03/20/12   abdomen/pelvis - normal   H/O CT scan of chest 03/20/12   normal   H/O diagnostic ultrasound 03/06/11   Aortic ultrasound - no AAA identified   H/O echocardiogram 10/18/2011   transthoracic - mild concentric LVH, EF 55%, mild valvular aortic stenosis, LA mild to moderate dilation, otherwise normal echo; Dr. Alanda Amass   Heart murmur, aortic    cardiac eval 09/2011, Dr. Allyson Sabal [facility]), mild aortic stenosis   Hyperlipidemia    Hypertension    Inguinal hernia 2012   right   Normal cardiac stress test 03/02/07   Myoview, EF 65%; Dr. 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 Laverle Patter   Current Outpatient Medications on File Prior to Visit  Medication Sig Dispense Refill   co-enzyme Q-10 30 MG capsule Take 30 mg by mouth 3 (three) times daily.     ezetimibe (ZETIA) 10 MG tablet TAKE 1 TABLET(10 MG) BY MOUTH DAILY 90 tablet 1   metoprolol succinate (TOPROL XL) 50 MG 24 hr tablet Take 1 tablet (50 mg total) by mouth daily. Take with or immediately following a meal.  rosuvastatin (CRESTOR) 10 MG tablet TAKE 1 TABLET BY MOUTH EVERY OTHER DAY 90 tablet 2   vitamin B-12 (CYANOCOBALAMIN) 100 MCG tablet Take 100 mcg by mouth daily.     EPINEPHrine 0.3 mg/0.3 mL IJ SOAJ injection Use as directed (Patient not taking: Reported on 09/22/2023) 1 each 1   nystatin (MYCOSTATIN/NYSTOP) powder Apply 1 Application topically 3 (three) times daily. (Patient not taking: Reported on 09/22/2023) 15 g 1   No current facility-administered medications on file prior to visit.     The following portions of the patient's history were reviewed and updated as appropriate: allergies, current medications, past family history, past medical history, past social history, past surgical history and problem list.  ROS Otherwise  as in subjective above  Objective: BP 128/82   Pulse (!) 53   Temp 97.6 F (36.4 C) (Oral)   Wt 196 lb 3.2 oz (89 kg)   SpO2 98%   BMI 28.15 kg/m   General appearance: alert, no distress, well developed, well nourished Neck: supple, no lymphadenopathy, no thyromegaly, no masses Heart: 3/6 holosystolic murmur heard in upper sternal borders, RRR, normal S1, S2 Lungs: CTA bilaterally, no wheezes, rhonchi, or rales Pulses: 2+ radial pulses, 2+ pedal pulses, normal cap refill Ext: no edema   Assessment: Encounter Diagnoses  Name Primary?   Palpitation Yes   Thyroid nodule    Chronic constipation      Plan: Palpitations EKG reviewed today, no new findings Thyroid labs today.  I reviewed recent labs from his September 2024 visit here Continue Toprol-XL 50 mg daily, follow-up with cardiology Monday as planned Ask about event monitor testing   Thyroid nodule-updated thyroid labs today.  We discussed prior ultrasound.  Let me know if you want to consider repeat ultrasound as we discussed today.   Constipation Continue good hydration throughout the day, continue Metamucil fiber supplement Begin trial of Linzess 1 tablet daily 72 mcg daily If Linzess is too expensive or not covered by insurance let me know and I will have you try something called lactulose prescription     Damyn was seen today for palpitations.  Diagnoses and all orders for this visit:  Palpitation -     EKG 12-Lead  Thyroid nodule -     EKG 12-Lead -     TSH + free T4  Chronic constipation -     TSH + free T4  Other orders -     linaclotide (LINZESS) 72 MCG capsule; Take 1 capsule (72 mcg total) by mouth daily before breakfast.    Follow up: pending labs

## 2023-09-22 NOTE — Patient Instructions (Signed)
Palpitations EKG reviewed today, no new findings Thyroid labs today.  I reviewed recent labs from his September 2024 visit here Continue Toprol-XL 50 mg daily, follow-up with cardiology Monday as planned Ask about event monitor testing   Thyroid nodule-updated thyroid labs today.  We discussed prior ultrasound.  Let me know if you want to consider repeat ultrasound as we discussed today.   Constipation Continue good hydration throughout the day, continue Metamucil fiber supplement Begin trial of Linzess 1 tablet daily 72 mcg daily If Linzess is too expensive or not covered by insurance let me know and I will have you try something called lactulose prescription

## 2023-09-23 LAB — TSH+FREE T4
Free T4: 1.12 ng/dL (ref 0.82–1.77)
TSH: 1.6 u[IU]/mL (ref 0.450–4.500)

## 2023-09-23 NOTE — Progress Notes (Signed)
Results sent through MyChart

## 2023-09-24 NOTE — Progress Notes (Signed)
Cardiology Office Note:  .   Date:  09/25/2023  ID:  Daniel [name], DOB [birth year], MRN [id number] PCP: Genia Del  [facility] Providers Cardiologist:  Nanetta Batty, MD {    History of Present Illness: .   Daniel [name] is a 77 y.o. male with a past medical history of hypertension, hyperlipidemia, and aortic stenosis here for follow-up appointment.  He was last seen 11/16/2022.  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  Rare episodes of tachypalpitations.  Cardiovascular risk profile is positive for hypertension hyperlipidemia but otherwise benign.  No family history of heart disease.  Never had a heart attack or stroke and denies chest pain or shortness of breath.  Echo done 2008 which showed normal LV systolic function with an aortic valve area of 1.79 cm consistent with mild aortic stenosis.  Had a 25 pound weight loss and the result of diet and exercise.  He was able to come off some of his antihypertensive medications and was normotensive when he checked at home.  Does daily exercise.  Echo 02/04/2021 revealed normal LV size and function, grade 1 DD and mild aortic stenosis with valve area 1.66 cm.  Peak gradient of 22 mmHg.  He was last seen he continues to do well.  Very active and works out on his land and does push-ups at night.  Completely asymptomatic.  Most recent 2D echo 01/31/2022 revealed a slight decline in his aortic valve area from 1.66 cm down to 1.55 cm.  Today, he presents with heart palpitations that started over a year ago. The palpitations initially occurred after the patient lost weight through diet and exercise, and self-adjusted his metoprolol dosage due to low blood pressure readings. The palpitations resolved after resuming his regular metoprolol dosage but have recently returned. The patient also experienced a significant increase in blood pressure after switching from metoprolol to valsartan, which was prescribed due to  its kidney benefits. The patient has since returned to his regular metoprolol dosage and reports feeling fine, with good blood pressure control, but still experiences occasional heart palpitations, particularly at night and in the morning. We offered a monitor but patient denied at this time.  In addition to the heart palpitations, the patient reports constipation. Despite maintaining a consistent diet and activity level, the patient has been experiencing hard stools and difficulty passing them. He has been managing the constipation with dietary changes, including increased intake of soluble and insoluble fiber, and over-the-counter remedies. The patient reports some improvement in his bowel movements with these changes.  He will plan to get his echo in Dec when he has follow-up with Dr. Allyson Sabal.  Reports no shortness of breath nor dyspnea on exertion. Reports no chest pain, pressure, or tightness. No edema, orthopnea, PND.     ROS: Pertinent ROS in HPI  Studies Reviewed: .       Echo 01/31/2023 IMPRESSIONS     1. Left ventricular ejection fraction, by estimation, is 65 to 70%. The  left ventricle has hyperdynamic function. The left ventricle has no  regional wall motion abnormalities. There is moderate concentric left  ventricular hypertrophy. Left ventricular  diastolic parameters were normal. The average left ventricular global  longitudinal strain is -19.9 %. The global longitudinal strain is normal.   2. Right ventricular systolic function is normal. The right ventricular  size is normal. There is normal pulmonary artery systolic pressure. The  estimated right ventricular systolic pressure is 23.6 mmHg.  3. Left atrial size was mildly dilated.   4. 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   5. The mitral valve is normal in structure. Trivial mitral valve  regurgitation.   6. The aortic valve is tricuspid. There is moderate  calcification of the  aortic valve. There is moderate thickening of the aortic valve. Aortic  valve regurgitation is mild. Moderate aortic valve stenosis. Aortic valve  mean gradient measures 16.0 mmHg.  Aortic valve Vmax measures 2.77 m/s. Aortic valve acceleration time  measures 63 msec.   7. The inferior vena cava is normal in size with greater than 50%  respiratory variability, suggesting right atrial pressure of 3 mmHg.   Comparison(s): No significant change from prior study. Prior images  reviewed side by side. There is a slight increase inaortic valve  gradients.       Physical Exam:   VS:  BP 126/72   Pulse (!) 54   Ht 5\' 10"  (1.778 m)   Wt 202 lb 9.6 oz (91.9 kg)   SpO2 96%   BMI 29.07 kg/m    Wt Readings from Last 3 Encounters:  09/25/23 202 lb 9.6 oz (91.9 kg)  09/22/23 196 lb 3.2 oz (89 kg)  08/21/23 201 lb 3.2 oz (91.3 kg)    GEN: Well nourished, well developed in no acute distress NECK: No JVD; No carotid bruits CARDIAC: RRR, +4/6 blowing systolic murmurs, rubs, gallops RESPIRATORY:  Clear to auscultation without rales, wheezing or rhonchi  ABDOMEN: Soft, non-tender, non-distended EXTREMITIES:  No edema; No deformity   ASSESSMENT AND PLAN: .    Moderate Aortic stenosis -Plan for echo in December -blowing 4/6 systolic murmur on exam  Palpitations Reported palpitations, more noticeable at rest and at night. No associated symptoms.  Normal thyroid function tests. -Continue metoprolol 50mg  daily as it seems to control symptoms and blood pressure effectively. -Consider repeat echocardiogram to assess aortic valve status December  Hypertension Well controlled on metoprolol 50mg  daily. Recent trial of valsartan led to increased blood pressure and palpitations. -Continue metoprolol 50mg  daily.  Stage 3 Chronic Kidney Disease Stable. Recent medication changes to valsartan for renal protection were not tolerated. -Continue to avoid NSAIDs. -Continue current  blood pressure management with metoprolol.  Constipation Chronic issue, not resolved with increased water and fiber intake. Limited options due to renal disease. -Continue current regimen of flax seed meal and increased water intake. -Consider adding healthy fats to diet  General Health Maintenance -Continue to limit caffeine intake. -Continue weight maintenance efforts.      Dispo: He will follow-up in December with Dr. Allyson Sabal  Signed, Sharlene Dory, PA-C

## 2023-09-25 ENCOUNTER — Encounter: Payer: Self-pay | Admitting: Physician Assistant

## 2023-09-25 ENCOUNTER — Ambulatory Visit: Payer: Medicare PPO | Attending: Physician Assistant | Admitting: Physician Assistant

## 2023-09-25 VITALS — BP 126/72 | HR 54 | Ht 70.0 in | Wt 202.6 lb

## 2023-09-25 DIAGNOSIS — I350 Nonrheumatic aortic (valve) stenosis: Secondary | ICD-10-CM

## 2023-09-25 DIAGNOSIS — E785 Hyperlipidemia, unspecified: Secondary | ICD-10-CM | POA: Diagnosis not present

## 2023-09-25 DIAGNOSIS — I10 Essential (primary) hypertension: Secondary | ICD-10-CM | POA: Diagnosis not present

## 2023-09-25 NOTE — Patient Instructions (Signed)
Medication Instructions:  You may take an extra 1/2 tablet of Metoprolol as needed for palpitations   *If you need a refill on your cardiac medications before your next appointment, please call your pharmacy*   Lab Work: None ordered   If you have labs (blood work) drawn today and your tests are completely normal, you will receive your results only by: MyChart Message (if you have MyChart) OR A paper copy in the mail If you have any lab test that is abnormal or we need to change your treatment, we will call you to review the results.   Testing/Procedures: None ordered    Follow-Up: Follow up as needed   Other Instructions Low-Sodium Eating Plan Salt (sodium) helps you keep a healthy balance of fluids in your body. Too much sodium can raise your blood pressure. It can also cause fluid and waste to be held in your body. Your health care provider or dietitian may recommend a low-sodium eating plan if you have high blood pressure (hypertension), kidney disease, liver disease, or heart failure. Eating less sodium can help lower your blood pressure and reduce swelling. It can also protect your heart, liver, and kidneys. What are tips for following this plan? Reading food labels  Check food labels for the amount of sodium per serving. If you eat more than one serving, you must multiply the listed amount by the number of servings. Choose foods with less than 140 milligrams (mg) of sodium per serving. Avoid foods with 300 mg of sodium or more per serving. Always check how much sodium is in a product, even if the label says "unsalted" or "no salt added." Shopping  Buy products labeled as "low-sodium" or "no salt added." Buy fresh foods. Avoid canned foods and pre-made or frozen meals. Avoid canned, cured, or processed meats. Buy breads that have less than 80 mg of sodium per slice. Cooking  Eat more home-cooked food. Try to eat less restaurant, buffet, and fast food. Try not to add  salt when you cook. Use salt-free seasonings or herbs instead of table salt or sea salt. Check with your provider or pharmacist before using salt substitutes. Cook with plant-based oils, such as canola, sunflower, or olive oil. Meal planning When eating at a restaurant, ask if your food can be made with less salt or no salt. Avoid dishes labeled as brined, pickled, cured, or smoked. Avoid dishes made with soy sauce, miso, or teriyaki sauce. Avoid foods that have monosodium glutamate (MSG) in them. MSG may be added to some restaurant food, sauces, soups, bouillon, and canned foods. Make meals that can be grilled, baked, poached, roasted, or steamed. These are often made with less sodium. General information Try to limit your sodium intake to 1,500-2,300 mg each day, or the amount told by your provider. What foods should I eat? Fruits Fresh, frozen, or canned fruit. Fruit juice. Vegetables Fresh or frozen vegetables. "No salt added" canned vegetables. "No salt added" tomato sauce and paste. Low-sodium or reduced-sodium tomato and vegetable juice. Grains Low-sodium cereals, such as oats, puffed wheat and rice, and shredded wheat. Low-sodium crackers. Unsalted rice. Unsalted pasta. Low-sodium bread. Whole grain breads and whole grain pasta. Meats and other proteins Fresh or frozen meat, poultry, seafood, and fish. These should have no added salt. Low-sodium canned tuna and salmon. Unsalted nuts. Dried peas, beans, and lentils without added salt. Unsalted canned beans. Eggs. Unsalted nut butters. Dairy Milk. Soy milk. Cheese that is naturally low in sodium, such as ricotta cheese,  fresh mozzarella, or Swiss cheese. Low-sodium or reduced-sodium cheese. Cream cheese. Yogurt. Seasonings and condiments Fresh and dried herbs and spices. Salt-free seasonings. Low-sodium mustard and ketchup. Sodium-free salad dressing. Sodium-free light mayonnaise. Fresh or refrigerated horseradish. Lemon juice.  Vinegar. Other foods Homemade, reduced-sodium, or low-sodium soups. Unsalted popcorn and pretzels. Low-salt or salt-free chips. The items listed above may not be all the foods and drinks you can have. Talk to a dietitian to learn more. What foods should I avoid? Vegetables Sauerkraut, pickled vegetables, and relishes. Olives. [place] fries. Onion rings. Regular canned vegetables, except low-sodium or reduced-sodium items. Regular canned tomato sauce and paste. Regular tomato and vegetable juice. Frozen vegetables in sauces. Grains Instant hot cereals. Bread stuffing, pancake, and biscuit mixes. Croutons. Seasoned rice or pasta mixes. Noodle soup cups. Boxed or frozen macaroni and cheese. Regular salted crackers. Self-rising flour. Meats and other proteins Meat or fish that is salted, canned, smoked, spiced, or pickled. Precooked or cured meat, such as sausages or meat loaves. [name]. Ham. Pepperoni. Hot dogs. Corned beef. Chipped beef. Salt pork. Jerky. Pickled herring, anchovies, and sardines. Regular canned tuna. Salted nuts. Dairy Processed cheese and cheese spreads. Hard cheeses. Cheese curds. Blue cheese. Feta cheese. String cheese. Regular cottage cheese. Buttermilk. Canned milk. Fats and oils Salted butter. Regular margarine. Ghee. Bacon fat. Seasonings and condiments Onion salt, garlic salt, seasoned salt, table salt, and sea salt. Canned and packaged gravies. Worcestershire sauce. Tartar sauce. Barbecue sauce. Teriyaki sauce. Soy sauce, including reduced-sodium soy sauce. Steak sauce. Fish sauce. Oyster sauce. Cocktail sauce. Horseradish that you find on the shelf. Regular ketchup and mustard. Meat flavorings and tenderizers. Bouillon cubes. Hot sauce. Pre-made or packaged marinades. Pre-made or packaged taco seasonings. Relishes. Regular salad dressings. Salsa. Other foods Salted popcorn and pretzels. Corn chips and puffs. Potato and tortilla chips. Canned or dried soups. Pizza. Frozen  entrees and pot pies. The items listed above may not be all the foods and drinks you should avoid. Talk to a dietitian to learn more. This information is not intended to replace advice given to you by your health care provider. Make sure you discuss any questions you have with your health care provider. Document Revised: 12/01/2022 Document Reviewed: 12/01/2022 Elsevier Patient Education  2024 Else[employer] Inc. Heart-Healthy Eating Plan Many factors influence your heart health, including eating and exercise habits. Heart health is also called coronary health. Coronary risk increases with abnormal blood fat (lipid) levels. A heart-healthy eating plan includes limiting unhealthy fats, increasing healthy fats, limiting salt (sodium) intake, and making other diet and lifestyle changes. What is my plan? Your health care provider may recommend that: You limit your fat intake to _________% or less of your total calories each day. You limit your saturated fat intake to _________% or less of your total calories each day. You limit the amount of cholesterol in your diet to less than _________ mg per day. You limit the amount of sodium in your diet to less than _________ mg per day. What are tips for following this plan? Cooking Cook foods using methods other than frying. Baking, boiling, grilling, and broiling are all good options. Other ways to reduce fat include: Removing the skin from poultry. Removing all visible fats from meats. Steaming vegetables in water or broth. Meal planning  At meals, imagine dividing your plate into fourths: Fill one-half of your plate with vegetables and green salads. Fill one-fourth of your plate with whole grains. Fill one-fourth of your plate with lean protein foods. Eat  2-4 cups of vegetables per day. One cup of vegetables equals 1 cup (91 g) broccoli or cauliflower florets, 2 medium carrots, 1 large bell pepper, 1 large sweet potato, 1 large tomato, 1 medium white  potato, 2 cups (150 g) raw leafy greens. Eat 1-2 cups of fruit per day. One cup of fruit equals 1 small apple, 1 large banana, 1 cup (237 g) mixed fruit, 1 large orange,  cup (82 g) dried fruit, 1 cup (240 mL) 100% fruit juice. Eat more foods that contain soluble fiber. Examples include apples, broccoli, carrots, beans, peas, and barley. Aim to get 25-30 g of fiber per day. Increase your consumption of legumes, nuts, and seeds to 4-5 servings per week. One serving of dried beans or legumes equals  cup (90 g) cooked, 1 serving of nuts is  oz (12 almonds, 24 pistachios, or 7 walnut halves), and 1 serving of seeds equals  oz (8 g). Fats Choose healthy fats more often. Choose monounsaturated and polyunsaturated fats, such as olive and canola oils, avocado oil, flaxseeds, walnuts, almonds, and seeds. Eat more omega-3 fats. Choose salmon, mackerel, sardines, tuna, flaxseed oil, and ground flaxseeds. Aim to eat fish at least 2 times each week. Check food labels carefully to identify foods with trans fats or high amounts of saturated fat. Limit saturated fats. These are found in animal products, such as meats, butter, and cream. Plant sources of saturated fats include palm oil, palm kernel oil, and coconut oil. Avoid foods with partially hydrogenated oils in them. These contain trans fats. Examples are stick margarine, some tub margarines, cookies, crackers, and other baked goods. Avoid fried foods. General information Eat more home-cooked food and less restaurant, buffet, and fast food. Limit or avoid alcohol. Limit foods that are high in added sugar and simple starches such as foods made using white refined flour (white breads, pastries, sweets). Lose weight if you are overweight. Losing just 5-10% of your body weight can help your overall health and prevent diseases such as diabetes and heart disease. Monitor your sodium intake, especially if you have high blood pressure. Talk with your health care  provider about your sodium intake. Try to incorporate more vegetarian meals weekly. What foods should I eat? Fruits All fresh, canned (in natural juice), or frozen fruits. Vegetables Fresh or frozen vegetables (raw, steamed, roasted, or grilled). Green salads. Grains Most grains. Choose whole wheat and whole grains most of the time. Rice and pasta, including brown rice and pastas made with whole wheat. Meats and other proteins Lean, well-trimmed beef, veal, pork, and lamb. Chicken and [place] without skin. All fish and shellfish. Wild duck, rabbit, pheasant, and venison. Egg whites or low-cholesterol egg substitutes. Dried beans, peas, lentils, and tofu. Seeds and most nuts. Dairy Low-fat or nonfat cheeses, including ricotta and mozzarella. Skim or 1% milk (liquid, powdered, or evaporated). Buttermilk made with low-fat milk. Nonfat or low-fat yogurt. Fats and oils Non-hydrogenated (trans-free) margarines. Vegetable oils, including soybean, sesame, sunflower, olive, avocado, peanut, safflower, corn, canola, and cottonseed. Salad dressings or mayonnaise made with a vegetable oil. Beverages Water (mineral or sparkling). Coffee and tea. Unsweetened ice tea. Diet beverages. Sweets and desserts Sherbet, gelatin, and fruit ice. Small amounts of dark chocolate. Limit all sweets and desserts. Seasonings and condiments All seasonings and condiments. The items listed above may not be a complete list of foods and beverages you can eat. Contact a dietitian for more options. What foods should I avoid? Fruits Canned fruit in heavy syrup. Fruit  in cream or butter sauce. Fried fruit. Limit coconut. Vegetables Vegetables cooked in cheese, cream, or butter sauce. Fried vegetables. Grains Breads made with saturated or trans fats, oils, or whole milk. Croissants. Sweet rolls. Donuts. High-fat crackers, such as cheese crackers and chips. Meats and other proteins Fatty meats, such as hot dogs, ribs, sausage,  bacon, rib-eye roast or steak. High-fat deli meats, such as salami and bologna. Caviar. Domestic duck and goose. Organ meats, such as liver. Dairy Cream, sour cream, cream cheese, and creamed cottage cheese. Whole-milk cheeses. Whole or 2% milk (liquid, evaporated, or condensed). Whole buttermilk. Cream sauce or high-fat cheese sauce. Whole-milk yogurt. Fats and oils Meat fat, or shortening. Cocoa butter, hydrogenated oils, palm oil, coconut oil, palm kernel oil. Solid fats and shortenings, including bacon fat, salt pork, lard, and butter. Nondairy cream substitutes. Salad dressings with cheese or sour cream. Beverages Regular sodas and any drinks with added sugar. Sweets and desserts Frosting. Pudding. Cookies. Cakes. Pies. Milk chocolate or white chocolate. Buttered syrups. Full-fat ice cream or ice cream drinks. The items listed above may not be a complete list of foods and beverages to avoid. Contact a dietitian for more information. Summary Heart-healthy meal planning includes limiting unhealthy fats, increasing healthy fats, limiting salt (sodium) intake and making other diet and lifestyle changes. Lose weight if you are overweight. Losing just 5-10% of your body weight can help your overall health and prevent diseases such as diabetes and heart disease. Focus on eating a balance of foods, including fruits and vegetables, low-fat or nonfat dairy, lean protein, nuts and legumes, whole grains, and heart-healthy oils and fats. This information is not intended to replace advice given to you by your health care provider. Make sure you discuss any questions you have with your health care provider. Document Revised: 12/20/2021 Document Reviewed: 12/20/2021 Elsevier Patient Education  2024 [employer].

## 2023-09-28 ENCOUNTER — Other Ambulatory Visit (INDEPENDENT_AMBULATORY_CARE_PROVIDER_SITE_OTHER): Payer: Medicare PPO

## 2023-09-28 DIAGNOSIS — Z23 Encounter for immunization: Secondary | ICD-10-CM

## 2023-10-05 DIAGNOSIS — J301 Allergic rhinitis due to pollen: Secondary | ICD-10-CM | POA: Diagnosis not present

## 2023-10-05 DIAGNOSIS — J3089 Other allergic rhinitis: Secondary | ICD-10-CM | POA: Diagnosis not present

## 2023-10-05 DIAGNOSIS — J3081 Allergic rhinitis due to animal (cat) (dog) hair and dander: Secondary | ICD-10-CM | POA: Diagnosis not present

## 2023-10-18 DIAGNOSIS — J301 Allergic rhinitis due to pollen: Secondary | ICD-10-CM | POA: Diagnosis not present

## 2023-10-18 DIAGNOSIS — J3089 Other allergic rhinitis: Secondary | ICD-10-CM | POA: Diagnosis not present

## 2023-10-18 DIAGNOSIS — J3081 Allergic rhinitis due to animal (cat) (dog) hair and dander: Secondary | ICD-10-CM | POA: Diagnosis not present

## 2023-11-01 DIAGNOSIS — J3081 Allergic rhinitis due to animal (cat) (dog) hair and dander: Secondary | ICD-10-CM | POA: Diagnosis not present

## 2023-11-01 DIAGNOSIS — J3089 Other allergic rhinitis: Secondary | ICD-10-CM | POA: Diagnosis not present

## 2023-11-01 DIAGNOSIS — J301 Allergic rhinitis due to pollen: Secondary | ICD-10-CM | POA: Diagnosis not present

## 2023-11-15 ENCOUNTER — Other Ambulatory Visit: Payer: Self-pay | Admitting: Medical

## 2023-11-15 DIAGNOSIS — J3089 Other allergic rhinitis: Secondary | ICD-10-CM | POA: Diagnosis not present

## 2023-11-20 ENCOUNTER — Encounter: Payer: Self-pay | Admitting: Cardiovascular Disease

## 2023-11-20 ENCOUNTER — Ambulatory Visit: Payer: Medicare PPO | Attending: Cardiovascular Disease | Admitting: Cardiovascular Disease

## 2023-11-20 VITALS — BP 122/74 | HR 60 | Ht 71.0 in | Wt 208.2 lb

## 2023-11-20 DIAGNOSIS — E785 Hyperlipidemia, unspecified: Secondary | ICD-10-CM | POA: Diagnosis not present

## 2023-11-20 DIAGNOSIS — I10 Essential (primary) hypertension: Secondary | ICD-10-CM

## 2023-11-20 DIAGNOSIS — I350 Nonrheumatic aortic (valve) stenosis: Secondary | ICD-10-CM

## 2023-11-20 NOTE — Assessment & Plan Note (Signed)
History of dyslipidemia on rosuvastatin and Zetia with lipid profile performed 08/07/2023 revealing total cholesterol 136, LDL 72 and HDL 47.

## 2023-11-20 NOTE — Patient Instructions (Addendum)
Medication Instructions:  Your physician recommends that you continue on your current medications as directed. Please refer to the Current Medication list given to you today.  *If you need a refill on your cardiac medications before your next appointment, please call your pharmacy*   Testing/Procedures: Your physician has requested that you have an echocardiogram. Echocardiography is a painless test that uses sound waves to create images of your heart. It provides your doctor with information about the size and shape of your heart and how well your heart's chambers and valves are working. This procedure takes approximately one hour. There are no restrictions for this procedure. Please do NOT wear cologne, perfume, aftershave, or lotions (deodorant is allowed). Please arrive 15 minutes prior to your appointment time. **To do in March 2025**  Please note: We ask at that you not bring children with you during ultrasound (echo/ vascular) testing. Due to room size and safety concerns, children are not allowed in the ultrasound rooms during exams. Our front office staff cannot provide observation of children in our lobby area while testing is being conducted. An adult accompanying a patient to their appointment will only be allowed in the ultrasound room at the discretion of the ultrasound technician under special circumstances. We apologize for any inconvenience.    Follow-Up: At [facility], you and your health needs are our priority.  As part of our continuing mission to provide you with exceptional heart care, we have created designated Provider Care Teams.  These Care Teams include your primary Cardiologist (physician) and Advanced Practice Providers (APPs -  Physician Assistants and Nurse Practitioners) who all work together to provide you with the care you need, when you need it.  We recommend signing up for the patient portal called "MyChart".  Sign up information is provided on this  After Visit Summary.  MyChart is used to connect with patients for Virtual Visits (Telemedicine).  Patients are able to view lab/test results, encounter notes, upcoming appointments, etc.  Non-urgent messages can be sent to your provider as well.   To learn more about what you can do with MyChart, go to [email or web address].    Your next appointment:   12 month(s)  Provider:   Nanetta Batty, MD

## 2023-11-20 NOTE — Assessment & Plan Note (Signed)
History of moderate aortic stenosis with 2D echo performed 01/31/2023 revealing normal LV systolic function with a aortic valve area of 1.37 cm and a peak gradient of 30 mmHg.  This is mildly progressed.  Will recheck this month annual basis.  He is otherwise asymptomatic with this.

## 2023-11-20 NOTE — Progress Notes (Signed)
11/20/2023 Daniel [name]   01-02-1946  [id number]  Primary Physician Tysinger, Kermit Balo, PA-C Primary Cardiologist: Runell Gess MD [name]  HPI:  Daniel [name] is a 77 y.o.  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  He was referred by Dr. Kinnie Scales for cardiovascular evaluation because of an auscultated murmur. He saw Dr. Carolanne Grumbling remotely.  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 He has rare episodes of tachy palpitations. His cardiovascular risk factor profile is positive for hypertension and hyperlipidemia but otherwise is benign. There is no family history of heart disease. He has never had a heart attack or stroke and denies chest pain or shortness of breath. He had an echo done on 2008 that showed normal LV systolic function with an aortic valve area of 1.79 cm consistent with mild aortic stenosis.      He has lost 25 pounds as a result of diet and exercise.  He has since come off his antihypertensive medications and is normotensive at home when he checks.  He does do exercise on a daily basis.   His most recent 2D echocardiogram performed 02/04/2021 revealed normal LV size and function, grade 1 diastolic dysfunction and mild aortic stenosis with a valve area of 1.66 cm.  A peak gradient of 22 mmHg.  Since I saw him a year ago he continues to do well.  He still very active, works out on his land, does push-ups at night and is completely asymptomatic.  His most recent 2D echocardiogram performed 01/31/2023 revealed a slight slight progression of his aortic stenosis now with a valve area of 1.37 cm with a peak gradient of 30 mmHg.  Chest pain or shortness of breath.  Current Meds  Medication Sig   co-enzyme Q-10 30 MG capsule Take 30 mg by mouth daily.   EPINEPHrine 0.3 mg/0.3 mL IJ SOAJ injection Use as  directed   ezetimibe (ZETIA) 10 MG tablet TAKE 1 TABLET(10 MG) BY MOUTH DAILY   metoprolol succinate (TOPROL-XL) 50 MG 24 hr tablet Take 50 mg by mouth daily. May take a extra half tablet as needed palpitations   rosuvastatin (CRESTOR) 10 MG tablet TAKE 1 TABLET BY MOUTH EVERY OTHER DAY   vitamin B-12 (CYANOCOBALAMIN) 100 MCG tablet Take 100 mcg by mouth daily.     Allergies  Allergen Reactions   Codeine Nausea Only and Other (See Comments)    Dizziness, sweat and will make patient pass out.   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  07/2023.   Social Drivers of Corporate investment banker Strain: Low Risk  (08/07/2023)   Overall Financial Resource Strain (CARDIA)    Difficulty of Paying Living Expenses: Not very hard  Food Insecurity: No Food Insecurity (08/07/2023)   Hunger Vital Sign    Worried About Running Out of Food in the Last Year: Never true    Ran Out of Food in the Last Year: Never true  Transportation Needs: No Transportation Needs (08/07/2023)   PRAPARE - Administrator, Civil Service (Medical): No    Lack of Transportation (Non-Medical): No  Physical Activity:  Insufficiently Active (08/07/2023)   Exercise Vital Sign    Days of Exercise per Week: 7 days    Minutes of Exercise per Session: 20 min  Stress: Stress Concern Present (08/07/2023)   [employer] of Occupational Health - Occupational Stress Questionnaire    Feeling of Stress : Very much  Social Connections: Moderately Integrated (08/07/2023)   Social Connection and Isolation Panel [NHANES]    Frequency of Communication with Friends and Family: Three times a week    Frequency of Social Gatherings with Friends and Family: Once a week    Attends Religious Services: More than 4 times per year    Active Member of [employer] or Organizations: No    Attends Banker Meetings: Never    Marital Status: Married  Catering manager Violence: Not At Risk (08/07/2023)   Humiliation, Afraid, Rape, and Kick questionnaire    Fear of Current or Ex-Partner: No    Emotionally Abused: No    Physically Abused: No    Sexually Abused: No     Review of Systems: General: negative for chills, fever, night sweats or weight changes.  Cardiovascular: negative for chest pain, dyspnea on exertion, edema, orthopnea, palpitations, paroxysmal nocturnal dyspnea or shortness of breath Dermatological: negative for rash Respiratory: negative for cough or wheezing Urologic: negative for hematuria Abdominal: negative for nausea, vomiting, diarrhea, bright red blood per rectum, melena, or hematemesis Neurologic: negative for visual changes, syncope, or dizziness All other systems reviewed and are otherwise negative except as noted above.    Blood pressure 122/74, pulse 60, height 5\' 11"  (1.803 m), weight 208 lb 3.2 oz (94.4 kg), SpO2 98%.  General appearance: alert and no distress Neck: no adenopathy, no JVD, supple, symmetrical, trachea midline, thyroid not enlarged, symmetric, no tenderness/mass/nodules, and bilateral carotid bruits versus transmitted murmur Lungs: clear to auscultation bilaterally Heart: 2/6  outflow tract murmur consistent with aortic stenosis Extremities: extremities normal, atraumatic, no cyanosis or edema Pulses: 2+ and symmetric Skin: Skin color, texture, turgor normal. No rashes or lesions Neurologic: Grossly normal  EKG not performed today      ASSESSMENT AND PLAN:   Essential hypertension, benign History of essential hypertension with blood pressure measured today at 122/74.  He is on metoprolol.  Dyslipidemia History of dyslipidemia on rosuvastatin and Zetia with lipid profile performed 08/07/2023 revealing total cholesterol 136, LDL 72 and HDL 47.  Aortic stenosis History of moderate aortic stenosis with 2D echo performed 01/31/2023 revealing normal LV systolic function with a aortic valve area of 1.37 cm and a peak gradient of 30 mmHg.  This is mildly progressed.  Will recheck this month annual basis.  He is otherwise asymptomatic with this.  Runell Gess MD FACP,FACC,FAHA, [facility] 11/20/2023 10:50 AM

## 2023-11-20 NOTE — Assessment & Plan Note (Signed)
History of essential hypertension with blood pressure measured today at 122/74.  He is on metoprolol.

## 2023-11-23 DIAGNOSIS — R208 Other disturbances of skin sensation: Secondary | ICD-10-CM | POA: Diagnosis not present

## 2023-11-23 DIAGNOSIS — L2989 Other pruritus: Secondary | ICD-10-CM | POA: Diagnosis not present

## 2023-11-23 DIAGNOSIS — L820 Inflamed seborrheic keratosis: Secondary | ICD-10-CM | POA: Diagnosis not present

## 2023-11-23 DIAGNOSIS — L538 Other specified erythematous conditions: Secondary | ICD-10-CM | POA: Diagnosis not present

## 2023-11-23 DIAGNOSIS — D2322 Other benign neoplasm of skin of left ear and external auricular canal: Secondary | ICD-10-CM | POA: Diagnosis not present

## 2023-11-30 DIAGNOSIS — J3089 Other allergic rhinitis: Secondary | ICD-10-CM | POA: Diagnosis not present

## 2023-12-14 DIAGNOSIS — J3089 Other allergic rhinitis: Secondary | ICD-10-CM | POA: Diagnosis not present

## 2023-12-28 DIAGNOSIS — J3089 Other allergic rhinitis: Secondary | ICD-10-CM | POA: Diagnosis not present

## 2023-12-31 ENCOUNTER — Other Ambulatory Visit: Payer: Self-pay | Admitting: Medical

## 2023-12-31 DIAGNOSIS — E785 Hyperlipidemia, unspecified: Secondary | ICD-10-CM

## 2024-01-11 DIAGNOSIS — J3089 Other allergic rhinitis: Secondary | ICD-10-CM | POA: Diagnosis not present

## 2024-01-24 DIAGNOSIS — J301 Allergic rhinitis due to pollen: Secondary | ICD-10-CM | POA: Diagnosis not present

## 2024-01-24 DIAGNOSIS — J3081 Allergic rhinitis due to animal (cat) (dog) hair and dander: Secondary | ICD-10-CM | POA: Diagnosis not present

## 2024-01-24 DIAGNOSIS — J3089 Other allergic rhinitis: Secondary | ICD-10-CM | POA: Diagnosis not present

## 2024-01-27 DIAGNOSIS — Z9289 Personal history of other medical treatment: Secondary | ICD-10-CM

## 2024-01-27 HISTORY — DX: Personal history of other medical treatment: Z92.89

## 2024-01-29 ENCOUNTER — Ambulatory Visit (HOSPITAL_COMMUNITY)
Admission: RE | Admit: 2024-01-29 | Discharge: 2024-01-29 | Disposition: A | Discharge status: Home / Self Care | Payer: Medicare PPO | Source: Ambulatory Visit | Attending: Cardiovascular Disease | Admitting: Cardiovascular Disease

## 2024-01-29 DIAGNOSIS — I10 Essential (primary) hypertension: Secondary | ICD-10-CM

## 2024-01-29 DIAGNOSIS — E785 Hyperlipidemia, unspecified: Secondary | ICD-10-CM

## 2024-01-29 DIAGNOSIS — I350 Nonrheumatic aortic (valve) stenosis: Secondary | ICD-10-CM

## 2024-01-29 LAB — ECHOCARDIOGRAM COMPLETE
AR max vel: 0.88 cm2
AV Area VTI: 0.95 cm2
AV Area mean vel: 0.87 cm2
AV Mean grad: 16.4 mmHg
AV Peak grad: 29.3 mmHg
AV Vena cont: 0.28 cm
Ao pk vel: 2.71 m/s
Area-P 1/2: 3.5 cm2
MV M vel: 4.46 m/s
MV Peak grad: 79.6 mmHg
P 1/2 time: 890 ms
Radius: 0.42 cm
S' Lateral: 2.48 cm

## 2024-01-30 ENCOUNTER — Encounter: Payer: Self-pay | Admitting: *Deleted

## 2024-01-30 ENCOUNTER — Other Ambulatory Visit: Payer: Self-pay | Admitting: *Deleted

## 2024-01-30 DIAGNOSIS — I350 Nonrheumatic aortic (valve) stenosis: Secondary | ICD-10-CM

## 2024-02-08 DIAGNOSIS — J3081 Allergic rhinitis due to animal (cat) (dog) hair and dander: Secondary | ICD-10-CM | POA: Diagnosis not present

## 2024-02-08 DIAGNOSIS — J3089 Other allergic rhinitis: Secondary | ICD-10-CM | POA: Diagnosis not present

## 2024-02-08 DIAGNOSIS — J301 Allergic rhinitis due to pollen: Secondary | ICD-10-CM | POA: Diagnosis not present

## 2024-02-22 DIAGNOSIS — J3089 Other allergic rhinitis: Secondary | ICD-10-CM | POA: Diagnosis not present

## 2024-02-26 ENCOUNTER — Ambulatory Visit: Payer: Self-pay | Admitting: *Deleted

## 2024-02-26 NOTE — Telephone Encounter (Signed)
 Copied from CRM [id number]. Topic: Clinical - Red Word Triage >> Feb 26, 2024 11:03 AM Alessandra Bevels wrote: Red Word that prompted transfer to Nurse Triage: Patient  is calling to report that he is having some ankle swelling for a couple of month with no pain Reason for Disposition  MILD or MODERATE ankle swelling (e.g., can't move joint normally, can't do usual activities) (Exceptions: Itchy, localized swelling; swelling is chronic.)  Answer Assessment - Initial Assessment Questions 1. LOCATION: "Which ankle is swollen?" "Where is the swelling?"     I'm having ankle swelling.   I'm also having constipation.    I was seen a couple of months ago.   I'm taking fiber supplements.  I'm using Miralax.  Then I'll have regular BM then I don't go the next day.     Both ankles are swollen.    2. ONSET: "When did the swelling start?"     Several months.   No pain in ankles.  The whole foot gets so swollen sometimes.  It's both feet get so tight.   Left worse. 3. SWELLING: "How bad is the swelling?" Or, "How large is it?" (e.g., mild, moderate, severe; size of localized swelling)    - NONE: No joint swelling.   - LOCALIZED: Localized; small area of puffy or swollen skin (e.g., insect bite, skin irritation).   - MILD: Joint looks or feels mildly swollen or puffy.   - MODERATE: Swollen; interferes with normal activities (e.g., work or school); decreased range of movement; may be limping.   - SEVERE: Very swollen; can't move swollen joint at all; limping a lot or unable to walk.     Moderate 4. PAIN: "Is there any pain?" If Yes, ask: "How bad is it?" (Scale 1-10; or mild, moderate, severe)   - NONE (0): no pain.   - MILD (1-3): doesn't interfere with normal activities.    - MODERATE (4-7): interferes with normal activities (e.g., work or school) or awakens from sleep, limping.    - SEVERE (8-10): excruciating pain, unable to do any normal activities, unable to walk.      Sometimes my left foot is red. 5.  CAUSE: "What do you think caused the ankle swelling?"     I don't know 6. OTHER SYMPTOMS: "Do you have any other symptoms?" (e.g., fever, chest pain, difficulty breathing, calf pain)     No 7. PREGNANCY: "Is there any chance you are pregnant?" "When was your last menstrual period?"     N/A  Protocols used: Ankle Swelling-A-AH  Chief Complaint: Bilateral ankle swelling for the last several months.    Also constipation Symptoms: above Frequency: Ankle swelling for the last several months Pertinent Negatives: Patient denies N/A Disposition: [] ED /[] Urgent Care (no appt availability in office) / [x] Appointment(In office/virtual)/ []  [facility]/ [] Home Care/ [] Refused Recommended Disposition /[] [facility]/ []  Follow-up with PCP Additional Notes: Appt made for 02/27/2024 with Dr. Aleen Campi

## 2024-02-27 ENCOUNTER — Ambulatory Visit (INDEPENDENT_AMBULATORY_CARE_PROVIDER_SITE_OTHER): Admitting: Medical

## 2024-02-27 VITALS — BP 124/82 | HR 60

## 2024-02-27 DIAGNOSIS — K5909 Other constipation: Secondary | ICD-10-CM | POA: Diagnosis not present

## 2024-02-27 DIAGNOSIS — N1831 Chronic kidney disease, stage 3a: Secondary | ICD-10-CM | POA: Diagnosis not present

## 2024-02-27 DIAGNOSIS — R600 Localized edema: Secondary | ICD-10-CM | POA: Diagnosis not present

## 2024-02-27 DIAGNOSIS — E041 Nontoxic single thyroid nodule: Secondary | ICD-10-CM | POA: Diagnosis not present

## 2024-02-27 DIAGNOSIS — I8393 Asymptomatic varicose veins of bilateral lower extremities: Secondary | ICD-10-CM

## 2024-02-27 NOTE — Patient Instructions (Signed)
 HAPPY BIRTHDAY   Leg edema, varicose veins I recommend regular exercise, avoid prolonged sitting or standing Limit salt, check labels for salt amounts in your foods Eating out and junk food such as chips can certainly have more salt Consider wearing compression hose or compression socks today Hotter weather can also be associated with little worse swelling In the future if you have more swelling particularly with shortness of breath or chest discomfort, then follow-up right away No indication or finding today to suggest worse kidney or heart function   Chronic constipation Try to get at least 80 to 100 ounces of water daily Try to get good amount of fiber daily like you are doing Consider miralax or dulcolax every day or every other day, or consider periodic use of the Linzess we discussed Avoid heavy meals, lots of meat and cheese   Thyroid nodule I will place an order for updated ultrasound thyroid to expect a phone call about scheduling   Chronic Kidney Disease 3a - stable I reviewed labs from this past year Due to abnormal kidney function, and in order to protect your kidneys, I recommend you avoid medications that can harm the kidneys such as ibuprofen, Aleve, Advil, Motrin, Naprosyn, or prescription anti-inflammatories which are used for pain, inflammation, and arthritis.   You should avoid dehydration which can harm the kidneys. Avoid dehydration

## 2024-02-27 NOTE — Progress Notes (Signed)
 Subjective:  Daniel [name] is a 78 y.o. male who presents for Chief Complaint  Patient presents with   bilateral ankle swelling    Bilateral ankle swelling x years but recently its been getting worse. Left foot usually gets red verses the other foot Still having issues with constipation      Here for leg swelling and foot swelling.  Has had this a long time but lately socks leave a line ,and swelling has been worse.  On his birthday recently, he had carrot cake, ate the whole cake over the course of 3 days, and in retrospect he realized this had 3 times daily amount of recommended sodium.   Thinks this aggravated his swelling even more.    Still having a lot of constipation issues.   Sometimes only goes every 4 days.    Lately having large bulky stool, but can go days without BM.   Was afraid to take Linzess, but has been using some Dulcolax.  This helps some. But stool still hard  Worried about linzess and kidneys and relying on stool medications daily.  Not eating meat, fried foods, or lots of cheese.  Gets good amount of water daily.  Uses pears, celery, psyllium husk, other fiber, water.   No other aggravating or relieving factors.    No other c/o.  Past Medical History:  Diagnosis Date   Abdominal pain    with coffee   Abdominal ultrasound, abnormal 12/09/09   fatty liver disease   Abnormal carotid ultrasound 03/01/07   R no ICA stenosis. 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 Sidney Ace;   Arthritis    osteoarthritis back/spine   Basal cell carcinoma 06/15/2020   Left Breast (nodular)   Diverticulosis    Dyslipidemia    Dysplastic nevus 03/01/1993   slight, left back and mid lower back, no treatment   Dysplastic nevus 03/04/2004   moderate-marked, low mid back, excision   Dysplastic nevus 06/14/2010   mild-mod, left hip, wider shave   Fatty liver    H/O CT scan of abdomen 03/20/12   abdomen/pelvis - normal    H/O CT scan of chest 03/20/12   normal   H/O diagnostic ultrasound 03/06/11   Aortic ultrasound - no AAA identified   H/O echocardiogram 10/18/2011   transthoracic - mild concentric LVH, EF 55%, mild valvular aortic stenosis, LA mild to moderate dilation, otherwise normal echo; Dr. Alanda Amass   Heart murmur, aortic    cardiac eval 09/2011, Dr. Allyson Sabal [facility]), mild aortic stenosis   Hyperlipidemia    Hypertension    Inguinal hernia 2012   right   Normal cardiac stress test 03/02/07   Myoview, EF 65%; Dr. 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 Laverle Patter   Current Outpatient Medications on File Prior to Visit  Medication Sig Dispense Refill   co-enzyme Q-10 30 MG capsule Take 30 mg by mouth daily.     ezetimibe (ZETIA) 10 MG tablet TAKE 1 TABLET(10 MG) BY MOUTH DAILY 90 tablet 1   metoprolol succinate (TOPROL-XL) 50 MG 24 hr tablet Take 50 mg by mouth daily. May take a extra half tablet as needed palpitations     rosuvastatin (CRESTOR) 10 MG tablet TAKE 1 TABLET BY MOUTH EVERY OTHER DAY  90 tablet 2   vitamin B-12 (CYANOCOBALAMIN) 100 MCG tablet Take 100 mcg by mouth daily.     EPINEPHrine 0.3 mg/0.3 mL IJ SOAJ injection Use as directed 1 each 1   No current facility-administered medications on file prior to visit.     The following portions of the patient's history were reviewed and updated as appropriate: allergies, current medications, past family history, past medical history, past social history, past surgical history and problem list.  ROS Otherwise as in subjective above     Objective: BP 124/82   Pulse 60   Wt Readings from Last 3 Encounters:  11/20/23 208 lb 3.2 oz (94.4 kg)  09/25/23 202 lb 9.6 oz (91.9 kg)  09/22/23 196 lb 3.2 oz (89 kg)   General appearance: alert, no distress, well developed, well nourished Neck: supple,  no lymphadenopathy, no thyromegaly, no masses, no JVD or bruits Heart: RRR, normal S1, S2, no murmurs Lungs: CTA bilaterally, no wheezes, rhonchi, or rales Pulses: 2+ radial pulses, 2+ pedal pulses, normal cap refill Ext: 1+ bilat LE nonpitting edema including lower legs, ankles and feet    Assessment: Encounter Diagnoses  Name Primary?   Leg edema Yes   Varicose veins of both lower extremities, unspecified whether complicated    Chronic constipation    Stage 3a chronic kidney disease (HCC)    Thyroid nodule      Plan: Leg edema, varicose veins I recommend regular exercise, avoid prolonged sitting or standing Limit salt, check labels for salt amounts in your foods Eating out and junk food such as chips can certainly have more salt Consider wearing compression hose or compression socks today Hotter weather can also be associated with little worse swelling In the future if you have more swelling particularly with shortness of breath or chest discomfort, then follow-up right away No indication or finding today to suggest worse kidney or heart function   Chronic constipation Try to get at least 80 to 100 ounces of water daily Try to get good amount of fiber daily like you are doing Consider miralax or dulcolax every day or every other day, or consider periodic use of the Linzess we discussed Avoid heavy meals, lots of meat and cheese   Thyroid nodule I will place an order for updated ultrasound thyroid to expect a phone call about scheduling   Chronic Kidney Disease 3a - stable I reviewed labs from this past year Due to abnormal kidney function, and in order to protect your kidneys, I recommend you avoid medications that can harm the kidneys such as ibuprofen, Aleve, Advil, Motrin, Naprosyn, or prescription anti-inflammatories which are used for pain, inflammation, and arthritis.   You should avoid dehydration which can harm the kidneys. Avoid dehydration   [name] was seen  today for bilateral ankle swelling.  Diagnoses and all orders for this visit:  Leg edema  Varicose veins of both lower extremities, unspecified whether complicated  Chronic constipation  Stage 3a chronic kidney disease (HCC)  Thyroid nodule -     US THYROID; Future    Follow up: pending [place]

## 2024-03-04 ENCOUNTER — Ambulatory Visit
Admission: RE | Admit: 2024-03-04 | Discharge: 2024-03-04 | Disposition: A | Discharge status: Home / Self Care | Source: Ambulatory Visit | Attending: Medical | Admitting: Medical

## 2024-03-04 DIAGNOSIS — E041 Nontoxic single thyroid nodule: Secondary | ICD-10-CM

## 2024-03-04 DIAGNOSIS — E042 Nontoxic multinodular goiter: Secondary | ICD-10-CM | POA: Diagnosis not present

## 2024-03-06 DIAGNOSIS — J3089 Other allergic rhinitis: Secondary | ICD-10-CM | POA: Diagnosis not present

## 2024-03-12 DIAGNOSIS — H61002 Unspecified perichondritis of left external ear: Secondary | ICD-10-CM | POA: Diagnosis not present

## 2024-03-12 DIAGNOSIS — R972 Elevated prostate specific antigen [PSA]: Secondary | ICD-10-CM | POA: Diagnosis not present

## 2024-03-12 DIAGNOSIS — J301 Allergic rhinitis due to pollen: Secondary | ICD-10-CM | POA: Diagnosis not present

## 2024-03-12 DIAGNOSIS — J3081 Allergic rhinitis due to animal (cat) (dog) hair and dander: Secondary | ICD-10-CM | POA: Diagnosis not present

## 2024-03-12 DIAGNOSIS — J3089 Other allergic rhinitis: Secondary | ICD-10-CM | POA: Diagnosis not present

## 2024-03-12 DIAGNOSIS — D492 Neoplasm of unspecified behavior of bone, soft tissue, and skin: Secondary | ICD-10-CM | POA: Diagnosis not present

## 2024-03-17 NOTE — Progress Notes (Signed)
 Thyroid  ultrasound shows a goiter, and a few specific nodules.  they do recommend biopsy of the right sided nodule.  If agreeable we can set up for FNA thyroid  nodule biopsy through interventional radiology

## 2024-03-18 DIAGNOSIS — J3089 Other allergic rhinitis: Secondary | ICD-10-CM | POA: Diagnosis not present

## 2024-03-19 ENCOUNTER — Other Ambulatory Visit: Payer: Self-pay | Admitting: *Deleted

## 2024-03-19 DIAGNOSIS — E041 Nontoxic single thyroid nodule: Secondary | ICD-10-CM

## 2024-03-19 DIAGNOSIS — R3121 Asymptomatic microscopic hematuria: Secondary | ICD-10-CM | POA: Diagnosis not present

## 2024-03-19 DIAGNOSIS — R972 Elevated prostate specific antigen [PSA]: Secondary | ICD-10-CM | POA: Diagnosis not present

## 2024-03-19 DIAGNOSIS — N400 Enlarged prostate without lower urinary tract symptoms: Secondary | ICD-10-CM | POA: Diagnosis not present

## 2024-03-28 ENCOUNTER — Other Ambulatory Visit: Payer: Self-pay | Admitting: Medical

## 2024-03-28 DIAGNOSIS — E785 Hyperlipidemia, unspecified: Secondary | ICD-10-CM

## 2024-03-28 NOTE — Telephone Encounter (Signed)
   Pt was given 6 month supply on 01/01/2024. Pt should not be out until August.

## 2024-04-03 DIAGNOSIS — J3089 Other allergic rhinitis: Secondary | ICD-10-CM | POA: Diagnosis not present

## 2024-04-06 DIAGNOSIS — M79642 Pain in left hand: Secondary | ICD-10-CM | POA: Diagnosis not present

## 2024-04-06 DIAGNOSIS — S60512A Abrasion of left hand, initial encounter: Secondary | ICD-10-CM | POA: Diagnosis not present

## 2024-04-06 DIAGNOSIS — M79602 Pain in left arm: Secondary | ICD-10-CM | POA: Diagnosis not present

## 2024-04-10 ENCOUNTER — Ambulatory Visit
Admission: RE | Admit: 2024-04-10 | Discharge: 2024-04-10 | Disposition: A | Discharge status: Home / Self Care | Source: Ambulatory Visit | Attending: Medical | Admitting: Medical

## 2024-04-10 ENCOUNTER — Other Ambulatory Visit (HOSPITAL_COMMUNITY)
Admission: RE | Admit: 2024-04-10 | Discharge: 2024-04-10 | Disposition: A | Discharge status: Home / Self Care | Source: Ambulatory Visit | Attending: Medical | Admitting: Medical

## 2024-04-10 DIAGNOSIS — E041 Nontoxic single thyroid nodule: Secondary | ICD-10-CM

## 2024-04-12 ENCOUNTER — Ambulatory Visit: Payer: Self-pay | Admitting: Medical

## 2024-04-12 LAB — CYTOLOGY - NON PAP

## 2024-04-12 NOTE — Progress Notes (Signed)
 It looks like there was enough tissue for accurate diagnosis.  Call radiology and see if radiology need for him to do this as a repeat or are they going to contact him to schedule repeat biopsy

## 2024-04-15 ENCOUNTER — Telehealth: Payer: Self-pay

## 2024-04-15 NOTE — Progress Notes (Signed)
 Okay let him know what they said and that we can do a yearly ultrasound surveillance

## 2024-04-15 NOTE — Telephone Encounter (Signed)
 See results.  Waiting on follow-up front [facility]

## 2024-04-15 NOTE — Telephone Encounter (Signed)
 I have already sent you a MyChart message from the pt. Regarding this.   Copied from CRM [id number]. Topic: Clinical - Lab/Test Results >> Apr 15, 2024  9:05 AM Daniel [name] wrote: Reason for CRM: Patient is following up on message he left for [facility] regarding imaging. Patient received a message that there was not enough tissue to get a diagnosis. Callback number is [phone].

## 2024-04-17 ENCOUNTER — Telehealth: Payer: Self-pay | Admitting: Medical

## 2024-04-17 NOTE — Telephone Encounter (Signed)
 Pt on list for Gaps in care for AWV & BP, pt has had appt with BP in range & sent message to [facility] to schedule an AWV

## 2024-04-19 DIAGNOSIS — J3089 Other allergic rhinitis: Secondary | ICD-10-CM | POA: Diagnosis not present

## 2024-04-19 DIAGNOSIS — J301 Allergic rhinitis due to pollen: Secondary | ICD-10-CM | POA: Diagnosis not present

## 2024-04-19 DIAGNOSIS — J3081 Allergic rhinitis due to animal (cat) (dog) hair and dander: Secondary | ICD-10-CM | POA: Diagnosis not present

## 2024-04-25 DIAGNOSIS — H5203 Hypermetropia, bilateral: Secondary | ICD-10-CM | POA: Diagnosis not present

## 2024-05-01 DIAGNOSIS — J3081 Allergic rhinitis due to animal (cat) (dog) hair and dander: Secondary | ICD-10-CM | POA: Diagnosis not present

## 2024-05-01 DIAGNOSIS — J301 Allergic rhinitis due to pollen: Secondary | ICD-10-CM | POA: Diagnosis not present

## 2024-05-01 DIAGNOSIS — J3089 Other allergic rhinitis: Secondary | ICD-10-CM | POA: Diagnosis not present

## 2024-05-06 ENCOUNTER — Other Ambulatory Visit: Payer: Self-pay | Admitting: Medical

## 2024-05-06 ENCOUNTER — Ambulatory Visit: Payer: Self-pay

## 2024-05-06 NOTE — Telephone Encounter (Unsigned)
 Copied from CRM [id number]. Topic: Clinical - Medication Refill >> May 06, 2024  1:57 PM Daniel [name] wrote: Medication: metoprolol  succinate (TOPROL -XL) 50 MG 24 hr tablet  Has the patient contacted their pharmacy? Yes  This is the patient's preferred pharmacy:  [id number] [place],  - [address] AT [facility] [address] [facility] [address] [facility] [address] [place] [address] Phone: [phone] Fax: [phone]  Is this the correct pharmacy for this prescription? Yes If no, delete pharmacy and type the correct one.   Has the prescription been filled recently? No  Is the patient out of the medication? No  Has the patient been seen for an appointment in the last year OR does the patient have an upcoming appointment? Yes  Can we respond through MyChart? Yes  Agent: Please be advised that Rx refills may take up to 3 business days. We ask that you follow-up with your pharmacy.

## 2024-05-06 NOTE — Telephone Encounter (Signed)
 FYI Only or Action Required?: FYI only for provider  Patient was last seen in primary care on 02/27/2024 by Claudene Crystal, PA-C. Called Nurse Triage reporting Constipation. Symptoms began several weeks ago. Interventions attempted: OTC medications: Miralax, Bisacodyl, Rest, hydration, or home remedies, and Dietary changes. Symptoms are: unchanged.  Triage Disposition: See PCP When Office is Open (Within 3 Days)  Patient/caregiver understands and will follow disposition?: Yes           Copied From CRM [id number]. Reason for Triage: Patient is having severe constipation and wants to know how to take care of it. He states he feels like he's full, like he has a basketball inside his body. He is wondering if his medications cause him to get constipated.   Reason for Disposition  Unable to have a bowel movement (BM) without laxative or enema  Answer Assessment - Initial Assessment Questions 1. STOOL PATTERN OR FREQUENCY: "How often do you have a bowel movement (BM)?"  (Normal range: 3 times a day to every 3 days)  "When was your last BM?"       X3 days, diarrhea over the weekend, constipated previously 2. STRAINING: "Do you have to strain to have a BM?"      Yes 3. RECTAL PAIN: "Does your rectum hurt when the stool comes out?" If Yes, ask: "Do you have hemorrhoids? How bad is the pain?"  (Scale 1-10; or mild, moderate, severe)     None 4. STOOL COMPOSITION: "Are the stools hard?"      "Most of the time it's like marbles" 5. BLOOD ON STOOLS: "Has there been any blood on the toilet tissue or on the surface of the BM?" If Yes, ask: "When was the last time?"     None 6. CHRONIC CONSTIPATION: "Is this a new problem for you?"  If No, ask: "How long have you had this problem?" (days, weeks, months)      No, intermittent, notes he had diarrhea on Saturday 7. CHANGES IN DIET OR HYDRATION: "Have there been any recent changes in your diet?" "How much fluids are you drinking on a daily basis?"  "How  much have you had to drink today?"     Pt increasing fluids 8. MEDICINES: "Have you been taking any new medicines?" "Are you taking any narcotic pain medicines?" (e.g., Dilaudid, morphine, Percocet, Vicodin)     None 9. LAXATIVES: "Have you been using any stool softeners, laxatives, or enemas?"  If Yes, ask "What, how often, and when was the last time?"     Miralax x 2 days, Bisacodyl  11. CAUSE: "What do you think is causing the constipation?"        Unknown 12. OTHER SYMPTOMS: "Do you have any other symptoms?" (e.g., abdomen pain, bloating, fever, vomiting)       Bloating 13. MEDICAL HISTORY: "Do you have a history of hemorrhoids, rectal fissures, or rectal surgery or rectal abscess?"         Colonoscopy 3-4 years ago  Protocols used: Constipation-A-AH

## 2024-05-07 ENCOUNTER — Other Ambulatory Visit: Payer: Self-pay | Admitting: Medical

## 2024-05-07 MED ORDER — METOPROLOL SUCCINATE ER 50 MG PO TB24: 50.0000 mg | ORAL_TABLET | Freq: Every day | ORAL | 1 refills | Status: DC

## 2024-05-08 DIAGNOSIS — J3081 Allergic rhinitis due to animal (cat) (dog) hair and dander: Secondary | ICD-10-CM | POA: Diagnosis not present

## 2024-05-08 DIAGNOSIS — J3089 Other allergic rhinitis: Secondary | ICD-10-CM | POA: Diagnosis not present

## 2024-05-08 DIAGNOSIS — J301 Allergic rhinitis due to pollen: Secondary | ICD-10-CM | POA: Diagnosis not present

## 2024-05-08 NOTE — Progress Notes (Signed)
 Chief Complaint  Patient presents with   Constipation    Has been issues with constipation for a few months. Used to have bowel movements very often. He is going maybe twice a week, just a butter bean or two each time. Drinking lots of water and taking fiber and miralax. Did have one good BM Monday- took miralax, senna tea and tucks suppository.    Triage note from 6/9-- Patient is having severe constipation and wants to know how to take care of it. He states he feels like he's full, like he has a basketball inside his body. He is wondering if his medications cause him to get constipated . Had diarrhea over the weekend, constipation prior to that.  Hard pebbles, straining. Used miralax x2d and bisacoydl (dulcolax).  Today--he felt pressure like he had to go. Sat on commode x 20 mins, read book, felt like he had an urge, sometimes would strain, got out a small pebble.  Doesn't feel distended or bloated currently.  He did feel like that a few days ago. Pants had felt tight.  Felt this way Tuesday; doesn't feel bloated or uncomfortable today, despite not having a good bowel movement. He felt better by cutting back on what he eats. He has been passing a lot of gas. Drinks almond milk.  He eats some cheese on salads. Cabbage, steamed broccoli. Black beans.  He uses miralax once or twice a week, only when he hasn't gone to the bathroom for a few days. He last used a suppository a week or so ago.  He switched to decaff coffee, concern for the diuretic effect making constipation worse. He retried regular coffee and it no longer works like it used to (used to help bowels) He walks daily.  He is asking if he can eat prunes. He likes them and they used to help, but he is concerned about the potassium, and his kidneys. He never has blood in the stools.  He has used dulcolax chews (Mg containing)--these work very well, but was afraid to use related to his kidneys. He has only used this once or  twice. He has also used dulcolax tablets (between 1-3, has gurgling and loose still with larger doses).  Sometimes he has a good BM after, and sometimes it does nothing (unsure of ingredient, just that it was safer for kidneys).  Chart reviewed-- He discussed constipation with Shane in 08/2023. At that time he reported drinking plenty of water (getting up several times/night to void) and was taking metamucil.  He reportedly tried dulcolax and fiber cereals. He prescribed Linzess  72 mcg daily, and advised to contact office if too expensive (to try something else, poss lactulose).  At his 4/1 visit, he reported ongoing constipation, sometimes having BM every 4 days.  He reported using some dulcolax, which helped some. He was afraid to take Linzess , worried about his kidneys and having to rely on stool medications daily. He was advised to consider miralax or dulcolax daily or every other day, or use of periodic Linzess .  Colonoscopy 10/2020 diverticulosis. No f/u recommended  Lab Results  Component Value Date   TSH 1.600 09/22/2023      PMH, PSH, SH reviewed HTN, atherosclerosis Multinodular thyroid   Outpatient Encounter Medications as of 05/09/2024  Medication Sig Note   co-enzyme Q-10 30 MG capsule Take 30 mg by mouth daily.    ezetimibe  (ZETIA ) 10 MG tablet TAKE 1 TABLET(10 MG) BY MOUTH DAILY    Glycerin, Laxative, (ADULT SUPPOSITORY RE) Place  1 each rectally as needed.    metoprolol  succinate (TOPROL -XL) 50 MG 24 hr tablet Take 1 tablet (50 mg total) by mouth daily. May take a extra half tablet as needed palpitations    Multiple Vitamins-Minerals (ONE-A-DAY MENS 50+ PO) Take 1 tablet by mouth daily.    NON FORMULARY 1 each as needed. 05/09/2024: Senna Tea - last dose Monday   polyethylene glycol powder (GLYCOLAX/MIRALAX) 17 GM/SCOOP powder Take 17 g by mouth 3 (three) times a week.    Probiotic Product (PRO-BIOTIC BLEND PO) Take 1 capsule by mouth daily. Nature's Bounty     rosuvastatin  (CRESTOR ) 10 MG tablet TAKE 1 TABLET BY MOUTH EVERY OTHER DAY    vitamin B-12 (CYANOCOBALAMIN) 100 MCG tablet Take 100 mcg by mouth daily.    [DISCONTINUED] polyethylene glycol powder (MIRALAX) 17 GM/SCOOP powder Take 17 g by mouth once.    EPINEPHrine  0.3 mg/0.3 mL IJ SOAJ injection Use as directed (Patient not taking: Reported on 05/09/2024) 08/07/2023: As needed   No facility-administered encounter medications on file as of 05/09/2024.   Allergies  Allergen Reactions   Codeine Nausea Only and Other (See Comments)    Dizziness, sweat and will make patient pass out.   Lipitor [Atorvastatin]     Elevated LFTs, no symptoms   Sulfa Antibiotics Rash    ROS: no fever, chills, URI symptoms, n/v.  Bowels per HPI. No urinary complaints. No HA, CP or other concerns.    PHYSICAL EXAM:  BP 130/82   Pulse (!) 56   Ht 5' 11 (1.803 m)   Wt 203 lb 3.2 oz (92.2 kg)   BMI 28.34 kg/m   Wt Readings from Last 3 Encounters:  05/09/24 203 lb 3.2 oz (92.2 kg)  11/20/23 208 lb 3.2 oz (94.4 kg)  09/25/23 202 lb 9.6 oz (91.9 kg)   Pleasant, talkative male in no distress HEENT: conjunctiva and sclera are clear, EOMI Heart: regular rate and rhythm, 3/6 SEM heard throughout Lungs: clear bilaterally Back: no spinal or CVA tenderness Abdomen:  active bowel sounds, soft, nontender, nondistended, no mass Neuro: alert and oriented, cranial nerves grossly intact, normal gait Psych: somewhat anxious, full range of affect, normal speech, grooming   ASSESSMENT/PLAN:  Chronic constipation - Encouraged regular use of Miralax, vs trial of Linzess  daily. Reassurred re: safety. Limit stimulant laxatives for now. Cont high fiber diet  Bloating - worse a few days ago, despite no good BM since. Suspect related to gas. Reviewed diet, Beano vs simethicone prn  Stage 3a chronic kidney disease (HCC) - labs reviewed eGFR 57, normal potassium. Linzess  and Miralax safe, prunes are fine to eat  I spent 35  minutes dedicated to the care of this patient, including pre-visit review of records, face to face time, post-visit ordering of testing and documentation.  I reviewed your kidney function, which is only slightly lower than normal.  You are not at risk for high potassium related to your kidneys. You do not have to be so careful regarding every medication (other than avoiding anti-inflammatories such ibuprofen, etc) or food regarding potassium. You may eat prunes daily to help with  your bowels, since you have reported this being helpful in the past. If this doesn't work, linzess  is perfectly safe for your kidneys to use it daily. If you prefer not to, or if you don't tolerate it, you can try using the miralax more regularly. Remember that miralax works slower than the dulocalax laxative, and needs more regular use.  I recommend  taking a full cap daily until you get good results (usually a few days in a row), and then back off on either the amount (full cap vs half), or the frequency (daily, vs every other day).  You adjust the amount or frequency of miralax that you take based on how often and how loose your stools are (we won't want you having diarrhea daily, or having BM's 4 times/day).  Some of your discomfort is from gas, not all from the constipation. I encourage you to consider either trying Bean-o prior to eating beans and other gas-producing foods, or using simethicone (ingredient in Gas-X) just when you feel bloated and gassy.

## 2024-05-09 ENCOUNTER — Ambulatory Visit: Admitting: Family Medicine

## 2024-05-09 ENCOUNTER — Encounter: Payer: Self-pay | Admitting: Family Medicine

## 2024-05-09 VITALS — BP 130/82 | HR 56 | Ht 71.0 in | Wt 203.2 lb

## 2024-05-09 DIAGNOSIS — R140 Abdominal distension (gaseous): Secondary | ICD-10-CM

## 2024-05-09 DIAGNOSIS — K5909 Other constipation: Secondary | ICD-10-CM

## 2024-05-09 DIAGNOSIS — N1831 Chronic kidney disease, stage 3a: Secondary | ICD-10-CM

## 2024-05-09 NOTE — Patient Instructions (Signed)
  I reviewed your kidney function, which is only slightly lower than normal.  You are not at risk for high potassium related to your kidneys. You do not have to be so careful regarding every medication (other than avoiding anti-inflammatories such ibuprofen, etc) or food regarding potassium. You may eat prunes daily to help with  your bowels, since you have reported this being helpful in the past. If this doesn't work, linzess  is perfectly safe for your kidneys to use it daily. If you prefer not to, or if you don't tolerate it, you can try using the miralax more regularly. Remember that miralax works slower than the dulocalax laxative, and needs more regular use.  I recommend taking a full cap daily until you get good results (usually a few days in a row), and then back off on either the amount (full cap vs half), or the frequency (daily, vs every other day).  You adjust the amount or frequency of miralax that you take based on how often and how loose your stools are (we won't want you having diarrhea daily, or having BM's 4 times/day).  Some of your discomfort is from gas, not all from the constipation. I encourage you to consider either trying Bean-o prior to eating beans and other gas-producing foods, or using simethicone (ingredient in Gas-X) just when you feel bloated and gassy.

## 2024-05-15 DIAGNOSIS — J3089 Other allergic rhinitis: Secondary | ICD-10-CM | POA: Diagnosis not present

## 2024-05-15 DIAGNOSIS — J301 Allergic rhinitis due to pollen: Secondary | ICD-10-CM | POA: Diagnosis not present

## 2024-05-15 DIAGNOSIS — J3081 Allergic rhinitis due to animal (cat) (dog) hair and dander: Secondary | ICD-10-CM | POA: Diagnosis not present

## 2024-05-30 DIAGNOSIS — J3089 Other allergic rhinitis: Secondary | ICD-10-CM | POA: Diagnosis not present

## 2024-05-30 DIAGNOSIS — J3081 Allergic rhinitis due to animal (cat) (dog) hair and dander: Secondary | ICD-10-CM | POA: Diagnosis not present

## 2024-05-30 DIAGNOSIS — J301 Allergic rhinitis due to pollen: Secondary | ICD-10-CM | POA: Diagnosis not present

## 2024-06-12 DIAGNOSIS — J3089 Other allergic rhinitis: Secondary | ICD-10-CM | POA: Diagnosis not present

## 2024-06-12 DIAGNOSIS — J301 Allergic rhinitis due to pollen: Secondary | ICD-10-CM | POA: Diagnosis not present

## 2024-06-12 DIAGNOSIS — J3081 Allergic rhinitis due to animal (cat) (dog) hair and dander: Secondary | ICD-10-CM | POA: Diagnosis not present

## 2024-06-13 DIAGNOSIS — K1329 Other disturbances of oral epithelium, including tongue: Secondary | ICD-10-CM | POA: Diagnosis not present

## 2024-06-14 DIAGNOSIS — K1329 Other disturbances of oral epithelium, including tongue: Secondary | ICD-10-CM | POA: Diagnosis not present

## 2024-06-25 ENCOUNTER — Other Ambulatory Visit: Payer: Self-pay | Admitting: Medical

## 2024-06-25 DIAGNOSIS — E785 Hyperlipidemia, unspecified: Secondary | ICD-10-CM

## 2024-06-27 DIAGNOSIS — J3081 Allergic rhinitis due to animal (cat) (dog) hair and dander: Secondary | ICD-10-CM | POA: Diagnosis not present

## 2024-06-27 DIAGNOSIS — J301 Allergic rhinitis due to pollen: Secondary | ICD-10-CM | POA: Diagnosis not present

## 2024-06-27 DIAGNOSIS — J3089 Other allergic rhinitis: Secondary | ICD-10-CM | POA: Diagnosis not present

## 2024-07-11 DIAGNOSIS — J3089 Other allergic rhinitis: Secondary | ICD-10-CM | POA: Diagnosis not present

## 2024-07-21 DIAGNOSIS — H6691 Otitis media, unspecified, right ear: Secondary | ICD-10-CM | POA: Diagnosis not present

## 2024-07-21 DIAGNOSIS — H6121 Impacted cerumen, right ear: Secondary | ICD-10-CM | POA: Diagnosis not present

## 2024-07-25 DIAGNOSIS — J3081 Allergic rhinitis due to animal (cat) (dog) hair and dander: Secondary | ICD-10-CM | POA: Diagnosis not present

## 2024-07-25 DIAGNOSIS — J3089 Other allergic rhinitis: Secondary | ICD-10-CM | POA: Diagnosis not present

## 2024-07-25 DIAGNOSIS — J301 Allergic rhinitis due to pollen: Secondary | ICD-10-CM | POA: Diagnosis not present

## 2024-07-26 ENCOUNTER — Ambulatory Visit: Payer: Self-pay

## 2024-07-26 ENCOUNTER — Telehealth: Payer: Self-pay | Admitting: Medical

## 2024-07-26 DIAGNOSIS — M5416 Radiculopathy, lumbar region: Secondary | ICD-10-CM

## 2024-07-26 MED ORDER — CYCLOBENZAPRINE HCL 5 MG PO TABS
ORAL_TABLET | ORAL | 0 refills | Status: AC
Start: 2024-07-26 — End: ?

## 2024-07-26 NOTE — Telephone Encounter (Signed)
 Patient stated he was prescribed Flexeril  in the past, but has not had it refilled it recently. Patient stated one bottle of Flexeril  has lasted 2 years because he does not take it everyday. Patient stated when he does take it, it really improves his symptoms. Advised patient to be seen in office, as medication is out of date. Patient declined and requested this RN to ask provider if he would be willing to refill medication. Patient stated he would be willing to come into the office eventually, but is seeking a small supply to help when symptoms flare-up. Please advise   No available openings today

## 2024-07-26 NOTE — Telephone Encounter (Signed)
 FYI Only or Action Required?: Action required by provider: medication refill request and clinical question for provider.  Patient was last seen in primary care on 05/09/2024 by Randol Dawes, MD.  Called Nurse Triage reporting Back Pain.  Symptoms began several years ago.  Interventions attempted: OTC medications: Tylenol and Rest, hydration, or home remedies.  Symptoms are: stable.  Triage Disposition: See PCP Within 2 Weeks  Patient/caregiver understands and will follow disposition?: No, refuses dispositionNo, refuses disposition                             Reason for Disposition  Back pain is a chronic symptom (recurrent or ongoing AND present > 4 weeks)  Answer Assessment - Initial Assessment Questions 1. ONSET: When did the pain begin? (e.g., minutes, hours, days)     10-15 years 2. LOCATION: Where does it hurt? (upper, mid or lower back)     Lumbar/pelvic region 3. SEVERITY: How bad is the pain?  (e.g., Scale 1-10; mild, moderate, or severe)     Varies, states he feels good today, but pain was severe yesterday 4. PATTERN: Is the pain constant? (e.g., yes, no; constant, intermittent)      States symptoms are intermittent  6. CAUSE:  What do you think is causing the back pain?      Chronic DDD and OA  8. MEDICINES: What have you taken so far for the pain? (e.g., nothing, acetaminophen, NSAIDS)     States he was taking NSAIDS for the pain and ended up with stage 3 kidney disease, takes Tylenol occasionally now 9. NEUROLOGIC SYMPTOMS: Do you have any weakness, numbness, or problems with bowel/bladder control?     Burning and tingling in feet 10. OTHER SYMPTOMS: Do you have any other symptoms? (e.g., fever, abdomen pain, burning with urination, blood in urine)     Pain causes back spasms    Patient stated he was prescribed Flexeril  in the past, but has not had it refilled it recently. Patient stated one bottle of Flexeril  has lasted 2  years because he does not take it everyday. Patient stated when he does take it, it really improves his symptoms. Advised patient to be seen in office, as medication is out of date. Patient declined and requested this RN to ask provider if he would be willing to refill medication. Patient stated he would be willing to come into the office eventually, but is seeking a small supply to help when symptoms flare-up. Please advise.  Protocols used: Back Pain-A-AH

## 2024-07-26 NOTE — Telephone Encounter (Signed)
 Patient requesting medication not on list and symptomatic. Closing refill encounter, please see nurse triage encounter.

## 2024-07-26 NOTE — Telephone Encounter (Signed)
 5mg  cyclobenzaprine  sent to the pharmacy for patient. Recommend f/u with PCP.

## 2024-07-26 NOTE — Telephone Encounter (Signed)
 1st attempt, patient's wife answered and states patient is outside doing yard work and states he is filthy and can't come to the phone right now. Wife took a message for patient to call back his [facility] for nurse triage.   Original call from patient this morning was for Flexeril  refill for spasms and the medication has not been prescribed since 2022 and is no longer a medication on his list.    Copied from CRM #[id number]. Topic: Clinical - Medication Refill >> Jul 26, 2024  9:17 AM [name] wrote: Medication: cyclobenzaprine  (FLEXERIL ) 10 MG tablet **discontinued but patient states he uses them  when his muscles cramp and would like a refill   Has the patient contacted their pharmacy? Yes, call dr   This is the patient's preferred pharmacy:  [facility] STORE #[id number] [place], [place] - [address] AT [facility] [address] [facility] [address] [facility] [address] [place] [address] Phone: [phone] Fax: [phone]   Is this the correct pharmacy for this prescription? Yes If no, delete pharmacy and type the correct one.    Has the prescription been filled recently? No   Is the patient out of the medication? No   Has the patient been seen for an appointment in the last year OR does the patient have an upcoming appointment? Yes   Can we respond through MyChart? Yes   Agent: Please be advised that Rx refills may take up to 3 business days. We ask that you follow-up with your pharmacy.

## 2024-07-26 NOTE — Telephone Encounter (Signed)
 Copied from CRM [id number]. Topic: Clinical - Medication Refill >> Jul 26, 2024  9:17 AM [name] wrote: Medication: cyclobenzaprine  (FLEXERIL ) 10 MG tablet **discontinued but patient states he uses them  when his muscles cramp and would like a refill  Has the patient contacted their pharmacy? Yes, call dr  This is the patient's preferred pharmacy:  [facility] STORE #[id number] [place], [place] - [address] AT [facility] [address] [facility] [address] & [facility] [address] [place] [address] Phone: [phone] Fax: [phone]  Is this the correct pharmacy for this prescription? Yes If no, delete pharmacy and type the correct one.   Has the prescription been filled recently? No  Is the patient out of the medication? No  Has the patient been seen for an appointment in the last year OR does the patient have an upcoming appointment? Yes  Can we respond through MyChart? Yes  Agent: Please be advised that Rx refills may take up to 3 business days. We ask that you follow-up with your pharmacy.

## 2024-07-30 NOTE — Telephone Encounter (Signed)
 Sched pt with [employer].

## 2024-07-31 ENCOUNTER — Ambulatory Visit (INDEPENDENT_AMBULATORY_CARE_PROVIDER_SITE_OTHER): Admitting: Medical

## 2024-07-31 VITALS — BP 120/80 | HR 55 | Wt 198.6 lb

## 2024-07-31 DIAGNOSIS — M62830 Muscle spasm of back: Secondary | ICD-10-CM | POA: Diagnosis not present

## 2024-07-31 DIAGNOSIS — R519 Headache, unspecified: Secondary | ICD-10-CM

## 2024-07-31 DIAGNOSIS — M47819 Spondylosis without myelopathy or radiculopathy, site unspecified: Secondary | ICD-10-CM | POA: Diagnosis not present

## 2024-07-31 DIAGNOSIS — M545 Low back pain, unspecified: Secondary | ICD-10-CM | POA: Diagnosis not present

## 2024-07-31 MED ORDER — CYCLOBENZAPRINE HCL 10 MG PO TABS: 5.0000 mg | ORAL_TABLET | Freq: Two times a day (BID) | ORAL | 0 refills | Status: AC | PRN

## 2024-07-31 NOTE — Progress Notes (Signed)
 Name: Daniel [name]   Date of Visit: 07/31/24   Date of last visit with me: 07/26/2024   CHIEF COMPLAINT:  Chief Complaint  Patient presents with   Medical Management of Chronic Issues    Med check. Needs refills on flexeril         HPI:  Discussed the use of AI scribe software for clinical note transcription with the patient, who gave verbal consent to proceed.  History of Present Illness Daniel [name] is a 78 year old male with osteoarthritis and lumbar spine degeneration who presents for a refill of cyclobenzaprine  for muscle spasms.  He experiences intermittent muscle spasms primarily in his lower back, exacerbated by activities such as bending over or washing the car. These spasms create a 'snowball' effect, where pain leads to more cramping, increasing the pain. He manages these episodes with cyclobenzaprine , taking half of a 10 mg tablet as needed, which effectively alleviates the spasms and aids sleep.  He reports a history of lumbar spine issues and recalls having an MRI in March 2018. He experiences burning and tingling in his feet when lying on his back, attributed to nerve pinching due to lumbar degeneration. To alleviate symptoms, he performs exercises, uses a massage chair, and sleeps on his side with pillows between his knees.  He maintains an active lifestyle, engaging in weightlifting and various exercises nightly, which he finds beneficial for his back. However, activities like pushing a shopping cart uphill can trigger back spasms, requiring him to take smaller steps to prevent cramping.  He reports a recent episode of sharp, stabbing pain on the right side of his neck, radiating up the side of his head, initially feared to be an ear infection. This was accompanied by sensitivity to touch on the affected side. He has a history of ear surgery for Meniere's disease, which sometimes results in wax buildup. A recent visit to urgent care revealed a wax blockage and a  minor infection, treated with amoxicillin .  He sleeps on a couch to prevent rolling onto his back, which exacerbates his symptoms. He has modified the couch with plywood for firmness and uses it to maintain a comfortable sleeping position.  No other aggravating or relieving factors. No other complaint.  Past Medical History:  Diagnosis Date   Abdominal pain    with coffee   Abdominal ultrasound, abnormal 12/09/09   fatty liver disease   Abnormal carotid ultrasound 03/01/07   R no ICA stenosis. 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 Signe Pottier;   Arthritis    osteoarthritis back/spine   Basal cell carcinoma 06/15/2020   Left Breast (nodular)   Diverticulosis    Dyslipidemia    Dysplastic nevus 03/01/1993   slight, left back and mid lower back, no treatment   Dysplastic nevus 03/04/2004   moderate-marked, low mid back, excision   Dysplastic nevus 06/14/2010   mild-mod, left hip, wider shave   Fatty liver    H/O CT scan of abdomen 03/20/12   abdomen/pelvis - normal   H/O CT scan of chest 03/20/12   normal   H/O diagnostic ultrasound 03/06/11   Aortic ultrasound - no AAA identified   H/O echocardiogram 10/18/2011   transthoracic - mild concentric LVH, EF 55%, mild valvular aortic stenosis, LA mild to moderate dilation, otherwise normal echo; Dr. Maye   Heart murmur, aortic    cardiac eval 09/2011, Dr. Court [facility]), mild aortic stenosis  Hyperlipidemia    Hypertension    Inguinal hernia 2012   right   Normal cardiac stress test 03/02/07   Myoview, EF 65%; Dr. Randine Bihari   SCC (squamous cell carcinoma) 09/17/2019   well diff, left sideburn, Cx3, 5FU   Statin intolerance    intolerance with Lipitor   Testicular pain 01/08/10   Urology eval, Dr. Renda   Current Outpatient Medications on File Prior to Visit  Medication Sig Dispense Refill   co-enzyme Q-10 30 MG capsule Take 30 mg by mouth daily.     cyclobenzaprine   (FLEXERIL ) 5 MG tablet May take 5-10mg  once a day as needed for back spasms. 20 tablet 0   EPINEPHrine  0.3 mg/0.3 mL IJ SOAJ injection Use as directed 1 each 1   ezetimibe  (ZETIA ) 10 MG tablet TAKE 1 TABLET(10 MG) BY MOUTH DAILY 90 tablet 1   metoprolol  succinate (TOPROL -XL) 50 MG 24 hr tablet Take 1 tablet (50 mg total) by mouth daily. May take a extra half tablet as needed palpitations 90 tablet 1   Multiple Vitamins-Minerals (ONE-A-DAY MENS 50+ PO) Take 1 tablet by mouth daily.     NON FORMULARY 1 each as needed.     polyethylene glycol powder (GLYCOLAX/MIRALAX) 17 GM/SCOOP powder Take 17 g by mouth 3 (three) times a week.     Probiotic Product (PRO-BIOTIC BLEND PO) Take 1 capsule by mouth daily. Nature's Bounty     rosuvastatin  (CRESTOR ) 10 MG tablet TAKE 1 TABLET BY MOUTH EVERY OTHER DAY 90 tablet 2   vitamin B-12 (CYANOCOBALAMIN) 100 MCG tablet Take 100 mcg by mouth daily.     Glycerin, Laxative, (ADULT SUPPOSITORY RE) Place 1 each rectally as needed.     No current facility-administered medications on file prior to visit.    Physical Exam HEENT unremarkable Normocephalic Neck with normal range of motion, nontender, no mass Nontender the face and head, no obvious abnormality of ear Back nontender but there is some spasm when he went to stand up and extend his back, range of motion with flexion about 90% of normal, normal extension, no other deformity Legs and hips nontender with normal range of motion Legs neurovascularly intact   Results RADIOLOGY Lumbar spine MRI: Mild to moderate disc herniation and bulging, facet joint arthropathy at the nerve root exit (01/29/2017)    Assessment and Plan Lumbar spondylosis with radiculopathy and muscle spasm of back 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 MRI from 2018 showed mild to moderate disc tearing and bulging, with arthritis around nerve root facets. Symptoms exacerbated by certain activities. Discussed  potential for worsening condition and option of updated imaging if symptoms progress. - Refill cyclobenzaprine  10 mg, take half to a whole tablet as needed. - Encourage continued physical therapy exercises. - Advise follow-up with orthopedic surgeon Dr. Baird at [facility] on October 12th. - Consider updated MRI if symptoms worsen or new symptoms develop.  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 Differential includes TMJ inflammation and trigeminal neuralgia.  No obvious ear infection or significant wax today.   Discussed potential causes and treatments. - Use cyclobenzaprine  as needed for muscle relaxation. - Use Tylenol or heat for pain management. - Report symptoms to orthopedic specialist during follow-up in case they feel C spine imaging may be helpful as well since he will be seeing them regarding back as well.  Iley was seen today for medical management of chronic  issues.  Diagnoses and all orders for this visit:  Back spasm  Bilateral low back pain without sciatica, unspecified chronicity  Facet arthropathy of spine  Right facial pain  Other orders -     cyclobenzaprine  (FLEXERIL ) 10 MG tablet; Take 0.5-1 tablets (5-10 mg total) by mouth 2 (two) times daily as needed for muscle spasms.    F/u with orthopedics as planned

## 2024-08-07 DIAGNOSIS — J3089 Other allergic rhinitis: Secondary | ICD-10-CM | POA: Diagnosis not present

## 2024-08-09 DIAGNOSIS — M545 Low back pain, unspecified: Secondary | ICD-10-CM | POA: Diagnosis not present

## 2024-08-21 DIAGNOSIS — J3081 Allergic rhinitis due to animal (cat) (dog) hair and dander: Secondary | ICD-10-CM | POA: Diagnosis not present

## 2024-08-21 DIAGNOSIS — J3089 Other allergic rhinitis: Secondary | ICD-10-CM | POA: Diagnosis not present

## 2024-08-21 DIAGNOSIS — J301 Allergic rhinitis due to pollen: Secondary | ICD-10-CM | POA: Diagnosis not present

## 2024-08-27 NOTE — Progress Notes (Signed)
   08/27/2024  Patient ID: Daniel [name], male   DOB: 22-Dec-1945, 78 y.o.   MRN: [id number]  Pharmacy Quality Measure Review  This patient is appearing on a report for being at risk of failing the adherence measure for cholesterol (statin) medications this calendar year.   Medication: Rosuvastatin  Last fill date: 07/17/24 for 90 day supply  Insurance report was not up to date. No action needed at this time.   Jon VEAR Lindau, PharmD Clinical Pharmacist [phone]

## 2024-09-04 DIAGNOSIS — J301 Allergic rhinitis due to pollen: Secondary | ICD-10-CM | POA: Diagnosis not present

## 2024-09-04 DIAGNOSIS — J3089 Other allergic rhinitis: Secondary | ICD-10-CM | POA: Diagnosis not present

## 2024-09-04 DIAGNOSIS — J3081 Allergic rhinitis due to animal (cat) (dog) hair and dander: Secondary | ICD-10-CM | POA: Diagnosis not present

## 2024-09-11 DIAGNOSIS — L570 Actinic keratosis: Secondary | ICD-10-CM | POA: Diagnosis not present

## 2024-09-11 DIAGNOSIS — L821 Other seborrheic keratosis: Secondary | ICD-10-CM | POA: Diagnosis not present

## 2024-09-13 DIAGNOSIS — Z79899 Other long term (current) drug therapy: Secondary | ICD-10-CM | POA: Diagnosis not present

## 2024-09-13 DIAGNOSIS — H1045 Other chronic allergic conjunctivitis: Secondary | ICD-10-CM | POA: Diagnosis not present

## 2024-09-13 DIAGNOSIS — J3089 Other allergic rhinitis: Secondary | ICD-10-CM | POA: Diagnosis not present

## 2024-09-13 DIAGNOSIS — J4520 Mild intermittent asthma, uncomplicated: Secondary | ICD-10-CM | POA: Diagnosis not present

## 2024-09-19 DIAGNOSIS — J3081 Allergic rhinitis due to animal (cat) (dog) hair and dander: Secondary | ICD-10-CM | POA: Diagnosis not present

## 2024-09-19 DIAGNOSIS — J3089 Other allergic rhinitis: Secondary | ICD-10-CM | POA: Diagnosis not present

## 2024-09-19 DIAGNOSIS — J301 Allergic rhinitis due to pollen: Secondary | ICD-10-CM | POA: Diagnosis not present

## 2024-09-25 DIAGNOSIS — L578 Other skin changes due to chronic exposure to nonionizing radiation: Secondary | ICD-10-CM | POA: Diagnosis not present

## 2024-09-25 DIAGNOSIS — L820 Inflamed seborrheic keratosis: Secondary | ICD-10-CM | POA: Diagnosis not present

## 2024-09-25 DIAGNOSIS — L2989 Other pruritus: Secondary | ICD-10-CM | POA: Diagnosis not present

## 2024-10-01 ENCOUNTER — Ambulatory Visit

## 2024-10-03 DIAGNOSIS — J3081 Allergic rhinitis due to animal (cat) (dog) hair and dander: Secondary | ICD-10-CM | POA: Diagnosis not present

## 2024-10-03 DIAGNOSIS — J301 Allergic rhinitis due to pollen: Secondary | ICD-10-CM | POA: Diagnosis not present

## 2024-10-03 DIAGNOSIS — J3089 Other allergic rhinitis: Secondary | ICD-10-CM | POA: Diagnosis not present

## 2024-10-08 ENCOUNTER — Ambulatory Visit: Payer: Self-pay

## 2024-10-08 VITALS — BP 128/80 | HR 60 | Temp 97.6°F | Ht 71.0 in | Wt 199.6 lb

## 2024-10-08 DIAGNOSIS — Z0000 Encounter for general adult medical examination without abnormal findings: Secondary | ICD-10-CM | POA: Diagnosis not present

## 2024-10-08 NOTE — Patient Instructions (Addendum)
 Daniel [name],  Thank you for taking the time for your Medicare Wellness Visit. I appreciate your continued commitment to your health goals. Please review the care plan we discussed, and feel free to reach out if I can assist you further.  Please note that Annual Wellness Visits do not include a physical exam. Some assessments may be limited, especially if the visit was conducted virtually. If needed, we may recommend an in-person follow-up with your provider.  Ongoing Care Seeing your primary care provider every 3 to 6 months helps us  monitor your health and provide consistent, personalized care.   Referrals If a referral was made during today's visit and you haven't received any updates within two weeks, please contact the referred provider directly to check on the status.  Recommended Screenings:  Health Maintenance  Topic Date Due   COVID-19 Vaccine (8 - Pfizer risk 2025-26 season) 02/26/2025   Medicare Annual Wellness Visit  10/08/2025   DTaP/Tdap/Td vaccine (4 - Td or Tdap) 02/09/2031   Pneumococcal Vaccine for age over 15  Completed   Flu Shot  Completed   Hepatitis C Screening  Completed   Zoster (Shingles) Vaccine  Completed   Meningitis B Vaccine  Aged Out   Hepatitis B Vaccine  Discontinued   Colon Cancer Screening  Discontinued       10/08/2024    9:35 AM  Advanced Directives  Does Patient Have a Medical Advance Directive? No  Would patient like information on creating a medical advance directive? No - Patient declined    Vision: Annual vision screenings are recommended for early detection of glaucoma, cataracts, and diabetic retinopathy. These exams can also reveal signs of chronic conditions such as diabetes and high blood pressure.  Dental: Annual dental screenings help detect early signs of oral cancer, gum disease, and other conditions linked to overall health, including heart disease and diabetes.  Please see the attached documents for additional preventive care  recommendations.

## 2024-10-08 NOTE — Progress Notes (Signed)
 Subjective:   Daniel [name] is a 78 y.o. male who presents for a Medicare Annual Wellness Visit.  Allergies (verified) Codeine, Lipitor [atorvastatin], and Sulfa antibiotics   History: Past Medical History:  Diagnosis Date   Abdominal pain    with coffee   Abdominal ultrasound, abnormal 12/09/09   fatty liver disease   Abnormal carotid ultrasound 03/01/07   R no ICA stenosis. 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 Signe Pottier;   Arthritis    osteoarthritis back/spine   Basal cell carcinoma 06/15/2020   Left Breast (nodular)   Diverticulosis    Dyslipidemia    Dysplastic nevus 03/01/1993   slight, left back and mid lower back, no treatment   Dysplastic nevus 03/04/2004   moderate-marked, low mid back, excision   Dysplastic nevus 06/14/2010   mild-mod, left hip, wider shave   Fatty liver    H/O CT scan of abdomen 03/20/12   abdomen/pelvis - normal   H/O CT scan of chest 03/20/12   normal   H/O diagnostic ultrasound 03/06/11   Aortic ultrasound - no AAA identified   H/O echocardiogram 10/18/2011   transthoracic - mild concentric LVH, EF 55%, mild valvular aortic stenosis, LA mild to moderate dilation, otherwise normal echo; Dr. Maye   Heart murmur, aortic    cardiac eval 09/2011, Dr. Court [facility]), mild aortic stenosis   Hyperlipidemia    Hypertension    Inguinal hernia 2012   right   Normal cardiac stress test 03/02/07   Myoview, EF 65%; Dr. Randine Bihari   SCC (squamous cell carcinoma) 09/17/2019   well diff, left sideburn, Cx3, 5FU   Statin intolerance    intolerance with Lipitor   Testicular pain 01/08/10   Urology eval, Dr. Renda   Past Surgical History:  Procedure Laterality Date   COLONOSCOPY  10/21/2008   internal hemorrhoids, diverticulosis; repeat 10/2018; Dr. Norleen Kiang, [place]   HAND SURGERY  2004   left ganglion cyst removed    NOSE SURGERY  2005   obstruction, polyp and deviated septum;  Dr. Carlie ROYS SURGERY  2002, 2008   bilat, RTC   Family History  Problem Relation Age of Onset   Cancer Mother        breast   Dementia Mother 54   Kidney disease Father 59       kidney failure   Rheum arthritis Father    Diabetes Neg Hx    Heart disease Neg Hx    Stroke Neg Hx    Hypertension Neg Hx    Hyperlipidemia Neg Hx    Colon cancer Neg Hx    Colon polyps Neg Hx    Esophageal cancer Neg Hx    Rectal cancer Neg Hx    Stomach cancer Neg Hx    Social History   Occupational History   Occupation: theatre stage manager; part time [employer]    Employer: [employer]  Tobacco Use   Smoking status: Never   Smokeless tobacco: Never  Vaping Use   Vaping status: Never Used  Substance and Sexual Activity   Alcohol use: Not Currently    Alcohol/week: 2.0 standard drinks of alcohol    Types: 2 Cans of beer per week    Comment: non alcoholic beer   Drug use: No   Sexual activity: Yes   Tobacco Counseling Counseling given: Not Answered  SDOH Screenings   Food Insecurity: No Food Insecurity (  10/08/2024)  Housing: Unknown (10/08/2024)  Transportation Needs: No Transportation Needs (10/08/2024)  Utilities: Not At Risk (10/08/2024)  Alcohol Screen: Low Risk  (10/08/2024)  Depression (PHQ2-9): Low Risk  (10/08/2024)  Financial Resource Strain: Low Risk  (10/08/2024)  Physical Activity: Insufficiently Active (10/08/2024)  Social Connections: Moderately Isolated (10/08/2024)  Stress: Stress Concern Present (10/08/2024)  Tobacco Use: Low Risk  (10/08/2024)  Health Literacy: Adequate Health Literacy (10/08/2024)   Depression Screen    10/08/2024    9:25 AM 08/07/2023    9:39 AM 07/01/2022    9:59 AM 10/08/2021    2:32 PM 06/24/2021    9:47 AM 03/19/2021   11:09 AM 03/09/2021    9:26 AM  PHQ 2/9 Scores  PHQ - 2 Score 0 0 0 0 0 0 0  PHQ- 9 Score 0  0          Data saved with a previous flowsheet row definition     Goals Addressed             This  Visit's Progress    Patient Stated       10/08/2024, remain reasonable weight and eat healthy       Visit info / Clinical Intake: Medicare Wellness Visit Type:: Subsequent Annual Wellness Visit Persons participating in visit:: patient Medicare Wellness Visit Mode:: In-person (required for WTM) Information given by:: patient Interpreter Needed?: No Pre-visit prep was completed: yes AWV questionnaire completed by patient prior to visit?: no Living arrangements:: lives with spouse/significant other Patient's Overall Health Status Rating: very good Typical amount of pain: some Does pain affect daily life?: (!) yes (sometimes) Are you currently prescribed opioids?: no  Dietary Habits and Nutritional Risks How many meals a day?: 3 Eats fruit and vegetables daily?: yes Most meals are obtained by: preparing own meals Diabetic:: no  Functional Status Activities of Daily Living (to include ambulation/medication): Independent Ambulation: Independent Medication Administration: Independent Home Management: Independent Manage your own finances?: yes Primary transportation is: driving Concerns about vision?: no *vision screening is required for WTM* Concerns about hearing?: no  Fall Screening Falls in the past year?: 0 Number of falls in past year: 0 Was there an injury with Fall?: 0 Fall Risk Category Calculator: 0 Patient Fall Risk Level: Low Fall Risk  Fall Risk Patient at Risk for Falls Due to: Medication side effect; Impaired balance/gait Fall risk Follow up: Falls prevention discussed; Falls evaluation completed  Home and Transportation Safety: All rugs have non-skid backing?: yes All stairs or steps have railings?: yes Grab bars in the bathtub or shower?: (!) no Have non-skid surface in bathtub or shower?: yes Good home lighting?: yes Regular seat belt use?: yes Hospital stays in the last year:: no  Cognitive Assessment Difficulty concentrating, remembering, or  making decisions? : no Will 6CIT or Mini Cog be Completed: yes What year is it?: 0 points What month is it?: 0 points Give patient an address phrase to remember (5 components): [address] About what time is it?: 0 points Count backwards from 20 to 1: 0 points Say the months of the year in reverse: 0 points Repeat the address phrase from earlier: 0 points 6 CIT Score: 0 points  Advance Directives (For Healthcare) Does Patient Have a Medical Advance Directive?: No Would patient like information on creating a medical advance directive?: No - Patient declined  Reviewed/Updated  Reviewed/Updated: Reviewed All (Medical, Surgical, Family, Medications, Allergies, Care Teams, Patient Goals)        Objective:  Today's Vitals   10/08/24 0920  BP: 128/80  Pulse: 60  Temp: 97.6 F (36.4 C)  TempSrc: Oral  SpO2: 99%  Weight: 199 lb 9.6 oz (90.5 kg)  Height: 5' 11 (1.803 m)   Body mass index is 27.84 kg/m.  Current Medications (verified) Outpatient Encounter Medications as of 10/08/2024  Medication Sig   co-enzyme Q-10 30 MG capsule Take 30 mg by mouth daily.   cyclobenzaprine  (FLEXERIL ) 10 MG tablet Take 0.5-1 tablets (5-10 mg total) by mouth 2 (two) times daily as needed for muscle spasms.   cyclobenzaprine  (FLEXERIL ) 5 MG tablet May take 5-10mg  once a day as needed for back spasms.   EPINEPHrine  0.3 mg/0.3 mL IJ SOAJ injection Use as directed   ezetimibe  (ZETIA ) 10 MG tablet TAKE 1 TABLET(10 MG) BY MOUTH DAILY   Glycerin, Laxative, (ADULT SUPPOSITORY RE) Place 1 each rectally as needed.   metoprolol  succinate (TOPROL -XL) 50 MG 24 hr tablet Take 1 tablet (50 mg total) by mouth daily. May take a extra half tablet as needed palpitations   Multiple Vitamins-Minerals (ONE-A-DAY MENS 50+ PO) Take 1 tablet by mouth daily.   polyethylene glycol powder (GLYCOLAX/MIRALAX) 17 GM/SCOOP powder Take 17 g by mouth 3 (three) times a week.   Probiotic Product (PRO-BIOTIC BLEND PO)  Take 1 capsule by mouth daily. Nature's Bounty   rosuvastatin  (CRESTOR ) 10 MG tablet TAKE 1 TABLET BY MOUTH EVERY OTHER DAY   vitamin B-12 (CYANOCOBALAMIN) 100 MCG tablet Take 100 mcg by mouth daily.   NON FORMULARY 1 each as needed. (Patient not taking: Reported on 10/08/2024)   No facility-administered encounter medications on file as of 10/08/2024.   Hearing/Vision screen Hearing Screening - Comments:: Denies hearing issues Vision Screening - Comments:: Regular eye exams, Dr. Glendia Immunizations and Health Maintenance Health Maintenance  Topic Date Due   COVID-19 Vaccine (8 - Pfizer risk 2025-26 season) 02/26/2025   Medicare Annual Wellness (AWV)  10/08/2025   DTaP/Tdap/Td (4 - Td or Tdap) 02/09/2031   Pneumococcal Vaccine: 50+ Years  Completed   Influenza Vaccine  Completed   Hepatitis C Screening  Completed   Zoster Vaccines- Shingrix  Completed   Meningococcal B Vaccine  Aged Out   Hepatitis B Vaccines 19-59 Average Risk  Discontinued   Colonoscopy  Discontinued        Assessment/Plan:  This is a routine wellness examination for Elsie.  Patient Care Team: Bulah Alm RAMAN, PA-C as PCP - General (Family Medicine) Court Dorn PARAS, MD as PCP - Cardiology (Cardiology) Luis Purchase, MD as Referring Physician (Gastroenterology) Court Dorn PARAS, MD as Attending Physician (Cardiology) Livingston Rigg, MD as Consulting Physician (Dermatology) Glendia Simmonds, OD as Referring Physician (Optometry)  I have personally reviewed and noted the following in the patient's chart:   Medical and social history Use of alcohol, tobacco or illicit drugs  Current medications and supplements including opioid prescriptions. Functional ability and status Nutritional status Physical activity Advanced directives List of other physicians Hospitalizations, surgeries, and ER visits in previous 12 months Vitals Screenings to include cognitive, depression, and falls Referrals and  appointments  No orders of the defined types were placed in this encounter.  In addition, I have reviewed and discussed with patient certain preventive protocols, quality metrics, and best practice recommendations. A written personalized care plan for preventive services as well as general preventive health recommendations were provided to patient.   Ardella FORBES Dawn, LPN   [id number]/[id number]   Return in 1 year (on 10/08/2025).  After  Visit Summary: (In Person-Declined) Patient declined AVS at this time.  Nurse Notes: none

## 2024-10-15 NOTE — Progress Notes (Signed)
   10/15/2024  Patient ID: Daniel [name], male   DOB: 1946-03-05, 78 y.o.   MRN: [id number]  Pharmacy Quality Measure Review  This patient is appearing on a report for being at risk of failing the adherence measure for cholesterol (statin) medications this calendar year.   Medication: Rosuvastatin  Last fill date: 11/15/25for 90 day supply  Insurance report was not up to date. No action needed at this time.   Jon VEAR Lindau, PharmD Clinical Pharmacist [phone]

## 2024-10-18 DIAGNOSIS — J3089 Other allergic rhinitis: Secondary | ICD-10-CM | POA: Diagnosis not present

## 2024-10-18 DIAGNOSIS — J301 Allergic rhinitis due to pollen: Secondary | ICD-10-CM | POA: Diagnosis not present

## 2024-10-18 DIAGNOSIS — J3081 Allergic rhinitis due to animal (cat) (dog) hair and dander: Secondary | ICD-10-CM | POA: Diagnosis not present

## 2024-10-30 DIAGNOSIS — J3089 Other allergic rhinitis: Secondary | ICD-10-CM | POA: Diagnosis not present

## 2024-10-30 DIAGNOSIS — J3081 Allergic rhinitis due to animal (cat) (dog) hair and dander: Secondary | ICD-10-CM | POA: Diagnosis not present

## 2024-10-30 DIAGNOSIS — J301 Allergic rhinitis due to pollen: Secondary | ICD-10-CM | POA: Diagnosis not present

## 2024-10-31 ENCOUNTER — Ambulatory Visit: Admitting: Medical

## 2024-10-31 ENCOUNTER — Encounter: Payer: Self-pay | Admitting: Medical

## 2024-10-31 ENCOUNTER — Other Ambulatory Visit: Payer: Self-pay | Admitting: Medical

## 2024-10-31 VITALS — BP 122/80 | HR 70 | Ht 70.0 in | Wt 197.4 lb

## 2024-10-31 DIAGNOSIS — E041 Nontoxic single thyroid nodule: Secondary | ICD-10-CM | POA: Diagnosis not present

## 2024-10-31 DIAGNOSIS — G5790 Unspecified mononeuropathy of unspecified lower limb: Secondary | ICD-10-CM

## 2024-10-31 DIAGNOSIS — I350 Nonrheumatic aortic (valve) stenosis: Secondary | ICD-10-CM

## 2024-10-31 DIAGNOSIS — N400 Enlarged prostate without lower urinary tract symptoms: Secondary | ICD-10-CM | POA: Diagnosis not present

## 2024-10-31 DIAGNOSIS — E785 Hyperlipidemia, unspecified: Secondary | ICD-10-CM

## 2024-10-31 DIAGNOSIS — R7301 Impaired fasting glucose: Secondary | ICD-10-CM | POA: Diagnosis not present

## 2024-10-31 DIAGNOSIS — N1831 Chronic kidney disease, stage 3a: Secondary | ICD-10-CM | POA: Diagnosis not present

## 2024-10-31 DIAGNOSIS — I6523 Occlusion and stenosis of bilateral carotid arteries: Secondary | ICD-10-CM | POA: Diagnosis not present

## 2024-10-31 DIAGNOSIS — M5416 Radiculopathy, lumbar region: Secondary | ICD-10-CM

## 2024-10-31 DIAGNOSIS — I10 Essential (primary) hypertension: Secondary | ICD-10-CM

## 2024-10-31 DIAGNOSIS — Z7189 Other specified counseling: Secondary | ICD-10-CM | POA: Diagnosis not present

## 2024-10-31 DIAGNOSIS — E042 Nontoxic multinodular goiter: Secondary | ICD-10-CM | POA: Diagnosis not present

## 2024-10-31 DIAGNOSIS — Z0000 Encounter for general adult medical examination without abnormal findings: Secondary | ICD-10-CM

## 2024-10-31 DIAGNOSIS — R2689 Other abnormalities of gait and mobility: Secondary | ICD-10-CM | POA: Insufficient documentation

## 2024-10-31 DIAGNOSIS — G8929 Other chronic pain: Secondary | ICD-10-CM

## 2024-10-31 LAB — LIPID PANEL

## 2024-10-31 NOTE — Progress Notes (Signed)
 Subjective:    Daniel [name] is a 78 y.o. male who presents for Preventative Services visit and chronic medical problems/med check visit.    Chief Complaint  Patient presents with   Annual Exam    Fasting cpe, no concerns    Primary Care Provider Kayliana Codd, Alm RAMAN, PA-C here for primary care   Current Health Care Team: Dr. Reyes Billing, orthopedics Dr. Dorn Lesches, cardiology [facility] dermatology, prior with Dr. Glean Burrow, dermatology Dr. Reyes Plough, GI prior, Dr. Norleen Kiang, GI 2021 Dentist, Dr. Dr. [place], Dr. Norleen [facility]    Concerns: Doing ok.  No particular issues.   He has a history of thyroid  nodules, which were unsuccessfully biopsied in 2025.  He has stage 3 kidney disease and follows dietary recommendations to limit foods high in potassium. He is interested in monitoring his kidney function through blood work.  He has a history of elevated PSA levels, which have been decreasing over recent tests. He sees a urologist annually for monitoring.  He maintains an active lifestyle, exercising five to six days a week with a routine that includes weightlifting and cardiovascular activities. He also engages in yard work and uses a actuary.  He notes some balance issues and back pain, which he attributes to aging. He describes a tendency to lean forward, which affects his balance. No falls reported.   Exercise Current exercise habits: daily, weight bearing and aerobic, calisthenics  Nutrition/Diet Current diet: healthy  Depression Screen    10/08/2024    9:25 AM  Depression screen PHQ 2/9  Decreased Interest 0  Down, Depressed, Hopeless 0  PHQ - 2 Score 0  Altered sleeping 0  Tired, decreased energy 0  Change in appetite 0  Feeling bad or failure about yourself  0  Trouble concentrating 0  Moving slowly or fidgety/restless 0  Suicidal thoughts 0  PHQ-9 Score 0  Difficult doing work/chores Not difficult at all      Fall Risk Screen    10/08/2024    9:35 AM 08/07/2023    9:38 AM 08/04/2022   10:39 AM 07/01/2022    9:59 AM 10/08/2021    2:31 PM  Fall Risk   Falls in the past year? 0 0 0 0 0  Number falls in past yr: 0 0 0 0 0  Injury with Fall? 0  0  0  0  0   Risk for fall due to : Medication side effect;Impaired balance/gait No Fall Risks No Fall Risks Medication side effect No Fall Risks  Follow up Falls prevention discussed;Falls evaluation completed Falls evaluation completed Falls evaluation completed  Falls evaluation completed;Education provided;Falls prevention discussed  Falls evaluation completed      Data saved with a previous flowsheet row definition    Gait Assessment: Normal gait observed yes   Advanced directives Does patient have a Health Care Power of Attorney? No Does patient have a Living Will? No    Past Medical History:  Diagnosis Date   Abdominal pain    with coffee   Abdominal ultrasound, abnormal 12/09/2009   fatty liver disease   Abnormal carotid ultrasound 03/01/2007   R no ICA stenosis. 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 Signe Pottier;   Arthritis    osteoarthritis back/spine   Basal cell carcinoma 06/15/2020   Left Breast (nodular)   Diverticulosis    Dyslipidemia    Dysplastic  nevus 03/01/1993   slight, left back and mid lower back, no treatment   Dysplastic nevus 03/04/2004   moderate-marked, low mid back, excision   Dysplastic nevus 06/14/2010   mild-mod, left hip, wider shave   Fatty liver    H/O CT scan of abdomen 03/20/2012   abdomen/pelvis - normal   H/O CT scan of chest 03/20/2012   normal   H/O diagnostic ultrasound 03/06/2011   Aortic ultrasound - no AAA identified   H/O echocardiogram 10/18/2011   transthoracic - mild concentric LVH, EF 55%, mild valvular aortic stenosis, LA mild to moderate dilation, otherwise normal echo; Dr. Maye   Heart murmur, aortic    cardiac  eval 09/2011, Dr. Court [facility]), mild aortic stenosis   Hx of echocardiogram 01/2024   with LVEF 65-70%, modeate LVH, moderate aortic valve stenosis, severely dilated coronary sinus.  Cardiology plans repeat in 1 year.   Hyperlipidemia    Hypertension    Inguinal hernia 2012   right   Normal cardiac stress test 03/02/2007   Myoview, EF 65%; Dr. Randine Bihari   SCC (squamous cell carcinoma) 09/17/2019   well diff, left sideburn, Cx3, 5FU   Statin intolerance    intolerance with Lipitor   Testicular pain 01/08/2010   Urology eval, Dr. Renda    Past Surgical History:  Procedure Laterality Date   COLONOSCOPY  10/21/2008   internal hemorrhoids, diverticulosis; repeat 10/2018; Dr. Norleen Kiang, [place]   HAND SURGERY  2004   left ganglion cyst removed    NOSE SURGERY  2005   obstruction, polyp and deviated septum; Dr. Carlie ROYS SURGERY  2002, 2008   bilat, RTC    Social History   Socioeconomic History   Marital status: Married    Spouse name: Not on file   Number of children: Not on file   Years of education: Not on file   Highest education level: Not on file  Occupational History   Occupation: theatre stage manager; part time [employer]    Employer: [employer]  Tobacco Use   Smoking status: Never   Smokeless tobacco: Never  Vaping Use   Vaping status: Never Used  Substance and Sexual Activity   Alcohol use: Not Currently    Alcohol/week: 2.0 standard drinks of alcohol    Types: 2 Cans of beer per week    Comment: non alcoholic beer   Drug use: No   Sexual activity: Yes  Other Topics Concern   Not on file  Social History Narrative   Married, no children, 2 dogs and a cat, exercise - aerobics,weight bearing.  07/2024   Social Drivers of Health   Financial Resource Strain: Low Risk  (10/08/2024)   Overall Financial Resource Strain (CARDIA)    Difficulty of Paying Living Expenses: Not hard at all  Food Insecurity: No Food Insecurity (10/08/2024)   Hunger  Vital Sign    Worried About Running Out of Food in the Last Year: Never true    Ran Out of Food in the Last Year: Never true  Transportation Needs: No Transportation Needs (10/08/2024)   PRAPARE - Administrator, Civil Service (Medical): No    Lack of Transportation (Non-Medical): No  Physical Activity: Insufficiently Active (10/31/2024)   Exercise Vital Sign    Days of Exercise per Week: 6 days    Minutes of Exercise per Session: 20 min  Stress: No Stress Concern Present (10/31/2024)   [employer] of Occupational Health - Occupational Stress Questionnaire  Feeling of Stress: Not at all  Recent Concern: Stress - Stress Concern Present (10/08/2024)   [employer] of Occupational Health - Occupational Stress Questionnaire    Feeling of Stress: To some extent  Social Connections: Moderately Isolated (10/08/2024)   Social Connection and Isolation Panel    Frequency of Communication with Friends and Family: More than three times a week    Frequency of Social Gatherings with Friends and Family: Twice a week    Attends Religious Services: Never    Database Administrator or Organizations: No    Attends Banker Meetings: Never    Marital Status: Married  Catering Manager Violence: Not At Risk (10/08/2024)   Humiliation, Afraid, Rape, and Kick questionnaire    Fear of Current or Ex-Partner: No    Emotionally Abused: No    Physically Abused: No    Sexually Abused: No    Family History  Problem Relation Age of Onset   Cancer Mother        breast   Dementia Mother 26   Kidney disease Father 73       kidney failure   Rheum arthritis Father    Diabetes Neg Hx    Heart disease Neg Hx    Stroke Neg Hx    Hypertension Neg Hx    Hyperlipidemia Neg Hx    Colon cancer Neg Hx    Colon polyps Neg Hx    Esophageal cancer Neg Hx    Rectal cancer Neg Hx    Stomach cancer Neg Hx      Current Outpatient Medications:    co-enzyme Q-10 30 MG capsule, Take  30 mg by mouth daily., Disp: , Rfl:    cyclobenzaprine  (FLEXERIL ) 10 MG tablet, Take 0.5-1 tablets (5-10 mg total) by mouth 2 (two) times daily as needed for muscle spasms., Disp: 60 tablet, Rfl: 0   cyclobenzaprine  (FLEXERIL ) 5 MG tablet, May take 5-10mg  once a day as needed for back spasms., Disp: 20 tablet, Rfl: 0   EPINEPHrine  0.3 mg/0.3 mL IJ SOAJ injection, Use as directed, Disp: 1 each, Rfl: 1   ezetimibe  (ZETIA ) 10 MG tablet, TAKE 1 TABLET(10 MG) BY MOUTH DAILY, Disp: 90 tablet, Rfl: 1   Glycerin, Laxative, (ADULT SUPPOSITORY RE), Place 1 each rectally as needed., Disp: , Rfl:    metoprolol  succinate (TOPROL -XL) 50 MG 24 hr tablet, Take 1 tablet (50 mg total) by mouth daily. May take a extra half tablet as needed palpitations, Disp: 90 tablet, Rfl: 1   Multiple Vitamins-Minerals (ONE-A-DAY MENS 50+ PO), Take 1 tablet by mouth daily., Disp: , Rfl:    NON FORMULARY, 1 each as needed., Disp: , Rfl:    polyethylene glycol powder (GLYCOLAX/MIRALAX) 17 GM/SCOOP powder, Take 17 g by mouth 3 (three) times a week., Disp: , Rfl:    Probiotic Product (PRO-BIOTIC BLEND PO), Take 1 capsule by mouth daily. Nature's Bounty, Disp: , Rfl:    rosuvastatin  (CRESTOR ) 10 MG tablet, TAKE 1 TABLET BY MOUTH EVERY OTHER DAY, Disp: 90 tablet, Rfl: 2   vitamin B-12 (CYANOCOBALAMIN) 100 MCG tablet, Take 100 mcg by mouth daily., Disp: , Rfl:   Allergies  Allergen Reactions   Codeine Nausea Only and Other (See Comments)    Dizziness, sweat and will make patient pass out.   Lipitor [Atorvastatin]     Elevated LFTs, no symptoms   Sulfa Antibiotics Rash    History reviewed: allergies, current medications, past family history, past medical history, past social history,  past surgical history and problem list  Chronic issues discussed: Dyslipidemia-compliant with Zetia  10mg  and statin Crestor  10mg  daily  Hypertension-compliant with Toprol -XL 50mg  daily  Chronic pain, history of radicular pain-he takes flexeril   periodically   Objective:    Biometrics BP 122/80   Pulse 70   Ht 5' 10 (1.778 m)   Wt 197 lb 6.4 oz (89.5 kg)   SpO2 100%   BMI 28.32 kg/m   Wt Readings from Last 3 Encounters:  10/31/24 197 lb 6.4 oz (89.5 kg)  10/08/24 199 lb 9.6 oz (90.5 kg)  07/31/24 198 lb 9.6 oz (90.1 kg)    Gen: wd, wn, nad Skin: Anterior torso with several scattered small cherry hemangiomas benign-appearing, no worrisome lesions, other scattered macules non worrisome appearing HEENT: normocephalic, sclerae anicteric, TMs pearly, nares patent, no discharge or erythema, pharynx normal Oral cavity: MMM, no lesions, teeth in good repair Neck: supple, no lymphadenopathy, no thyromegaly, no masses, no bruits Heart: 2/6 brief holosystolic murmur upper left and right border, otherwise RRR, normal S1, S2 Lungs: CTA bilaterally, no wheezes, rhonchi, or rales Abdomen: +bs, soft, non tender, non distended, no masses, no hepatomegaly, no splenomegaly Musculoskeletal: arms and legs nontender, no swelling, no obvious deformity Extremities: no edema, no cyanosis, no clubbing Pulses: 2+ symmetric, upper and lower extremities, normal cap refill Neurological: alert, oriented x 3, CN2-12 intact, strength normal upper extremities and lower extremities, sensation normal throughout, DTRs 2+ throughout, no cerebellar signs, gait normal Psychiatric: normal affect, behavior normal, pleasant  GU-deferred Rectal - deferred    Assessment:   Encounter Diagnoses  Name Primary?   Encounter for health maintenance examination in adult Yes   Dyslipidemia    Stage 3a chronic kidney disease (HCC)    Benign prostatic hyperplasia, unspecified whether lower urinary tract symptoms present    Essential hypertension, benign    Impaired fasting blood sugar    Lumbar radiculopathy    Neuropathy of lower extremity, unspecified laterality    Multinodular thyroid     Nonrheumatic aortic valve stenosis    Atherosclerosis of both carotid  arteries    Advanced directives, counseling/discussion    Thyroid  nodule       Plan:   This visit was a preventative care visit, also known as wellness visit or routine physical.   Topics typically include healthy lifestyle, diet, exercise, preventative care, vaccinations, sick and well care, proper use of emergency dept and after hours care, as well as other concerns.     Recommendations: Continue to return yearly for your annual wellness and preventative care visits.  This gives us  a chance to discuss healthy lifestyle, exercise, vaccinations, review your chart record, and perform screenings where appropriate.  I recommend you see your eye doctor yearly for routine vision care.  I recommend you see your dentist yearly for routine dental care including hygiene visits twice yearly.   Vaccination recommendations were reviewed Immunization History  Administered Date(s) Administered   Fluad Quad(high Dose 65+) 09/09/2024   HIB (PRP-OMP) 09/17/2018   Hepatitis B 05/17/1992, 12/17/1992, 06/16/2002   INFLUENZA, HIGH DOSE SEASONAL PF 09/01/2015, 09/13/2016, 09/13/2017, 09/17/2018, 08/28/2021   Influenza Split 09/08/2014, 08/29/2015   Influenza,inj,quad, With Preservative 08/13/2019   Influenza-Unspecified 09/05/2002, 09/02/2017, 09/17/2018   MMR 04/16/2012   PFIZER(Purple Top)SARS-COV-2 Vaccination 12/24/2019, 01/14/2020, 08/23/2020, 09/04/2020   PNEUMOCOCCAL CONJUGATE-20 05/13/2022   PPD Test 04/02/2012, 04/16/2012, 06/04/2013, 06/19/2013   Pfizer Covid-19 Vaccine Bivalent Booster 43yrs & up 08/15/2022   Pfizer(Comirnaty)Fall Seasonal Vaccine 12 years and older 09/28/2023, 08/28/2024  Pneumococcal Conjugate-13 11/19/2015, 08/19/2017   Pneumococcal Polysaccharide-23 03/14/2012, 09/13/2016, 08/25/2017, 08/15/2018, 09/24/2018, 09/23/2019, 09/04/2020, 09/03/2021   Pneumococcal-Unspecified 12/05/2001, 09/14/2018, 07/01/2019   Td 02/03/2003, 02/08/2021   Tdap 12/02/2009   Zoster  Recombinant(Shingrix) 09/09/2017, 10/05/2017   Zoster, Live 09/16/2008    Screening for cancer: Colon cancer screening: I reviewed your colonoscopy on file that is up to date from 2021. No planned repeat per GI  Skin cancer screening: Check your skin regularly for new changes, growing lesions, or other lesions of concern Come in for evaluation if you have skin lesions of concern.  Lung cancer screening: If you have a greater than 20 pack year history of tobacco use, then you may qualify for lung cancer screening with a chest CT scan.   Please call your insurance company to inquire about coverage for this test.  We currently don't have screenings for other cancers besides breast, cervical, colon, and lung cancers.  If you have a strong family history of cancer or have other cancer screening concerns, please let me know.    Bone health: Get at least 150 minutes of aerobic exercise weekly Get weight bearing exercise at least once weekly Bone density test:  A bone density test is an imaging test that uses a type of X-ray to measure the amount of calcium  and other minerals in your bones. The test may be used to diagnose or screen you for a condition that causes weak or thin bones (osteoporosis), predict your risk for a broken bone (fracture), or determine how well your osteoporosis treatment is working. The bone density test is recommended for females 65 and older, or females or males <65 if certain risk factors such as thyroid  disease, long term use of steroids such as for asthma or rheumatological issues, vitamin D  deficiency, estrogen deficiency, family history of osteoporosis, self or family history of fragility fracture in first degree relative.  Bone density normal 11/2021   Heart health: Get at least 150 minutes of aerobic exercise weekly Limit alcohol It is important to maintain a healthy blood pressure and healthy cholesterol numbers  Heart disease screening: Screening for heart  disease includes screening for blood pressure, fasting lipids, glucose/diabetes screening, BMI height to weight ratio, reviewed of smoking status, physical activity, and diet.    Goals include blood pressure 120/80 or less, maintaining a healthy lipid/cholesterol profile, preventing diabetes or keeping diabetes numbers under good control, not smoking or using tobacco products, exercising most days per week or at least 150 minutes per week of exercise, and eating healthy variety of fruits and vegetables, healthy oils, and avoiding unhealthy food choices like fried food, fast food, high sugar and high cholesterol foods.    Continue routine follow up with your cardiologist  Echocardiogram 01/2024 with LVEF 65-70%, modeate LVH, moderate aortic valve stenosis, severely dilated coronary sinus.  Cardiology plans repeat in 1 year.    Medical care options: I recommend you continue to seek care here first for routine care.  We try really hard to have available appointments Monday through Friday daytime hours for sick visits, acute visits, and physicals.  Urgent care should be used for after hours and weekends for significant issues that cannot wait till the next day.  The emergency department should be used for significant potentially life-threatening emergencies.  The emergency department is expensive, can often have long wait times for less significant concerns, so try to utilize primary care, urgent care, or telemedicine when possible to avoid unnecessary trips to the emergency department.  Virtual visits and telemedicine have been introduced since the pandemic started in 2020, and can be convenient ways to receive medical care.  We offer virtual appointments as well to assist you in a variety of options to seek medical care.   Advanced Directives: I recommend you consider completing a Health Care Power of Attorney and Living Will.   These documents respect your wishes and help alleviate burdens on your loved  ones if you were to become terminally ill or be in a position to need those documents enforced.    You can complete Advanced Directives yourself, have them notarized, then have copies made for our office, for you and for anybody you feel should have them in safe keeping.  Or, you can have an attorney prepare these documents.   If you haven't updated your Last Will and Testament in a while, it may be worthwhile having an attorney prepare these documents together and save on some costs.       Separate significant issues discussed: Thyroid  nodule - unsuccessful biopsy 2025.  Plan repeat thyroid  ultrasound 02/2025.    Hyperlipidemia-continue current meds patient, labs today, Zetia  10 mg daily and Crestor  10 mg daily  Hypertension-continue current medication, Toprol -XL 50 mg     Atherosclerosis of carotid arteries-consider updated ultrasound.  Last ultrasound 2021.  Aortic stenosis-I reviewed echocardiogram results from March 2025 from Dr. Anner, moderate aortic valve calcification with mild to moderate aortic stenosis, moderately thickened ventricle left ventricle  CKD 3 -  routine labs today  Neuropathy, spinal stenosis, chronic back pain - refer to PT  Balance concerns - refer to PT   Daniel [name] was seen today for annual exam.  Diagnoses and all orders for this visit:  Encounter for health maintenance examination in adult -     Renal Function Panel -     Hepatic function panel -     Lipid panel -     PSA -     Urinalysis, Routine w reflex microscopic -     Parathyroid hormone, intact (no Ca) -     VITAMIN D  25 Hydroxy (Vit-D Deficiency, Fractures) -     TSH + free T4 -     Hemoglobin A1c  Dyslipidemia -     Lipid panel  Stage 3a chronic kidney disease (HCC) -     Renal Function Panel -     Parathyroid hormone, intact (no Ca)  Benign prostatic hyperplasia, unspecified whether lower urinary tract symptoms present -     PSA  Essential hypertension, benign -     Renal  Function Panel -     Urinalysis, Routine w reflex microscopic  Impaired fasting blood sugar -     Hemoglobin A1c  Lumbar radiculopathy  Neuropathy of lower extremity, unspecified laterality  Multinodular thyroid   Nonrheumatic aortic valve stenosis  Atherosclerosis of both carotid arteries -     Lipid panel  Advanced directives, counseling/discussion  Thyroid  nodule -     TSH + free T4     F/u pending labs

## 2024-11-01 ENCOUNTER — Ambulatory Visit: Payer: Self-pay | Admitting: Medical

## 2024-11-01 ENCOUNTER — Other Ambulatory Visit: Payer: Self-pay | Admitting: Medical

## 2024-11-01 DIAGNOSIS — E785 Hyperlipidemia, unspecified: Secondary | ICD-10-CM

## 2024-11-01 LAB — VITAMIN D 25 HYDROXY (VIT D DEFICIENCY, FRACTURES): Vit D, 25-Hydroxy: 38.6 ng/mL (ref 30.0–100.0)

## 2024-11-01 LAB — URINALYSIS, ROUTINE W REFLEX MICROSCOPIC
Bilirubin, UA: NEGATIVE
Glucose, UA: NEGATIVE
Ketones, UA: NEGATIVE
Leukocytes,UA: NEGATIVE
Nitrite, UA: NEGATIVE
Protein,UA: NEGATIVE
RBC, UA: NEGATIVE
Specific Gravity, UA: 1.013 (ref 1.005–1.030)
Urobilinogen, Ur: 0.2 mg/dL (ref 0.2–1.0)
pH, UA: 7.5 (ref 5.0–7.5)

## 2024-11-01 LAB — PSA: Prostate Specific Ag, Serum: 3.9 ng/mL (ref 0.0–4.0)

## 2024-11-01 LAB — RENAL FUNCTION PANEL
Albumin: 4.5 g/dL (ref 3.8–4.8)
BUN/Creatinine Ratio: 15 (ref 10–24)
BUN: 18 mg/dL (ref 8–27)
CO2: 27 mmol/L (ref 20–29)
Calcium: 9.9 mg/dL (ref 8.6–10.2)
Chloride: 102 mmol/L (ref 96–106)
Creatinine, Ser: 1.22 mg/dL (ref 0.76–1.27)
Glucose: 99 mg/dL (ref 70–99)
Phosphorus: 3.5 mg/dL (ref 2.8–4.1)
Potassium: 4.1 mmol/L (ref 3.5–5.2)
Sodium: 141 mmol/L (ref 134–144)
eGFR: 61 mL/min/1.73 (ref >59)

## 2024-11-01 LAB — LIPID PANEL
Cholesterol, Total: 164 mg/dL (ref 100–199)
HDL: 53 mg/dL (ref >39)
LDL CALC COMMENT:: 3.1 ratio (ref 0.0–5.0)
LDL Chol Calc (NIH): 92 mg/dL (ref 0–99)
Triglycerides: 105 mg/dL (ref 0–149)
VLDL Cholesterol Cal: 19 mg/dL (ref 5–40)

## 2024-11-01 LAB — HEPATIC FUNCTION PANEL
ALT: 36 IU/L (ref 0–44)
AST: 30 IU/L (ref 0–40)
Alkaline Phosphatase: 63 IU/L (ref 47–123)
Bilirubin Total: 0.6 mg/dL (ref 0.0–1.2)
Bilirubin, Direct: 0.2 mg/dL (ref 0.00–0.40)
Total Protein: 7.3 g/dL (ref 6.0–8.5)

## 2024-11-01 LAB — PARATHYROID HORMONE, INTACT (NO CA): PTH: 35 pg/mL (ref 15–65)

## 2024-11-01 LAB — TSH+FREE T4
Free T4: 1.1 ng/dL (ref 0.82–1.77)
TSH: 1.7 u[IU]/mL (ref 0.450–4.500)

## 2024-11-01 LAB — HEMOGLOBIN A1C
Est. average glucose Bld gHb Est-mCnc: 111 mg/dL
Hgb A1c MFr Bld: 5.5 % (ref 4.8–5.6)

## 2024-11-01 MED ORDER — EZETIMIBE 10 MG PO TABS: ORAL_TABLET | ORAL | 3 refills | Status: AC

## 2024-11-01 MED ORDER — ROSUVASTATIN CALCIUM 10 MG PO TABS: 10.0000 mg | ORAL_TABLET | ORAL | 3 refills | Status: AC

## 2024-11-01 MED ORDER — METOPROLOL SUCCINATE ER 50 MG PO TB24: 50.0000 mg | ORAL_TABLET | Freq: Every day | ORAL | 3 refills | Status: AC

## 2024-11-01 NOTE — Progress Notes (Signed)
 Results thru my chart

## 2025-01-23 ENCOUNTER — Ambulatory Visit (HOSPITAL_COMMUNITY)

## 2025-10-14 ENCOUNTER — Ambulatory Visit

## 2025-11-03 ENCOUNTER — Encounter: Admitting: Medical
